# Patient Record
Sex: Female | Born: 1965 | Race: White | Hispanic: No | Marital: Married
Health system: Midwestern US, Community
[De-identification: ages and names within clinical notes are randomized; demographics above are authoritative.]

## PROBLEM LIST (undated history)

## (undated) ENCOUNTER — Emergency Department (HOSPITAL_BASED_OUTPATIENT_CLINIC_OR_DEPARTMENT_OTHER): Admission: EM | Discharge status: Against Medical Advice | Payer: BC Managed Care – PPO

## (undated) DIAGNOSIS — Z170 Estrogen receptor positive status [ER+]: Secondary | ICD-10-CM

## (undated) DIAGNOSIS — M79671 Pain in right foot: Secondary | ICD-10-CM

## (undated) DIAGNOSIS — C50111 Malignant neoplasm of central portion of right female breast: Secondary | ICD-10-CM

## (undated) DIAGNOSIS — M79672 Pain in left foot: Principal | ICD-10-CM

## (undated) DIAGNOSIS — Z1211 Encounter for screening for malignant neoplasm of colon: Secondary | ICD-10-CM

## (undated) DIAGNOSIS — Z79811 Long term (current) use of aromatase inhibitors: Secondary | ICD-10-CM

## (undated) DIAGNOSIS — Z0000 Encounter for general adult medical examination without abnormal findings: Secondary | ICD-10-CM

## (undated) DIAGNOSIS — C50911 Malignant neoplasm of unspecified site of right female breast: Principal | ICD-10-CM

## (undated) DIAGNOSIS — E034 Atrophy of thyroid (acquired): Secondary | ICD-10-CM

## (undated) DIAGNOSIS — N63 Unspecified lump in unspecified breast: Secondary | ICD-10-CM

## (undated) DIAGNOSIS — R059 Cough, unspecified: Secondary | ICD-10-CM

## (undated) DIAGNOSIS — N950 Postmenopausal bleeding: Secondary | ICD-10-CM

## (undated) DIAGNOSIS — L0291 Cutaneous abscess, unspecified: Secondary | ICD-10-CM

## (undated) DIAGNOSIS — K219 Gastro-esophageal reflux disease without esophagitis: Secondary | ICD-10-CM

## (undated) DIAGNOSIS — E782 Mixed hyperlipidemia: Secondary | ICD-10-CM

## (undated) DIAGNOSIS — R0681 Apnea, not elsewhere classified: Secondary | ICD-10-CM

## (undated) DIAGNOSIS — F411 Generalized anxiety disorder: Secondary | ICD-10-CM

## (undated) HISTORY — DX: Apnea, not elsewhere classified: R06.81

## (undated) HISTORY — DX: Generalized anxiety disorder: F41.1

## (undated) HISTORY — DX: Mixed hyperlipidemia: E78.2

## (undated) HISTORY — PX: HERNIA REPAIR: SHX51

## (undated) HISTORY — DX: Gastro-esophageal reflux disease without esophagitis: K21.9

## (undated) MED FILL — TAMOXIFEN CITRATE 20MG TABS: 20 MG | 30 days supply | Qty: 30 | Fill #2 | Status: AC

## (undated) MED FILL — ANASTROZOLE 1MG TABS: 1 MG | 30 days supply | Qty: 30 | Fill #1 | Status: AC

## (undated) MED FILL — TAMOXIFEN CITRATE 20MG TABS: 20 MG | 90 days supply | Qty: 90 | Fill #3 | Status: AC

## (undated) MED FILL — TAMOXIFEN CITRATE 20MG TABS: 20 MG | 90 days supply | Qty: 90 | Fill #1 | Status: AC

## (undated) MED FILL — TAMOXIFEN CITRATE 20MG TABS: 20 MG | 90 days supply | Qty: 90 | Fill #0 | Status: AC

## (undated) MED FILL — TAMOXIFEN CITRATE 20MG TABS: 20 MG | 90 days supply | Qty: 90 | Fill #2 | Status: AC

## (undated) MED FILL — ANASTROZOLE 1MG TABS: 1 MG | 30 days supply | Qty: 30 | Fill #0 | Status: AC

---

## 2010-05-24 ENCOUNTER — Emergency Department (HOSPITAL_COMMUNITY)
Admission: EM | Admit: 2010-05-24 | Discharge: 2010-05-24 | Payer: Self-pay | Source: Home / Self Care | Admitting: Emergency Medicine

## 2010-06-20 ENCOUNTER — Emergency Department (HOSPITAL_BASED_OUTPATIENT_CLINIC_OR_DEPARTMENT_OTHER)
Admission: EM | Admit: 2010-06-20 | Discharge: 2010-06-20 | Payer: Self-pay | Source: Home / Self Care | Admitting: Emergency Medicine

## 2010-06-28 LAB — CBC
HCT: 36.1 % (ref 36.0–46.0)
Hemoglobin: 12.5 g/dL (ref 12.0–15.0)
MCH: 30 pg (ref 26.0–34.0)
MCHC: 34.6 g/dL (ref 30.0–36.0)
MCV: 86.6 fL (ref 78.0–100.0)
Platelets: 224 10*3/uL (ref 150–400)
RBC: 4.17 MIL/uL (ref 3.87–5.11)
RDW: 12.7 % (ref 11.5–15.5)
WBC: 5.1 10*3/uL (ref 4.0–10.5)

## 2010-06-28 LAB — COMPREHENSIVE METABOLIC PANEL
ALT: 26 U/L (ref 0–35)
AST: 28 U/L (ref 0–37)
Albumin: 4.2 g/dL (ref 3.5–5.2)
Alkaline Phosphatase: 60 U/L (ref 39–117)
BUN: 11 mg/dL (ref 6–23)
CO2: 24 mEq/L (ref 19–32)
Calcium: 9.2 mg/dL (ref 8.4–10.5)
Chloride: 107 mEq/L (ref 96–112)
Creatinine, Ser: 0.8 mg/dL (ref 0.4–1.2)
GFR calc Af Amer: >60 mL/min (ref >60)
GFR calc non Af Amer: >60 mL/min (ref >60)
Glucose, Bld: 83 mg/dL (ref 70–99)
Potassium: 3.7 mEq/L (ref 3.5–5.1)
Sodium: 141 mEq/L (ref 135–145)
Total Bilirubin: 0.6 mg/dL (ref 0.3–1.2)
Total Protein: 8 g/dL (ref 6.0–8.3)

## 2010-06-28 LAB — POCT CARDIAC MARKERS
CKMB, poc: <1 ng/mL — ABNORMAL LOW (ref 1.0–8.0)
Myoglobin, poc: 50.5 ng/mL (ref 12–200)
Troponin i, poc: <0.05 ng/mL (ref 0.00–0.09)

## 2010-06-28 LAB — D-DIMER, QUANTITATIVE: D-Dimer, Quant: 0.35 ug/mL-FEU (ref 0.00–0.48)

## 2010-08-24 LAB — URINALYSIS, ROUTINE W REFLEX MICROSCOPIC
Bilirubin Urine: NEGATIVE
Glucose, UA: NEGATIVE mg/dL
Hgb urine dipstick: NEGATIVE
Nitrite: NEGATIVE
Protein, ur: NEGATIVE mg/dL
Specific Gravity, Urine: 1.027 (ref 1.005–1.030)
Urobilinogen, UA: 0.2 mg/dL (ref 0.0–1.0)
pH: 6 (ref 5.0–8.0)

## 2010-08-24 LAB — POCT PREGNANCY, URINE: Preg Test, Ur: NEGATIVE

## 2011-12-08 IMAGING — CR DG CHEST 1V PORT
1 series · 1 of 1 positions shown · non-contrast
Comparison: None.

CLINICAL DATA: 44-year-old female with chest pain, chest tightness.

PORTABLE CHEST - 1 VIEW

[view not recorded]
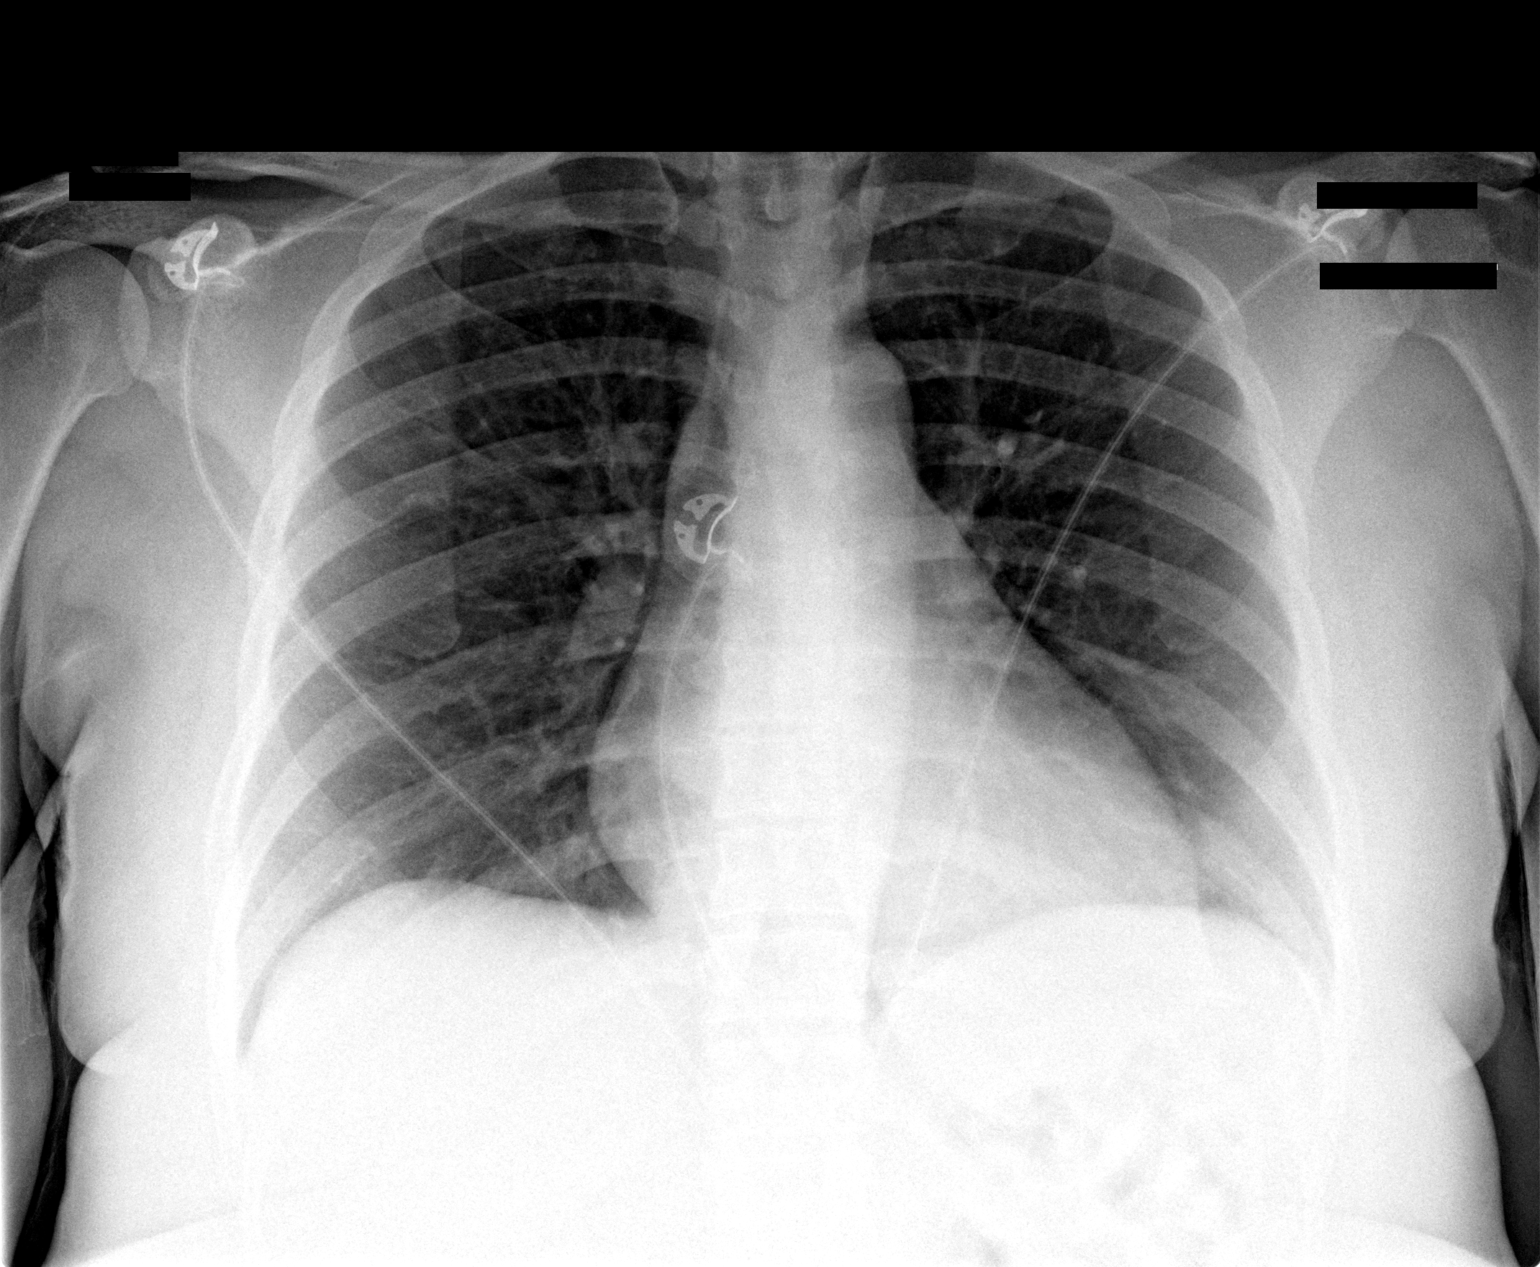

[1 of 1 positions shown; findings below may reference images not displayed]

FINDINGS: Portable upright AP view 7949 hours.  Normal lung
volumes. Normal cardiac size and mediastinal contours.  Visualized
tracheal air column is within normal limits.  Allowing for portable
technique, the lungs are clear.  No pneumothorax or effusion.
IMPRESSION: Negative, no acute cardiopulmonary abnormality.

## 2012-09-19 ENCOUNTER — Emergency Department: Payer: Self-pay | Admitting: Emergency Medicine

## 2012-09-20 LAB — CBC
HCT: 37.8 %
HGB: 13 g/dL
MCH: 31 pg
MCHC: 34.3 g/dL
MCV: 90 fL
Platelet: 229 x10 3/mm 3
RBC: 4.19 X10 6/mm 3
RDW: 13 %
WBC: 9.1 x10 3/mm 3

## 2012-09-23 ENCOUNTER — Emergency Department (HOSPITAL_BASED_OUTPATIENT_CLINIC_OR_DEPARTMENT_OTHER)
Admission: EM | Admit: 2012-09-23 | Discharge: 2012-09-23 | Disposition: A | Discharge status: Home / Self Care | Payer: BC Managed Care – PPO | Attending: Emergency Medicine | Admitting: Emergency Medicine

## 2012-09-23 ENCOUNTER — Encounter (HOSPITAL_BASED_OUTPATIENT_CLINIC_OR_DEPARTMENT_OTHER): Payer: Self-pay

## 2012-09-23 DIAGNOSIS — F17200 Nicotine dependence, unspecified, uncomplicated: Secondary | ICD-10-CM | POA: Insufficient documentation

## 2012-09-23 DIAGNOSIS — R5381 Other malaise: Secondary | ICD-10-CM | POA: Insufficient documentation

## 2012-09-23 DIAGNOSIS — L0390 Cellulitis, unspecified: Secondary | ICD-10-CM

## 2012-09-23 DIAGNOSIS — R5383 Other fatigue: Secondary | ICD-10-CM | POA: Insufficient documentation

## 2012-09-23 DIAGNOSIS — N764 Abscess of vulva: Secondary | ICD-10-CM | POA: Insufficient documentation

## 2012-09-23 HISTORY — DX: Cutaneous abscess, unspecified: L02.91

## 2012-09-23 LAB — BASIC METABOLIC PANEL
BUN: 12 mg/dL (ref 6–23)
CO2: 28 mEq/L (ref 19–32)
Calcium: 9.4 mg/dL (ref 8.4–10.5)
Chloride: 103 mEq/L (ref 96–112)
Creatinine, Ser: 0.9 mg/dL (ref 0.50–1.10)
GFR calc Af Amer: 87 mL/min — ABNORMAL LOW (ref >90)
GFR calc non Af Amer: 75 mL/min — ABNORMAL LOW (ref >90)
Glucose, Bld: 86 mg/dL (ref 70–99)
Potassium: 4 mEq/L (ref 3.5–5.1)
Sodium: 139 mEq/L (ref 135–145)

## 2012-09-23 LAB — CBC WITH DIFFERENTIAL/PLATELET
Basophils Absolute: 0 10*3/uL (ref 0.0–0.1)
Basophils Relative: 1 % (ref 0–1)
Eosinophils Absolute: 0.1 10*3/uL (ref 0.0–0.7)
Eosinophils Relative: 3 % (ref 0–5)
HCT: 38.9 % (ref 36.0–46.0)
Hemoglobin: 13.6 g/dL (ref 12.0–15.0)
Lymphocytes Relative: 29 % (ref 12–46)
Lymphs Abs: 1.2 10*3/uL (ref 0.7–4.0)
MCH: 30.8 pg (ref 26.0–34.0)
MCHC: 35 g/dL (ref 30.0–36.0)
MCV: 88 fL (ref 78.0–100.0)
Monocytes Absolute: 0.4 10*3/uL (ref 0.1–1.0)
Monocytes Relative: 10 % (ref 3–12)
Neutro Abs: 2.4 10*3/uL (ref 1.7–7.7)
Neutrophils Relative %: 58 % (ref 43–77)
Platelets: 260 10*3/uL (ref 150–400)
RBC: 4.42 MIL/uL (ref 3.87–5.11)
RDW: 12.4 % (ref 11.5–15.5)
WBC: 4.1 10*3/uL (ref 4.0–10.5)

## 2012-09-23 MED ORDER — SULFAMETHOXAZOLE-TRIMETHOPRIM 800-160 MG PO TABS: 1.0000 | ORAL_TABLET | Freq: Two times a day (BID) | ORAL | Status: DC

## 2012-09-23 NOTE — ED Provider Notes (Signed)
History     CSN: [id number]  Arrival date & time 09/23/12  1151   First MD Initiated Contact with Patient 09/23/12 1205      Chief Complaint  Patient presents with  . Abscess    (Consider location/radiation/quality/duration/timing/severity/associated sxs/prior treatment) HPI Comments: Patient is a 47 year old female who presents with left labial swelling for the past 2 days. Patient reports a history of a labial abscess that was drained 5 days ago at [facility]. Patient's reports having packing in the wound which she removed. She thinks there may be a piece in the wound because she feels a "hard knot" where the abscess was drained. Symptoms started gradually and progressively worsened since the onset. Patient denies pain. She reports some green drainage from the incision area. She reports associated weakness. Patient denies fever, abdominal pain, urinary symptoms. Patient did not get antibiotics at her initial visit.    Past Medical History  Diagnosis Date  . Abscess     to genitals    Past Surgical History  Procedure Laterality Date  . Hernia repair      History reviewed. No pertinent family history.  History  Substance Use Topics  . Smoking status: Current Every Day Smoker -- 0.50 packs/day    Types: Cigarettes  . Smokeless tobacco: Never Used  . Alcohol Use: Yes    OB History   Grav Para Term Preterm Abortions TAB SAB Ect Mult Living                  Review of Systems  Skin: Positive for wound.  All other systems reviewed and are negative.    Allergies  Penicillins  Home Medications  No current outpatient prescriptions on file.  BP 140/91  Pulse 88  Temp(Src) 98.6 F (37 C) (Oral)  Resp 18  Ht 5\' 6"  (1.676 m)  Wt 210 lb (95.255 kg)  BMI 33.91 kg/m2  SpO2 100%  LMP 09/12/2012  Physical Exam  Nursing note and vitals reviewed. Constitutional: She appears well-developed and well-nourished. No distress.  HENT:  Head: Normocephalic and atraumatic.   Eyes: Conjunctivae are normal.  Neck: Normal range of motion.  Cardiovascular: Normal rate and regular rhythm.  Exam reveals no gallop and no friction rub.   No murmur heard. Pulmonary/Chest: Effort normal and breath sounds normal. She has no wheezes. She has no rales. She exhibits no tenderness.  Abdominal: Soft. She exhibits no distension. There is no tenderness. There is no rebound and no guarding.  Genitourinary:  Area of induration to left labia majora. Incision from previous I&D visible and without drainage from site. Mild tenderness to palpation of area of induration.   Musculoskeletal: Normal range of motion.  Neurological: She is alert.  Speech is goal-oriented. Moves limbs without ataxia.   Skin: Skin is warm and dry.  Psychiatric: She has a normal mood and affect. Her behavior is normal.    ED Course  Procedures (including critical care time)  Labs Reviewed  BASIC METABOLIC PANEL - Abnormal; Notable for the following:    GFR calc non Af Amer 75 (*)    GFR calc Af Amer 87 (*)    All other components within normal limits  CBC WITH DIFFERENTIAL   No results found.   1. Cellulitis   2. Fatigue       MDM  12:24 PM Patient likely has cellulitis around the previous abscess that was drained 5 days ago. Labs pending for patient's fatigue.   1:12 PM Labs  unremarkable. Patient will be discharged with out further evaluation. Vitals stable and patient afebrile. Patient instructed to return to the ED with worsening or concerning symptoms.      Emilia Beck, PA-C 09/23/12 1320

## 2012-09-23 NOTE — ED Notes (Signed)
Pt states she has abscess to genitals and states that she had packing placed and that she was told to take some out everyday, but thinks that some remains in place.  Pt states that there is green drainage coming from the area.  C/o generalized weakness and malaise.

## 2012-09-25 NOTE — ED Provider Notes (Signed)
Medical screening examination/treatment/procedure(s) were performed by non-physician practitioner and as supervising physician I was immediately available for consultation/collaboration.   Gwyneth Sprout, MD 09/25/12 2129

## 2013-05-24 ENCOUNTER — Encounter (HOSPITAL_BASED_OUTPATIENT_CLINIC_OR_DEPARTMENT_OTHER): Payer: Self-pay | Admitting: Emergency Medicine

## 2013-05-24 ENCOUNTER — Emergency Department (HOSPITAL_BASED_OUTPATIENT_CLINIC_OR_DEPARTMENT_OTHER)
Admission: EM | Admit: 2013-05-24 | Discharge: 2013-05-24 | Disposition: A | Discharge status: Home / Self Care | Payer: BC Managed Care – PPO | Attending: Emergency Medicine | Admitting: Emergency Medicine

## 2013-05-24 DIAGNOSIS — K645 Perianal venous thrombosis: Secondary | ICD-10-CM | POA: Insufficient documentation

## 2013-05-24 DIAGNOSIS — Z880 Allergy status to penicillin: Secondary | ICD-10-CM | POA: Insufficient documentation

## 2013-05-24 DIAGNOSIS — Z8742 Personal history of other diseases of the female genital tract: Secondary | ICD-10-CM | POA: Insufficient documentation

## 2013-05-24 DIAGNOSIS — K649 Unspecified hemorrhoids: Secondary | ICD-10-CM

## 2013-05-24 DIAGNOSIS — Z792 Long term (current) use of antibiotics: Secondary | ICD-10-CM | POA: Insufficient documentation

## 2013-05-24 DIAGNOSIS — F17200 Nicotine dependence, unspecified, uncomplicated: Secondary | ICD-10-CM | POA: Insufficient documentation

## 2013-05-24 MED ORDER — HYDROCORTISONE ACETATE 25 MG RE SUPP: 25.0000 mg | Freq: Two times a day (BID) | RECTAL | Status: DC

## 2013-05-24 NOTE — ED Provider Notes (Signed)
CSN: [id number]     Arrival date & time 05/24/13  1144 History   First MD Initiated Contact with Patient 05/24/13 1236     Chief Complaint  Patient presents with  . Hemorrhoids   (Consider location/radiation/quality/duration/timing/severity/associated sxs/prior Treatment) HPI Comments: Ms. Schexnider is a 47 year old woman with a PMH of hemorrhoids since the birth of her first child over 20 years ago.  She normally manages them conservatively at home.  However, recently she says she has been unable to push them back in.  She notes an episode of straining a few days ago and has been experiencing anal itching and pain since then.  OTC medications and sitz baths have not helped.  She called a surgeon to set up an appointment for evaluation.  However, she was informed that she would need a referral by her PCP.  She does not have a PCP so she came to the ED for a referral.   Past Medical History  Diagnosis Date  . Abscess     to genitals   Past Surgical History  Procedure Laterality Date  . Hernia repair     History reviewed. No pertinent family history. History  Substance Use Topics  . Smoking status: Current Every Day Smoker -- 0.50 packs/day    Types: Cigarettes  . Smokeless tobacco: Never Used  . Alcohol Use: No   OB History   Grav Para Term Preterm Abortions TAB SAB Ect Mult Living                 Review of Systems  Constitutional: Negative for appetite change and fatigue.  Respiratory: Negative for shortness of breath.   Cardiovascular: Negative for chest pain.  Gastrointestinal: Negative for nausea, vomiting, abdominal pain, diarrhea and blood in stool.       Occasional blood on toilet paper  Genitourinary: Negative for dysuria.  Neurological: Negative for headaches.    Allergies  Penicillins  Home Medications   Current Outpatient Rx  Name  Route  Sig  Dispense  Refill  . sulfamethoxazole-trimethoprim (SEPTRA DS) 800-160 MG per tablet   Oral   Take 1 tablet by mouth  every 12 (twelve) hours.   20 tablet   0    BP 132/85  Pulse 77  Temp(Src) 98.3 F (36.8 C) (Oral)  Resp 16  Ht 5\' 6"  (1.676 m)  Wt 200 lb (90.719 kg)  BMI 32.30 kg/m2  SpO2 100%  LMP 05/15/2013 Physical Exam  Constitutional: She is oriented to person, place, and time. She appears well-developed and well-nourished. No distress.  HENT:  Head: Normocephalic and atraumatic.  Mouth/Throat: Oropharynx is clear and moist. No oropharyngeal exudate.  Eyes: EOM are normal. Pupils are equal, round, and reactive to light.  Neck: Neck supple.  Cardiovascular: Normal rate, regular rhythm and normal heart sounds.   Pulmonary/Chest: Effort normal and breath sounds normal. No respiratory distress. She has no wheezes. She has no rales.  Abdominal: Soft. Bowel sounds are normal. She exhibits no distension. There is no tenderness.  Genitourinary:  + thrombosed external hemorrhoid  Musculoskeletal: Normal range of motion.  Neurological: She is alert and oriented to person, place, and time.  Skin: Skin is warm. She is not diaphoretic.  Psychiatric: She has a normal mood and affect. Her behavior is normal.    ED Course  Procedures (including critical care time) Labs Review Labs Reviewed - No data to display Imaging Review No results found.  EKG Interpretation   None  MDM  47 year old with PMH of hemorrhoids presenting with complaint of hemorrhoids.  She is usually able to manage on her own but would like definitive surgical treatment.  She has called a Careers adviser but they will not see her without a referral.  She has no PCP so she has come to the ED for evaluation and referral.  Thrombosed external hemorrhoid evident on exam.  Patient declines clot removal in the ED and would prefer referral to specialist for definitive treatment.  She is in stable condition for discharge home with rx for hydrocortisone suppositories (for no more than 1 week) and she can continue sitz baths for symptom  relief as well.  I attempted to reach the surgeon's office where she would like to be referred.  The office was closed so I have included the surgeon's contact information on her discharge paperwork.  The patient will call next week for appointment.    Evelena Peat, DO 05/24/13 1605

## 2013-05-24 NOTE — ED Notes (Signed)
Pt c/o painful Hemorids x 2 days

## 2013-05-30 ENCOUNTER — Ambulatory Visit (INDEPENDENT_AMBULATORY_CARE_PROVIDER_SITE_OTHER): Payer: BC Managed Care – PPO | Admitting: Surgery

## 2013-05-30 ENCOUNTER — Encounter (INDEPENDENT_AMBULATORY_CARE_PROVIDER_SITE_OTHER): Payer: Self-pay | Admitting: Surgery

## 2013-05-30 VITALS — BP 142/86 | HR 71 | Temp 97.3°F | Resp 16 | Ht 66.0 in | Wt 217.6 lb

## 2013-05-30 DIAGNOSIS — K649 Unspecified hemorrhoids: Secondary | ICD-10-CM

## 2013-05-30 MED ORDER — HYDROCORTISONE ACE-PRAMOXINE 1-1 % RE FOAM: 1.0000 | Freq: Two times a day (BID) | RECTAL | Status: DC

## 2013-05-30 NOTE — Patient Instructions (Signed)
Hemorrhoids Hemorrhoids are swollen veins around the rectum or anus. There are two types of hemorrhoids:   Internal hemorrhoids. These occur in the veins just inside the rectum. They may poke through to the outside and become irritated and painful.  External hemorrhoids. These occur in the veins outside the anus and can be felt as a painful swelling or hard lump near the anus. CAUSES  Pregnancy.   Obesity.   Constipation or diarrhea.   Straining to have a bowel movement.   Sitting for long periods on the toilet.  Heavy lifting or other activity that caused you to strain.  Anal intercourse. SYMPTOMS   Pain.   Anal itching or irritation.   Rectal bleeding.   Fecal leakage.   Anal swelling.   One or more lumps around the anus.  DIAGNOSIS  Your caregiver may be able to diagnose hemorrhoids by visual examination. Other examinations or tests that may be performed include:   Examination of the rectal area with a gloved hand (digital rectal exam).   Examination of anal canal using a small tube (scope).   A blood test if you have lost a significant amount of blood.  A test to look inside the colon (sigmoidoscopy or colonoscopy). TREATMENT Most hemorrhoids can be treated at home. However, if symptoms do not seem to be getting better or if you have a lot of rectal bleeding, your caregiver may perform a procedure to help make the hemorrhoids get smaller or remove them completely. Possible treatments include:   Placing a rubber band at the base of the hemorrhoid to cut off the circulation (rubber band ligation).   Injecting a chemical to shrink the hemorrhoid (sclerotherapy).   Using a tool to burn the hemorrhoid (infrared light therapy).   Surgically removing the hemorrhoid (hemorrhoidectomy).   Stapling the hemorrhoid to block blood flow to the tissue (hemorrhoid stapling).  HOME CARE INSTRUCTIONS   Eat foods with fiber, such as whole grains, beans,  nuts, fruits, and vegetables. Ask your doctor about taking products with added fiber in them (fibersupplements).  Increase fluid intake. Drink enough water and fluids to keep your urine clear or pale yellow.   Exercise regularly.   Go to the bathroom when you have the urge to have a bowel movement. Do not wait.   Avoid straining to have bowel movements.   Keep the anal area dry and clean. Use wet toilet paper or moist towelettes after a bowel movement.   Medicated creams and suppositories may be used or applied as directed.   Only take over-the-counter or prescription medicines as directed by your caregiver.   Take warm sitz baths for 15 20 minutes, 3 4 times a day to ease pain and discomfort.   Place ice packs on the hemorrhoids if they are tender and swollen. Using ice packs between sitz baths may be helpful.   Put ice in a plastic bag.   Place a towel between your skin and the bag.   Leave the ice on for 15 20 minutes, 3 4 times a day.   Do not use a donut-shaped pillow or sit on the toilet for long periods. This increases blood pooling and pain.  SEEK MEDICAL CARE IF:  You have increasing pain and swelling that is not controlled by treatment or medicine.  You have uncontrolled bleeding.  You have difficulty or you are unable to have a bowel movement.  You have pain or inflammation outside the area of the hemorrhoids. MAKE SURE YOU:    Understand these instructions.  Will watch your condition.  Will get help right away if you are not doing well or get worse. Document Released: 05/27/2000 Document Revised: 05/16/2012 Document Reviewed: 04/03/2012 Exit[employer] Patient Information 2014 [employer].  

## 2013-05-30 NOTE — ED Provider Notes (Signed)
I saw and evaluated the patient, reviewed the resident's note and I agree with the findings and plan.   .Face to face Exam:  General:  Awake HEENT:  Atraumatic Resp:  Normal effort Abd:  Nondistended Neuro:No focal weakness   Offered to I&D thrombosed external hemorrhoid, but patient declined the offer.  Nelia Shi, MD 05/30/13 [phone]

## 2013-05-30 NOTE — Progress Notes (Signed)
Patient ID: Caitlin Butler, female   DOB: 10-Jun-1966, 47 y.o.   MRN: [id number]  Chief Complaint  Patient presents with  . Rectal Problems    evaluate thrombosed hems    HPI Caitlin Butler is a 47 y.o. female.  The patient's request of Dr. Radford Pax for symptomatic hemorrhoid disease. Patient has 20 year history of hemorrhoid disease since the birth of her last child. Patient having more prolapse and discomfort and sought care in the emergency room. He was told she had a thrombosed hemorrhoid. Patient with history of bleeding and itching. HPI  Past Medical History  Diagnosis Date  . Abscess     to genitals    Past Surgical History  Procedure Laterality Date  . Hernia repair      No family history on file.  Social History History  Substance Use Topics  . Smoking status: Current Every Day Smoker -- 0.50 packs/day    Types: Cigarettes  . Smokeless tobacco: Never Used  . Alcohol Use: No    Allergies  Allergen Reactions  . Penicillins     Childhood rxn     Current Outpatient Prescriptions  Medication Sig Dispense Refill  . hydrocortisone-pramoxine (PROCTOFOAM HC) rectal foam Place 1 applicator rectally 2 (two) times daily.  10 g  0   No current facility-administered medications for this visit.    Review of Systems Review of Systems  Constitutional: Negative for fever, chills and unexpected weight change.  HENT: Negative for congestion, hearing loss, sore throat, trouble swallowing and voice change.   Eyes: Negative for visual disturbance.  Respiratory: Negative for cough and wheezing.   Cardiovascular: Negative for chest pain, palpitations and leg swelling.  Gastrointestinal: Positive for anal bleeding and rectal pain. Negative for nausea, vomiting, abdominal pain, diarrhea, constipation, blood in stool and abdominal distention.  Genitourinary: Negative for hematuria, vaginal bleeding and difficulty urinating.  Musculoskeletal: Negative for arthralgias.  Skin: Negative for  rash and wound.  Neurological: Negative for seizures, syncope and headaches.  Hematological: Negative for adenopathy. Does not bruise/bleed easily.  Psychiatric/Behavioral: Negative for confusion.    Blood pressure 142/86, pulse 71, temperature 97.3 F (36.3 C), temperature source Temporal, resp. rate 16, height 5\' 6"  (1.676 m), weight 217 lb 9.6 oz (98.703 kg), last menstrual period 05/15/2013.  Physical Exam Physical Exam  Constitutional: She appears well-developed and well-nourished.  HENT:  Head: Normocephalic.  Eyes: EOM are normal. Pupils are equal, round, and reactive to light.  Genitourinary:     Psychiatric: She has a normal mood and affect. Her behavior is normal. Judgment and thought content normal.    Data Reviewed ED notes  Assessment    Grade 3 prolapsed 2 column disease    Plan    Grade 3 two column internal hemorrhoid disease.patient tolerated sclerotherapy well. Many hemorrhoidectomy if this fails. Post procedure care instructions given. Return if symptoms do not improve or wishes further therapy in the future. High fiber direct recommended.        Masen Salvas A. 05/30/2013, 3:01 PM

## 2013-06-19 ENCOUNTER — Ambulatory Visit (INDEPENDENT_AMBULATORY_CARE_PROVIDER_SITE_OTHER): Payer: BC Managed Care – PPO | Admitting: General Surgery

## 2013-07-31 ENCOUNTER — Encounter (INDEPENDENT_AMBULATORY_CARE_PROVIDER_SITE_OTHER): Payer: Self-pay | Admitting: Surgery

## 2014-09-05 ENCOUNTER — Ambulatory Visit: Payer: Self-pay | Admitting: Family Medicine

## 2017-07-25 ENCOUNTER — Ambulatory Visit: Admit: 2017-07-25 | Payer: PRIVATE HEALTH INSURANCE | Primary: Family Medicine

## 2017-07-25 DIAGNOSIS — J069 Acute upper respiratory infection, unspecified: Secondary | ICD-10-CM

## 2017-07-25 LAB — AMB POC RAPID INFLUENZA TEST
Influenza A Ag POC: NEGATIVE Pos/Neg
Influenza B Ag POC: NEGATIVE Pos/Neg

## 2017-07-25 LAB — AMB POC RAPID STREP TEST: Group A Strep Ag: NEGATIVE

## 2017-07-25 NOTE — Patient Instructions (Addendum)
Delsym 10 ml By mouth every 12 hrs as needed for cough  Zarbees Immune Support Per pk instructions       Influenza (Flu): Care Instructions  Your Care Instructions    Influenza (flu) is an infection in the lungs and breathing passages. It is caused by the influenza virus. There are different strains, or types, of the flu virus from year to year. Unlike the common cold, the flu comes on suddenly and the symptoms, such as a cough, congestion, fever, chills, fatigue, aches, and pains, are more severe. These symptoms may last up to 10 days. Although the flu can make you feel very sick, it usually doesn't cause serious health problems.  Home treatment is usually all you need for flu symptoms. But your doctor may prescribe antiviral medicine to prevent other health problems, such as pneumonia, from developing. Older people and those who have a long-term health condition, such as lung disease, are most at risk for having pneumonia or other health problems.  Follow-up care is a key part of your treatment and safety. Be sure to make and go to all appointments, and call your doctor if you are having problems. It's also a good idea to know your test results and keep a list of the medicines you take.  How can you care for yourself at home?  ?? Get plenty of rest.  ?? Drink plenty of fluids, enough so that your urine is light yellow or clear like water. If you have kidney, heart, or liver disease and have to limit fluids, talk with your doctor before you increase the amount of fluids you drink.  ?? Take an over-the-counter pain medicine if needed, such as acetaminophen (Tylenol), ibuprofen (Advil, Motrin), or naproxen (Aleve), to relieve fever, headache, and muscle aches. Read and follow all instructions on the label. No one younger than 20 should take aspirin. It has been linked to Reye syndrome, a serious illness.  ?? Do not smoke. Smoking can make the flu worse. If you need help quitting,  talk to your doctor about stop-smoking programs and medicines. These can increase your chances of quitting for good.  ?? Breathe moist air from a hot shower or from a sink filled with hot water to help clear a stuffy nose.  ?? Before you use cough and cold medicines, check the label. These medicines may not be safe for young children or for people with certain health problems.  ?? If the skin around your nose and lips becomes sore, put some petroleum jelly on the area.  ?? To ease coughing:  ? Drink fluids to soothe a scratchy throat.  ? Suck on cough drops or plain hard candy.  ? Take an over-the-counter cough medicine that contains dextromethorphan to help you get some sleep. Read and follow all instructions on the label.  ? Raise your head at night with an extra pillow. This may help you rest if coughing keeps you awake.  ?? Take any prescribed medicine exactly as directed. Call your doctor if you think you are having a problem with your medicine.  To avoid spreading the flu  ?? Wash your hands regularly, and keep your hands away from your face.  ?? Stay home from school, work, and other public places until you are feeling better and your fever has been gone for at least 24 hours. The fever needs to have gone away on its own without the help of medicine.  ?? Ask people living with you to talk to  their doctors about preventing the flu. They may get antiviral medicine to keep from getting the flu from you.  ?? To prevent the flu in the future, get a flu vaccine every fall. Encourage people living with you to get the vaccine.  ?? Cover your mouth when you cough or sneeze.  When should you call for help?  Call 911 anytime you think you may need emergency care. For example, call if:  ?? ?? You have severe trouble breathing.   ??Call your doctor now or seek immediate medical care if:  ?? ?? You have new or worse trouble breathing.   ?? ?? You seem to be getting much sicker.   ?? ?? You feel very sleepy or confused.    ?? ?? You have a new or higher fever.   ?? ?? You get a new rash.   ??Watch closely for changes in your health, and be sure to contact your doctor if:  ?? ?? You begin to get better and then get worse.   ?? ?? You are not getting better after 1 week.   Where can you learn more?  Go to [email or web address].  Enter [phone] in the search box to learn more about "Influenza (Flu): Care Instructions."  Current as of: February 15, 2017  Content Version: 11.9  ?? 2006-2018 [employer]. Care instructions adapted under license by [employer] (which disclaims liability or warranty for this information). If you have questions about a medical condition or this instruction, always ask your healthcare professional. [place] any warranty or liability for your use of this information.

## 2017-07-25 NOTE — Progress Notes (Signed)
PROGRESS NOTE    SUBJECTIVE:   Jill Kaufman is a 52 y.o. female seen for a visit regarding Sore Throat (Patient complains of a sore throat, runny nose, and body aches. Symptoms have been going on for 2 days. )    Body aches, mild ear pain, bad sore throat, fatigue, runny nose, mild cough - with clear mucus sometimes.   Symptoms started all of a sudden yesterday.   Has had flu exposure.   Did get her flu shot.   She has been taking dayquil / Nyquil - which have helped.          Review of Systems   Constitution: Positive for malaise/fatigue. Negative for chills and fever.        Body aches   HENT: Positive for congestion, ear pain and sore throat. Negative for hoarse voice.    Cardiovascular: Negative for chest pain.   Respiratory: Positive for cough and sputum production.    Gastrointestinal: Negative for diarrhea and nausea.       Current Outpatient Medications   Medication Sig Dispense Refill   ??? pitavastatin calcium (LIVALO PO) Take  by mouth.     ??? levothyroxine (SYNTHROID) 75 mcg tablet Take  by mouth Daily (before breakfast).       Allergies   Allergen Reactions   ??? Codeine Vertigo     There is no problem list on file for this patient.    Past Medical History:   Diagnosis Date   ??? Thyroid disease 06/14/2011    Hypothyroidism      No past surgical history on file.  No family history on file.  Social History     Tobacco Use   ??? Smoking status: Not on file   Substance Use Topics   ??? Alcohol use: Not on file       Past Medical History, Past Surgical History, Family history, Social History, and Medications were all reviewed with the patient today and updated as necessary.     OBJECTIVE:  Visit Vitals  BP 112/80 (BP 1 Location: Left arm, BP Patient Position: Sitting)   Pulse 73   Temp 98.4 ??F (36.9 ??C) (Oral)   Resp 18   SpO2 99%        Physical Exam   Constitutional: She is oriented to person, place, and time. She appears well-developed and well-nourished.   HENT:   Head: Normocephalic and atraumatic.    Left Ear: External ear normal.   Mouth/Throat: No oropharyngeal exudate.   Right ear with fullness  Oropharynx with swelling (mild). Streaking, and erythema  Nasal passage with mild swelling and erythema     Eyes: Conjunctivae are normal. Right eye exhibits discharge. Left eye exhibits discharge. No scleral icterus.   Clear discharge from both eyes   Neck: Normal range of motion. Neck supple.   Cardiovascular: Normal rate and regular rhythm.   Pulmonary/Chest: Effort normal and breath sounds normal. No respiratory distress. She has no wheezes. She has no rales.   Lymphadenopathy:     She has no cervical adenopathy.   Neurological: She is alert and oriented to person, place, and time.   Skin: Skin is warm and dry.   Psychiatric: She has a normal mood and affect.   Vitals reviewed.    POC Strep Grp A (-)  POC Flu A (-) B (-)  ASSESSMENT and PLAN    Diagnoses and all orders for this visit:    1. URI, acute  -  AMB POC RAPID STREP TEST  -     AMB POC RAPID INFLUENZA TEST      Flu Vs Flu like illness  RTC if symptoms not improving or worsening.   Offered pt a work excuse, she preferred to stay at work. 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     Delsym 10 ml By mouth every 12 hrs as needed for cough  Zarbees Immune Support Per pk instructions       Influenza (Flu): Care Instructions  Your Care Instructions    Influenza (flu) is an infection in the lungs and breathing passages. It is caused by the influenza virus. There are different strains, or types, of the flu virus from year to year. Unlike the common cold, the flu comes on suddenly and the symptoms, such as a cough, congestion, fever, chills, fatigue, aches, and pains, are more severe. These symptoms may last up to 10 days. Although the flu can make you feel very sick, it usually doesn't cause serious health problems.  Home treatment is usually all you need for flu symptoms. But your doctor  may prescribe antiviral medicine to prevent other health problems, such as pneumonia, from developing. Older people and those who have a long-term health condition, such as lung disease, are most at risk for having pneumonia or other health problems.  Follow-up care is a key part of your treatment and safety. Be sure to make and go to all appointments, and call your doctor if you are having problems. It's also a good idea to know your test results and keep a list of the medicines you take.  How can you care for yourself at home?  ?? Get plenty of rest.  ?? Drink plenty of fluids, enough so that your urine is light yellow or clear like water. If you have kidney, heart, or liver disease and have to limit fluids, talk with your doctor before you increase the amount of fluids you drink.  ?? Take an over-the-counter pain medicine if needed, such as acetaminophen (Tylenol), ibuprofen (Advil, Motrin), or naproxen (Aleve), to relieve fever, headache, and muscle aches. Read and follow all instructions on the label. No one younger than 20 should take aspirin. It has been linked to Reye syndrome, a serious illness.  ?? Do not smoke. Smoking can make the flu worse. If you need help quitting, talk to your doctor about stop-smoking programs and medicines. These can increase your chances of quitting for good.  ?? Breathe moist air from a hot shower or from a sink filled with hot water to help clear a stuffy nose.  ?? Before you use cough and cold medicines, check the label. These medicines may not be safe for young children or for people with certain health problems.  ?? If the skin around your nose and lips becomes sore, put some petroleum jelly on the area.  ?? To ease coughing:  ? Drink fluids to soothe a scratchy throat.  ? Suck on cough drops or plain hard candy.  ? Take an over-the-counter cough medicine that contains dextromethorphan to help you get some sleep. Read and follow all instructions on the label.   ? Raise your head at night with an extra pillow. This may help you rest if coughing keeps you awake.  ?? Take any prescribed medicine exactly as directed. Call your doctor if you think you are having a problem with your medicine.  To avoid spreading the  flu  ?? Wash your hands regularly, and keep your hands away from your face.  ?? Stay home from school, work, and other public places until you are feeling better and your fever has been gone for at least 24 hours. The fever needs to have gone away on its own without the help of medicine.  ?? Ask people living with you to talk to their doctors about preventing the flu. They may get antiviral medicine to keep from getting the flu from you.  ?? To prevent the flu in the future, get a flu vaccine every fall. Encourage people living with you to get the vaccine.  ?? Cover your mouth when you cough or sneeze.  When should you call for help?  Call 911 anytime you think you may need emergency care. For example, call if:  ?? ?? You have severe trouble breathing.   ??Call your doctor now or seek immediate medical care if:  ?? ?? You have new or worse trouble breathing.   ?? ?? You seem to be getting much sicker.   ?? ?? You feel very sleepy or confused.   ?? ?? You have a new or higher fever.   ?? ?? You get a new rash.   ??Watch closely for changes in your health, and be sure to contact your doctor if:  ?? ?? You begin to get better and then get worse.   ?? ?? You are not getting better after 1 week.   Where can you learn more?  Go to [email or web address].  Enter [phone] in the search box to learn more about "Influenza (Flu): Care Instructions."  Current as of: February 15, 2017  Content Version: 11.9  ?? 2006-2018 [employer]. Care instructions adapted under license by [employer] (which disclaims liability or warranty for this information). If you have questions about a medical condition or  this instruction, always ask your healthcare professional. [employer] any warranty or liability for your use of this information.        *Side effects, adverse effects, risks versus benefits associated with medications prescribed/recommended were discussed with the patient. Patient verbalized understanding. All questions answered.    *Patient was encouraged to return to the clinic and/or PCP. Or seek emergent care if worsening signs and symptoms warrant immediate evaluation including, but not limited to HA, blurred vision, speech disturbance, difficulty with ambulation/gait, numbness, tingling, weakness, syncope, chest pain, or shortness of breath.    I have reviewed the patient's medication list, past medical, family, social, and surgical history in detail and updated the patient record appropriately.    Follow-up Disposition:  Return if symptoms worsen or fail to improve.    Corrie Dandy, ANP

## 2017-07-28 ENCOUNTER — Ambulatory Visit: Admit: 2017-07-28 | Payer: PRIVATE HEALTH INSURANCE | Primary: Family Medicine

## 2017-07-28 DIAGNOSIS — J0110 Acute frontal sinusitis, unspecified: Secondary | ICD-10-CM

## 2017-07-28 MED ORDER — FLUTICASONE 50 MCG/ACTUATION NASAL SPRAY, SUSP
50 mcg/actuation | Freq: Every day | NASAL | 0 refills | Status: DC
Start: 2017-07-28 — End: 2018-05-01

## 2017-07-28 MED ORDER — AMOXICILLIN CLAVULANATE 875 MG-125 MG TAB
875-125 mg | ORAL_TABLET | Freq: Two times a day (BID) | ORAL | Status: DC
Start: 2017-07-28 — End: 2018-05-01

## 2017-07-28 NOTE — Progress Notes (Signed)
HISTORY OF PRESENT ILLNESS  Jill Kaufman is a 52 y.o. female.  Sinus Pain    The history is provided by the patient. This is a new problem. The problem has been gradually worsening. There has been no fever. The pain is moderate. The pain has been constant since onset. Associated symptoms include congestion, sinus pressure and cough. Pertinent negatives include no chills and no ear pain. Associated symptoms comments: Body aches. Treatments tried: OTC products. The treatment provided no relief.       Review of Systems   Constitutional: Positive for malaise/fatigue. Negative for chills and fever.   HENT: Positive for congestion, sinus pressure and sinus pain. Negative for ear pain.    Respiratory: Positive for cough. Negative for sputum production.        Physical Exam   Constitutional: She is oriented to person, place, and time. She appears well-developed and well-nourished.   HENT:   Head: Normocephalic.   Right Ear: Hearing, tympanic membrane and ear canal normal.   Left Ear: Hearing, tympanic membrane and ear canal normal.   Nose: Mucosal edema present. Right sinus exhibits frontal sinus tenderness. Left sinus exhibits frontal sinus tenderness.   Mouth/Throat: Uvula is midline.   PND   Cardiovascular: Normal rate, regular rhythm and normal heart sounds.   Pulmonary/Chest: Effort normal and breath sounds normal. No respiratory distress.   Neurological: She is alert and oriented to person, place, and time.   Psychiatric: She has a normal mood and affect. Her behavior is normal.   Nursing note and vitals reviewed.      ASSESSMENT and PLAN  A:     ICD-10-CM ICD-9-CM    1. Acute non-recurrent frontal sinusitis J01.10 461.1 amoxicillin-clavulanate (AUGMENTIN) 875-125 mg per tablet      fluticasone (FLONASE) 50 mcg/actuation nasal spray       P:     ? Recommendations: humidifier at HS, increase po fluid intake  ? Labs performed/ordered: POC flu test negative today     ? Follow up: Patient was encouraged to return to the clinic and/or PCP if s/s persist or do not resolve completely. Also advised to seek emergent care if worsening signs and symptoms warrant immediate evaluation including, but not limited to HA, blurred vision, speech disturbance, difficulty with ambulation/gait, numbness, tingling, weakness, syncope, abdominal pain, chest pain, or shortness of breath.    *Side effects, adverse effects, risks versus benefits associated with medications prescribed/recommended were discussed with the patient. Patient verbalized understanding. All questions answered.      * I have reviewed the patient's medication list, past medical, family, social, and surgical    history and updated the patient record appropriately      Social History     Socioeconomic History   ??? Marital status: Not on file     Spouse name: Not on file   ??? Number of children: Not on file   ??? Years of education: Not on file   ??? Highest education level: Not on file   Social Needs   ??? Financial resource strain: Not on file   ??? Food insecurity - worry: Not on file   ??? Food insecurity - inability: Not on file   ??? Transportation needs - medical: Not on file   ??? Transportation needs - non-medical: Not on file   Occupational History   ??? Not on file   Tobacco Use   ??? Smoking status: Not on file   Substance and Sexual Activity   ??? Alcohol use:  Not on file   ??? Drug use: Not on file   ??? Sexual activity: Not on file   Other Topics Concern   ??? Not on file   Social History Narrative   ??? Not on file     Past Medical History:   Diagnosis Date   ??? Thyroid disease 06/14/2011    Hypothyroidism      No past surgical history on file.  No family history on file.

## 2018-03-22 ENCOUNTER — Encounter

## 2018-03-26 ENCOUNTER — Inpatient Hospital Stay: Admit: 2018-03-26 | Payer: PRIVATE HEALTH INSURANCE | Attending: Hematology & Oncology | Primary: Family Medicine

## 2018-03-26 DIAGNOSIS — N63 Unspecified lump in unspecified breast: Secondary | ICD-10-CM

## 2018-03-26 MED ORDER — SALINE PERIPHERAL FLUSH PRN
Freq: Once | INTRAMUSCULAR | Status: AC
Start: 2018-03-26 — End: 2018-03-26
  Administered 2018-03-26: 14:00:00

## 2018-03-26 MED ORDER — GADOTERATE MEGLUMINE 0.5 MMOL/ML IV SOLUTION
0.5 mmol/mL (376.9 mg/mL) | Freq: Once | INTRAVENOUS | Status: AC
Start: 2018-03-26 — End: 2018-03-26
  Administered 2018-03-26: 14:00:00 via INTRAVENOUS

## 2018-03-26 MED ORDER — SODIUM CHLORIDE 0.9% BOLUS IV
0.9 % | Freq: Once | INTRAVENOUS | Status: AC
Start: 2018-03-26 — End: 2018-03-26
  Administered 2018-03-26: 14:00:00 via INTRAVENOUS

## 2018-04-12 ENCOUNTER — Ambulatory Visit: Attending: Surgery | Primary: Family Medicine

## 2018-04-12 ENCOUNTER — Encounter

## 2018-04-12 ENCOUNTER — Ambulatory Visit
Admit: 2018-04-12 | Discharge: 2018-04-12 | Payer: PRIVATE HEALTH INSURANCE | Attending: Surgery | Primary: Family Medicine

## 2018-04-12 DIAGNOSIS — C50111 Malignant neoplasm of central portion of right female breast: Secondary | ICD-10-CM

## 2018-04-12 DIAGNOSIS — Z170 Estrogen receptor positive status [ER+]: Secondary | ICD-10-CM

## 2018-04-12 NOTE — Progress Notes (Signed)
[place] [address]  [phone]     History and Physical   Jill Kaufman    Admit date: (Not on file)    MRN: [id number]     DOB: Nov 09, 1965     Age: 52 y.o.        Attending Physician: Lorenda Cahill, MD    HPI: Jill Kaufman is a 52 y.o. female who was recently found to have a right breast mass on mammogram.  This has been biopsied and is consistent with IDC.  ER 95%, PR 95%, Her 2 negative.  Pt has no prior history of breast disease.  She has had genetic testing that was negative for BRCA.        Documentation checklist  (yes/no/na)  1.Was patient presented at interdisciplinary breast conference pre-op?  yes    2. For mastectomy patients---Was plastic surgery consultation offered/obtained pre-op  ? N/a     3.  Is patient having sentinel node excision?  (If no, and pt had invasive carcinoma, please explain)  yes      ROS: otherwise negative     Past Medical History:   Diagnosis Date   ??? Thyroid disease 06/14/2011    Hypothyroidism      Current Outpatient Medications   Medication Sig   ??? pitavastatin calcium (LIVALO PO) Take  by mouth.   ??? levothyroxine (SYNTHROID) 75 mcg tablet Take  by mouth Daily (before breakfast).   ??? amoxicillin-clavulanate (AUGMENTIN) 875-125 mg per tablet Take 1 Tab by mouth every twelve (12) hours.   ??? fluticasone (FLONASE) 50 mcg/actuation nasal spray 2 Sprays by Both Nostrils route daily.     No current facility-administered medications for this visit.      ALERGIES:  Codeine    Social   Social History     Tobacco Use   ??? Smoking status: Never Smoker   ??? Smokeless tobacco: Never Used   Substance Use Topics   ??? Alcohol use: Not on file   ??? Drug use: Not on file     History reviewed. No pertinent family history.    ROS: The patient has no difficulty with chest pain or shortness of breath.  No fever or chills.  Comprehensive review of systems was otherwise unremarkable except as noted above.    Physical Exam:   General: Alert oriented  cooperative patient in no acute distress.   Eyes:Sclera are clear.   Resp: No JVD.  Breathing is  non-labored.    CV: RRR.   Breast: lump at 9 0'clock deep right breast is very subtle and difficult to palpate on exam.    No other findings  Axillary: negative for palpable nodes  Abd: soft non-tender and non-distended without peritoneal signs.    Extremities: unremarkable.   Neuro:  No focal signs    MRI Results (most recent):  Results from [facility] encounter on 03/26/18   MRI BREAST BI W WO CONT    Narrative MRI of the Breasts with and without contrast    CLINICAL INDICATION:  New diagnosis of right 9:00 breast cancer status post  biopsy on 10/1/2019at [facility] demonstrating invasive ductal  carcinoma grade 2. Evaluate for extent of disease, staging, therapy planning. No  reported personal malignancy or breast surgery otherwise, and no family breast  or ovarian cancer.    COMPARISON: Outside mammography and ultrasound from [facility]  03/13/2018, 03/07/2018, 02/27/2018    TECHNIQUE: Standard MRI  sequences were obtained through the breasts in multiple  planes. Images were obtained before and after intravenous infusion of 13 mL of  Dotarem contrast. Images were reviewed with PACS and with Dynacad CAD software.    FINDINGS: The breasts demonstrate moderate bilateral glandularity, and moderate  bilateral background enhancement which could limit accuracy. There are  innumerable tiny enhancing foci and a few scattered small cysts on each side.    Right lateral 9:00 biopsy site demonstrates a clip within an irregular  heterogeneously enhancing mass measuring up to 1.3 x 1.0 x 1.5 cm.    There is no evidence of suspicious enhancing mass, and no dominant or unique  nonmass enhancement, to suggest additional malignancy in either breast.  There  is no evidence of axillary or internal mammary lymphadenopathy. Chest wall  signal is normal.         Impression IMPRESSION:   1. Right 9:00  breast cancer measures up to 1.5 cm.  2. No additional discrete abnormality in either breast. Moderate background  enhancement.  3. No evidence of lymphadenopathy or chest wall abnormalities.    Recommend continued management as directed clinically. Annual bilateral  mammogram will be due in September 2020. This patient would likely benefit from  supplemental breast MRI in one year as well.    BI-RADS Assessment Category 6: Known Biopsy Proven Malignancy                     Assessment/Plan:  Jill Kaufman is a 52 y.o. female who has  T1cN0 right breast cancer.  We discussed in detail the treatment of breast cancer including surgery, oncology and radiation therapy.  We discussed surgical options including lumpectomy and mastectomy and the risks and benefits of both.  Pt would prefer breast conservation and is an excellent candidate.      Pt will have wire localized right breast lumpectomy and sentinel lymph node biopsy.          Signed:  Lorenda Cahill, MD  [id number]  3:49 PM

## 2018-04-12 NOTE — Patient Instructions (Signed)
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 About 8 of 10 invasive breast cancers  are invasive (or infiltrating) ductal carcinomas (IDC).  IDC starts in the cells that line a milk duct in the breast, breaks through the wall of the  duct, and grows into the nearby breast tissues. At this point, it may be able to spread  (metastasize) to other parts of the body through the lymph system and bloodstream.      Plan:  -discussed Lumpectomy vs mastectomy   -Follow on treatment is usually recommended with Lumpectomy:  1: Medical Oncology (hormone blocking therapy) 2. Radiation therapy    Follow Up:  -Schedule surgery through this office            _____________________________________________________________________    ?? Call office if temperature greater than 101 degrees after surgery  ?? Patient does express an interest in My Chart.  My Chart log in information explained on the after visit summary printout at the [place] desk.      Vanette Noguchi M. Raul Del, BSN, RN  Surgical Nurse Navigator  [email or web address]    [address], [address]  [place]  [address]  [phone]  Cell:  [phone]       Breast Cancer: Care Instructions  Your Care Instructions    Breast cancer occurs when abnormal cells grow out of control in the breast. These cancer cells can spread within the breast, to nearby lymph nodes and other tissues, and to other parts of the body.  Being treated for cancer can weaken your body, and you may feel very tired. Get the rest your body needs so you can feel better.  Finding out that you have cancer is scary. You may feel many emotions and may need some help coping. Seek out family, friends, and counselors for support. You also can do things at home to make yourself feel better while you go through treatment. Call the [facility]  [phone]) or visit its website at [email or web address] for more information.  Follow-up care is a key part of your treatment and safety. Be sure to make and go to all appointments, and call your doctor if you are having problems. It's also a good idea to know your test results and keep a list of the medicines you take.  How can you care for yourself at home?  ?? Take your medicines exactly as prescribed. Call your doctor if you think you are having a problem with your medicine. You may get medicine for nausea and vomiting if you have these side effects.  ?? Follow your doctor's instructions to relieve pain. Pain from cancer and surgery can almost always be controlled. Use pain medicine when you first notice pain, before it becomes severe.  ?? Eat healthy food. If you do not feel like eating, try to eat food that has protein and extra calories to keep up your strength and prevent weight loss. Drink liquid meal replacements for extra calories and protein. Try to eat your main meal early.  ?? Get some physical activity every day, but do not get too tired. Keep doing the hobbies you enjoy as your energy allows.  ?? Do not smoke. Smoking can make your cancer worse. If you need help quitting, talk to your doctor about stop-smoking programs and medicines. These can increase your chances  of quitting for good.  ?? Take steps to control your stress and workload. Learn relaxation techniques.  ? Share your feelings. Stress and tension affect our emotions. By expressing your feelings to others, you may be able to understand and cope with them.  ? Consider joining a support group. Talking about a problem with your spouse, a good friend, or other people with similar problems is a good way to reduce tension and stress.  ? Express yourself through art. Try writing, crafts, dance, or art to relieve stress. Some dance, writing, or art groups may be available just for people who have cancer.   ? Be kind to your body and mind. Getting enough sleep, eating a healthy diet, and taking time to do things you enjoy can contribute to an overall feeling of balance in your life and can help reduce stress.  ? Get help if you need it. Discuss your concerns with your doctor or counselor.  ?? If you are vomiting or have diarrhea:  ? Drink plenty of fluids (enough so that your urine is light yellow or clear like water) to prevent dehydration. Choose water and other caffeine-free clear liquids. If you have kidney, heart, or liver disease and have to limit fluids, talk with your doctor before you increase the amount of fluids you drink.  ? When you are able to eat, try clear soups, mild foods, and liquids until all symptoms are gone for 12 to 48 hours. Other good choices include dry toast, crackers, cooked cereal, and gelatin dessert, such as Jell-O.  ?? If you have not already done so, prepare a list of advance directives. Advance directives are instructions to your doctor and family members about what kind of care you want if you become unable to speak or express yourself.  When should you call for help?  Call 911 anytime you think you may need emergency care. For example, call if:  ?? ?? You passed out (lost consciousness).   ??Call your doctor now or seek immediate medical care if:  ?? ?? You have a fever.   ?? ?? You have abnormal bleeding.   ?? ?? You think you have an infection.   ?? ?? You have new or worse pain.   ?? ?? You have new symptoms, such as a cough, belly pain, vomiting, diarrhea, or a rash.   ??Watch closely for changes in your health, and be sure to contact your doctor if:  ?? ?? You are much more tired than usual.   ?? ?? You have swollen glands in your armpits, groin, or neck.   ?? ?? You do not get better as expected.   Where can you learn more?  Go to [email or web address].  Enter V321 in the search box to learn more about "Breast Cancer: Care Instructions."  Current as of: May 31, 2017   Content Version: 12.2  ?? 2006-2019 Healthwise, Incorporated. Care instructions adapted under license by [employer] (which disclaims liability or warranty for this information). If you have questions about a medical condition or this instruction, always ask your healthcare professional. [place] any warranty or liability for your use of this information.         Lumpectomy: Before Your Surgery  What is a lumpectomy?    A lumpectomy is surgery to remove cancer from the breast. Your doctor will make a small cut (incision) and take out the cancer. The whole breast will not be removed. The doctor will try to also  take a small amount of normal tissue around the cancer. This is known as "getting clear margins." Some people will need another surgery to be sure the margins are clear. The doctor may also check the nearby lymph nodes during the surgery.  After a lumpectomy, you will probably go home the same day. Most people can go back to work or their normal routine in 1 to 3 weeks. This depends on how you feel. It also depends on the type of work you do and whether you need more treatment. This may include radiation or chemotherapy. Most people who have a lumpectomy for cancer also get radiation treatment.  The surgery will leave scars. Sometimes it leaves a dent in the breast too. Most women will look normal in a bra. But your breasts may not match in size or shape after surgery. This depends on the size of your breasts. It also depends on how much tissue was removed.  When you find out that you have cancer, you may feel many emotions and may need some help coping. Seek out family, friends, and counselors for support. You also can do things at home to make yourself feel better while you go through treatment. Call the [facility] [phone]) or visit its website at [email or web address] for more information.   Follow-up care is a key part of your treatment and safety. Be sure to make and go to all appointments, and call your doctor if you are having problems. It's also a good idea to know your test results and keep a list of the medicines you take.  What happens before surgery?  ??Surgery can be stressful. This information will help you understand what you can expect. And it will help you safely prepare for surgery.  ??Preparing for surgery  ?? ?? Understand exactly what surgery is planned, along with the risks, benefits, and other options.    ?? Tell your doctors ALL the medicines, vitamins, supplements, and herbal remedies you take. Some of these can increase the risk of bleeding or interact with anesthesia.   ?? ?? If you take blood thinners, such as warfarin (Coumadin), clopidogrel (Plavix), or aspirin, be sure to talk to your doctor. He or she will tell you if you should stop taking these medicines before your surgery. Make sure that you understand exactly what your doctor wants you to do.   ?? ?? Your doctor will tell you which medicines to take or stop before your surgery. You may need to stop taking certain medicines a week or more before surgery. So talk to your doctor as soon as you can.   ?? ?? If you have an advance directive, let your doctor know. It may include a living will and a durable power of attorney for health care. Bring a copy to the hospital. If you don't have one, you may want to prepare one. It lets your doctor and loved ones know your health care wishes. Doctors advise that everyone prepare these papers before any type of surgery or procedure.   What happens on the day of surgery?   ?? Follow the instructions exactly about when to stop eating and drinking. If you don't, your surgery may be canceled. If your doctor told you to take your medicines on the day of surgery, take them with only a sip of water.   ?? ?? Take a bath or shower before you come in for your surgery. Do not  apply lotions, perfumes, deodorants, or nail polish.   ?? ??  Do not shave the surgical site yourself.   ?? ?? Take off all jewelry and piercings. And take out contact lenses, if you wear them.   ?? ?? Bring a comfortable, supportive bra with you. You will need to wear this all the time, even during the night, for the first week after surgery.   ??At the hospital or surgery center   ?? Bring a picture ID.   ?? ?? The area for surgery is often marked to make sure there are no errors. If your doctor can't feel the lump, a needle can be put in the suspicious area. This may be done during a mammogram just before surgery. The needle will guide your doctor.   ?? ?? You will be kept comfortable and safe by your anesthesia provider. The anesthesia may make you sleep. Or it may just numb the area being worked on.   ?? ?? The surgery will take about 1 hour or longer, depending on the size of the lump.   Going home   ?? Be sure you have someone to drive you home. Anesthesia and pain medicine make it unsafe for you to drive.   ?? ?? You will be given more specific instructions about recovering from your surgery. They will cover things like diet, wound care, follow-up care, driving, and getting back to your normal routine.   When should you call your doctor?   ?? You have questions or concerns.   ?? ?? You don't understand how to prepare for your surgery.   ?? ?? You become ill before the surgery (such as fever, flu, or a cold).   ?? ?? You need to reschedule or have changed your mind about having the surgery.   Where can you learn more?  Go to [email or web address].  Enter Z252 in the search box to learn more about "Lumpectomy: Before Your Surgery."  Current as of: May 31, 2017  Content Version: 12.2  ?? 2006-2019 Healthwise, Inc[employer]. Care instructions adapted under license by [employer] (which disclaims liability or warranty for this information). If you have questions about a medical condition or  this instruction, always ask your healthcare professional. [place] any warranty or liability for your use of this information.         Lumpectomy: What to Expect at [facility]    For 1 or 2 days after the surgery you will probably feel tired and have some pain. The skin around the cut (incision) may feel firm, swollen, and tender, and be bruised. Tenderness should go away in about 2 or 3 days, and the bruising within 2 weeks. Firmness and swelling may last for 3 to 6 months.  You may feel a soft lump in your breast that gradually turns hard. This is the incision healing. It is not cancer.  Women should wear a well-fitted and supportive bra, even during the night, for 1 week. You will probably be able to go back to work or your normal routine in 1 to 3 weeks after the surgery. This may depend on whether you have more treatment.  Your doctor may have removed some lymph nodes in your armpit to see if the cancer has spread. If so, you may feel either numbness or tingling ("pins and needles") in your armpit or on the inside of your upper arm. This should improve over the next several weeks. Some people have numbness for a longer time.  When you find out that you have cancer,  you may feel many emotions and may need some help coping. Seek out family, friends, and counselors for support. You also can do things at home to make yourself feel better while you go through treatment. Call the [facility] [phone]) or visit its website at [email or web address] for more information.  This care sheet gives you a general idea about how long it will take for you to recover. But each person recovers at a different pace. Follow the steps below to get better as quickly as possible.  How can you care for yourself at home?  Activity  ?? ?? Rest when you feel tired. Getting enough sleep will help you recover. You may want to sleep on the side that has not been operated on. A woman  may want to use a pillow to support the affected breast while lying on her side.   ?? ?? Avoid strenuous activities, such as biking, jogging, weightlifting, or aerobic exercise, for 1 month or until your doctor says it is okay. This may include housework, such as washing windows, especially if you have to use the arm next to the affected breast.   ?? ?? Most people can return to their normal activities within 2 weeks.   ?? ?? Try to walk each day. Start out by walking a little more than you did the day before. Bit by bit, increase the amount you walk. Walking boosts blood flow and helps prevent pneumonia and constipation.   ?? ?? For 1 to 2 weeks, avoid lifting anything over 10 to 15 pounds or that would make you strain. This may include heavy grocery bags and milk containers, a heavy briefcase or backpack, cat litter or dog food bags, a vacuum cleaner, or a child.   ?? ?? You may drive when you are no longer taking pain medicine and can use your arm without pain. Talk to your doctor about when to start driving, especially if you are having radiation treatments.   ?? ?? You will probably be able to go back to work or your normal routine in 1 to 3 weeks. It may be longer, depending on the type of work you do and whether you are having radiation or chemotherapy.   ?? ?? You may shower 24 to 48 hours after surgery, if your doctor okays it. Pat the incision dry. Do not take a bath for the first 2 weeks, or until your doctor tells you it is okay.   Diet  ?? ?? You can eat your normal diet. If your stomach is upset, try bland, low-fat foods like plain rice, broiled chicken, toast, and yogurt.   ?? ?? You may notice that your bowel movements are not regular right after your surgery. This is common. Try to avoid constipation and straining with bowel movements. You may want to take a fiber supplement every day. If you have not had a bowel movement after a couple of days, ask your doctor about taking a mild laxative.   Medicines   ?? ?? Your doctor will tell you if and when you can restart your medicines. He or she will also give you instructions about taking any new medicines.   ?? ?? If you take blood thinners, such as warfarin (Coumadin), clopidogrel (Plavix), or aspirin, be sure to talk to your doctor. He or she will tell you if and when to start taking those medicines again. Make sure that you understand exactly what your doctor wants you to do.   ?? ??  Take pain medicines exactly as directed.  ? Your doctor may have given you a medicine to numb the area inside and around your cut (incision). The numbness will last from 6 to 12 hours. If you went home right after the surgery, you may want to take pain medicine before this wears off.  ? If the doctor gave you a prescription medicine for pain, take it as prescribed.  ? If you are not taking a prescription pain medicine, ask your doctor if you can take an over-the-counter medicine.   ?? ?? If your doctor prescribed antibiotics, take them as directed. Do not stop taking them just because you feel better. You need to take the full course of antibiotics.   ?? ?? If you think your pain medicine is making you sick to your stomach:  ? Take your medicine after meals (unless your doctor has told you not to).  ? Ask your doctor for a different pain medicine.   Incision care  ?? ?? If you have strips of tape on the cut the doctor made (incision), leave the tape on for a week or until it falls off.   ?? ?? When you can shower, wash the area daily with warm, soapy water and pat it dry.   Follow-up care is a key part of your treatment and safety. Be sure to make and go to all appointments, and call your doctor if you are having problems. It's also a good idea to know your test results and keep a list of the medicines you take.  When should you call for help?  Call 911 anytime you think you may need emergency care. For example, call if:  ?? ?? You passed out (lost consciousness).    ?? ?? You have chest pain, are short of breath, or cough up blood.   ??Call your doctor now or seek immediate medical care if:  ?? ?? You are sick to your stomach or cannot drink fluids.   ?? ?? You cannot pass stools or gas.   ?? ?? You have pain that does not get better after you take your pain medicine.   ?? ?? You have loose stitches, or your incision comes open.   ?? ?? Bright red blood has soaked through the bandage over your incision.   ?? ?? You have signs of a blood clot in your leg (called a deep vein thrombosis), such as:  ? Pain in your calf, back of the knee, thigh, or groin.  ? Redness or swelling in your leg.   ?? ?? You have signs of infection, such as:  ? Increased pain, swelling, warmth, or redness.  ? Red streaks leading from the incision.  ? Pus draining from the incision.  ? A fever.   ??Watch closely for changes in your health, and be sure to contact your doctor if:  ?? ?? You have any problems.   ?? ?? You have new or worse swelling or pain in your arm.   Where can you learn more?  Go to [email or web address].  Enter D222 in the search box to learn more about "Lumpectomy: What to Expect at Home."  Current as of: May 31, 2017  Content Version: 12.2  ?? 2006-2019 Healthwise, Inc[employer]. Care instructions adapted under license by [employer] (which disclaims liability or warranty for this information). If you have questions about a medical condition or this instruction, always ask your healthcare professional. [place] any warranty or liability for your  use of this information.         Sentinel Lymph Node Biopsy: About This Test  What is it?  A sentinel lymph node biopsy is a surgery to take out lymph node tissue to look for cancer that has spread into the lymph system.  The lymph system is a network of vessels that carries material between the body tissues and the bloodstream. The sentinel lymph node is the first  node in the body where cancer cells may be found if the cancer has spread from the original site.  Why is this test done?  This test is done to see if a cancer has spread from its original site. This information helps stage a cancer. The stage is a way for doctors to describe how far the cancer has spread. Your treatment choices will be based partly on the type and stage of cancer.  How can you prepare for the test?  ?? ?? Talk to your doctor about concerns you have regarding the need for the test, its risks, how it will be done, and what the results may mean.   ?? ?? Tell your doctor ALL the medicines, vitamins, supplements, and herbal remedies you take.   ?? ?? If you take aspirin or some other blood thinner, be sure to talk to your doctor. He or she will tell you if you should stop taking it before your test. Make sure that you understand exactly what your doctor wants you to do.   ?? ?? Tell your doctor if you are allergic to any medicines, including any anesthetics or dyes.   ?? ?? Tell your doctor if you are or might be pregnant.    ?? Follow the instructions exactly about when to stop eating and drinking. If you don't, your test may be canceled. If your doctor told you to take your medicines on the day of the test, take them with only a sip of water.   What happens before the test?  ?? You will take off your clothing near the biopsy site. A paper or cloth gown will cover you during the test.  ?? You will be kept comfortable and safe by your anesthesia provider. You will be asleep during the biopsy.  What happens during the test?  ?? The doctor injects a dye or tracer into your body near your cancer site. A special camera takes pictures of the lymph nodes. The pictures help the doctor identify the sentinel nodes.  ?? The doctor gives you a numbing medicine, then removes the sentinel node and a small amount of tissue around it. The node is tested for cancer  cells during the biopsy. The results help the doctor decide whether to remove any more nodes.  ?? You will have some stitches and a bandage over the biopsy site.  What else should you know about the test?  ?? If the sentinel lymph nodes do not have cancer, no other nodes will need to be taken out.  ?? If the nodes contain cancer cells, more nodes may need to be removed.  ?? If you are having surgery to remove the cancer, the sentinel node biopsy may be done during the surgery.  How long does the test take?  ?? The biopsy usually takes 30 to 60 minutes. It may take longer if you have surgery to remove the cancer at the same time.  What happens after the test?  ?? The doctor will tell you what to do if you have  any bleeding, numbness, or swelling at the biopsy site.  ?? You might be able to go home the same day. Be sure to have someone drive you home.  ?? Your skin may be blue from the dye for several days after the test. The dye may also turn your urine green for 1 to 2 days.  ?? Allow the area to heal. Don't move quickly or lift anything heavy until you are feeling better.  ?? During your follow-up visit, your doctor will discuss the results of your biopsy with you and take out any stitches.  Follow-up care is a key part of your treatment and safety. Be sure to make and go to all appointments, and call your doctor if you are having problems. It's also a good idea to keep a list of the medicines you take. Ask your doctor when you can expect to have your test results.  Where can you learn more?  Go to [email or web address].  Enter V333 in the search box to learn more about "Sentinel Lymph Node Biopsy: About This Test."  Current as of: May 31, 2017  Content Version: 12.2  ?? 2006-2019 Healthwise, Incorporated. Care instructions adapted under license by [employer] (which disclaims liability or warranty for this information). If you have questions about a medical condition or  this instruction, always ask your healthcare professional. [place] any warranty or liability for your use of this information.        CARE INSTRUCTIONS:  Your surgeon has given you verbal instructions regarding your plan of care today.    FOLLOW -UP AFTER A TEST OR STUDY:  If you are having labs drawn or a radiologic study, there will be a follow-up appointment needed after that, which either has already been made for you today or will need to be made by you to review the results of your study or test (unless special arrangements were made today for you by your surgeon).    We are unable to review results over the phone because those results invariably lead to questions that you or your surgeon need to make together during an appointment.  Therefore, please do not call the office for test results as those should be discussed at your next appointment, and if you don't have a next appointment, please call and get one after your test is completed, or as you were instructed.    AFTER - HOURS CALLS:  We have a surgeon-call for emergencies 24 hours a day.  After the office is closed, which is usually at [place] and on weekends and holidays, the on-call surgeon can be reached by dialing our office number [phone]).    Please do not call unless you are having an urgent or emergent issue.  The on-call surgeon cannot make appointment changes or call in refills for pain medications after the office is closed.

## 2018-04-12 NOTE — Progress Notes (Signed)
[place] [address]  [phone]     History and Physical   Jill Kaufman    Admit date: (Not on file)    MRN: [id number]     DOB: [birth year]     Age: 52 y.o.        Attending Physician: Lorenda Cahill, MD    HPI: Jill Kaufman is a 52 y.o. female who was recently found to have a right breast mass on mammogram.  This has been biopsied and is consistent with IDC.  ER 95%, PR 95%, Her 2 negative.  Pt has no prior history of breast disease.  She has had genetic testing that was negative for BRCA.        Documentation checklist  (yes/no/na)  1.Was patient presented at interdisciplinary breast conference pre-op?  yes    2. For mastectomy patients---Was plastic surgery consultation offered/obtained pre-op  ? N/a     3.  Is patient having sentinel node excision?  (If no, and pt had invasive carcinoma, please explain)  yes      ROS: otherwise negative     Past Medical History:   Diagnosis Date   ??? Thyroid disease 06/14/2011    Hypothyroidism      Current Outpatient Medications   Medication Sig   ??? pitavastatin calcium (LIVALO PO) Take  by mouth.   ??? levothyroxine (SYNTHROID) 75 mcg tablet Take  by mouth Daily (before breakfast).   ??? amoxicillin-clavulanate (AUGMENTIN) 875-125 mg per tablet Take 1 Tab by mouth every twelve (12) hours.   ??? fluticasone (FLONASE) 50 mcg/actuation nasal spray 2 Sprays by Both Nostrils route daily.     No current facility-administered medications for this visit.      ALERGIES:  Codeine    Social   Social History     Tobacco Use   ??? Smoking status: Never Smoker   ??? Smokeless tobacco: Never Used   Substance Use Topics   ??? Alcohol use: Not on file   ??? Drug use: Not on file     History reviewed. No pertinent family history.    ROS: The patient has no difficulty with chest pain or shortness of breath.  No fever or chills.  Comprehensive review of systems was otherwise unremarkable except as noted above.    Physical Exam:    General: Alert oriented cooperative patient in no acute distress.   Eyes:Sclera are clear.   Resp: No JVD.  Breathing is  non-labored.    CV: RRR.   Breast: lump at 9 0'clock deep right breast is very subtle and difficult to palpate on exam.    No other findings  Axillary: negative for palpable nodes  Abd: soft non-tender and non-distended without peritoneal signs.    Extremities: unremarkable.   Neuro:  No focal signs    MRI Results (most recent):  Results from [facility] encounter on 03/26/18   MRI BREAST BI W WO CONT    Narrative MRI of the Breasts with and without contrast    CLINICAL INDICATION:  New diagnosis of right 9:00 breast cancer status post  biopsy on 10/1/2019at [facility] demonstrating invasive ductal  carcinoma grade 2. Evaluate for extent of disease, staging, therapy planning. No  reported personal malignancy or breast surgery otherwise, and no family breast  or ovarian cancer.    COMPARISON: Outside mammography and ultrasound from [facility]  03/13/2018, 03/07/2018, 02/27/2018    TECHNIQUE: Standard MRI  sequences were obtained through the breasts in multiple  planes. Images were obtained before and after intravenous infusion of 13 mL of  Dotarem contrast. Images were reviewed with PACS and with Dynacad CAD software.    FINDINGS: The breasts demonstrate moderate bilateral glandularity, and moderate  bilateral background enhancement which could limit accuracy. There are  innumerable tiny enhancing foci and a few scattered small cysts on each side.    Right lateral 9:00 biopsy site demonstrates a clip within an irregular  heterogeneously enhancing mass measuring up to 1.3 x 1.0 x 1.5 cm.    There is no evidence of suspicious enhancing mass, and no dominant or unique  nonmass enhancement, to suggest additional malignancy in either breast.  There  is no evidence of axillary or internal mammary lymphadenopathy. Chest wall  signal is normal.          Impression IMPRESSION:   1. Right 9:00 breast cancer measures up to 1.5 cm.  2. No additional discrete abnormality in either breast. Moderate background  enhancement.  3. No evidence of lymphadenopathy or chest wall abnormalities.    Recommend continued management as directed clinically. Annual bilateral  mammogram will be due in September 2020. This patient would likely benefit from  supplemental breast MRI in one year as well.    BI-RADS Assessment Category 6: Known Biopsy Proven Malignancy                     Assessment/Plan:  Jill Kaufman is a 52 y.o. female who has  T1cN0 right breast cancer.  We discussed in detail the treatment of breast cancer including surgery, oncology and radiation therapy.  We discussed surgical options including lumpectomy and mastectomy and the risks and benefits of both.  Pt would prefer breast conservation and is an excellent candidate.      Pt will have wire localized right breast lumpectomy and sentinel lymph node biopsy.          Signed:  Lorenda Cahill, MD  42/59/5638  3:49 PM

## 2018-04-27 NOTE — Interval H&P Note (Signed)
Patient verified name and DOB.    Order for consent found in EHR and matches case posting; patient verifies procedure. RIGHT BREAST LUMPECTOMY WITH NEEDLE LOCALIZATION AND SENTINEL NODE BIOPSY    Type 1B surgery, PAT phone assessment complete.  Orders received.    Labs per surgeon: none  Labs per anesthesia protocol: none    Patient answered medical/surgical history questions at their best of ability. All prior to admission medications documented in [facility].    Patient instructed to take the following medications the day of surgery according to anesthesia guidelines with a small sip of water: synthroid.  Hold all vitamins 7 days prior to surgery and NSAIDS 5 days prior to surgery.    Patient instructed on the following:  Arrive at A Entrance, time of arrival to be called the day before by 1700  NPO after midnight including gum, mints, and ice chips  Responsible adult must drive patient to the hospital, stay during surgery, and patient will need supervision 24 hours after anesthesia  Use antibacterial soap in shower the night before surgery and on the morning of surgery  All piercings must be removed prior to arrival.    Leave all valuables (money and jewelry) at home but bring insurance card and ID on  DOS.   Do not wear make-up, nail polish, lotions, cologne, perfumes, powders, or oil on skin.    Patient teach back successful and patient demonstrates knowledge of instruction.

## 2018-04-28 ENCOUNTER — Inpatient Hospital Stay: Primary: Family Medicine

## 2018-05-01 NOTE — Other (Signed)
Patient verified name and DOB.    Order for consent found in EHR and matches case posting; patient verifies procedure. RIGHT BREAST LUMPECTOMY WITH NEEDLE LOCALIZATION AND SENTINEL NODE BIOPSY    Type 1B surgery, PAT phone assessment complete.  Orders received.    Labs per surgeon: none  Labs per anesthesia protocol: none    Patient answered medical/surgical history questions at their best of ability. All prior to admission medications documented in [facility].    Patient instructed to take the following medications the day of surgery according to anesthesia guidelines with a small sip of water: synthroid.  Hold all vitamins 7 days prior to surgery and NSAIDS 5 days prior to surgery.    Patient instructed on the following:  Arrive at A Entrance, time of arrival to be called the day before by 1700  NPO after midnight including gum, mints, and ice chips  Responsible adult must drive patient to the hospital, stay during surgery, and patient will need supervision 24 hours after anesthesia  Use antibacterial soap in shower the night before surgery and on the morning of surgery  All piercings must be removed prior to arrival.    Leave all valuables (money and jewelry) at home but bring insurance card and ID on  DOS.   Do not wear make-up, nail polish, lotions, cologne, perfumes, powders, or oil on skin.    Patient teach back successful and patient demonstrates knowledge of instruction.

## 2018-05-04 ENCOUNTER — Ambulatory Visit: Admit: 2018-05-04 | Payer: PRIVATE HEALTH INSURANCE | Primary: Family Medicine

## 2018-05-04 ENCOUNTER — Encounter: Primary: Family Medicine

## 2018-05-04 MED ORDER — SODIUM CHLORIDE 0.9 % IJ SYRG
Freq: Three times a day (TID) | INTRAMUSCULAR | Status: DC
Start: 2018-05-04 — End: 2018-05-04

## 2018-05-04 MED ORDER — ONDANSETRON (PF) 4 MG/2 ML INJECTION
4 mg/2 mL | INTRAMUSCULAR | Status: AC
Start: 2018-05-04 — End: ?

## 2018-05-04 MED ORDER — LIDOCAINE HCL 1 % (10 MG/ML) IJ SOLN
10 mg/mL (1 %) | INTRAMUSCULAR | Status: AC
Start: 2018-05-04 — End: ?

## 2018-05-04 MED ORDER — ONDANSETRON (PF) 4 MG/2 ML INJECTION
4 mg/2 mL | Freq: Once | INTRAMUSCULAR | Status: DC
Start: 2018-05-04 — End: 2018-05-04

## 2018-05-04 MED ORDER — SODIUM CHLORIDE 0.9 % IJ SYRG
INTRAMUSCULAR | Status: DC | PRN
Start: 2018-05-04 — End: 2018-05-04

## 2018-05-04 MED ORDER — ONDANSETRON (PF) 4 MG/2 ML INJECTION
4 mg/2 mL | INTRAMUSCULAR | Status: DC | PRN
Start: 2018-05-04 — End: 2018-05-04
  Administered 2018-05-04: 18:00:00 via INTRAVENOUS

## 2018-05-04 MED ORDER — HYDROCODONE-ACETAMINOPHEN 5 MG-325 MG TAB
5-325 mg | ORAL | Status: DC | PRN
Start: 2018-05-04 — End: 2018-05-04

## 2018-05-04 MED ORDER — LIDOCAINE HCL 1 % (10 MG/ML) IJ SOLN
10 mg/mL (1 %) | INTRAMUSCULAR | Status: DC | PRN
Start: 2018-05-04 — End: 2018-05-04

## 2018-05-04 MED ORDER — LIDOCAINE HCL 1 % (10 MG/ML) IJ SOLN
10 mg/mL (1 %) | Freq: Once | INTRAMUSCULAR | Status: AC
Start: 2018-05-04 — End: 2018-05-04
  Administered 2018-05-04: 15:00:00 via SUBCUTANEOUS

## 2018-05-04 MED ORDER — FENTANYL CITRATE (PF) 50 MCG/ML IJ SOLN
50 mcg/mL | Freq: Once | INTRAMUSCULAR | Status: DC
Start: 2018-05-04 — End: 2018-05-04

## 2018-05-04 MED ORDER — LACTATED RINGERS IV
INTRAVENOUS | Status: DC
Start: 2018-05-04 — End: 2018-05-04
  Administered 2018-05-04 (×2): via INTRAVENOUS

## 2018-05-04 MED ORDER — LIDOCAINE HCL 1 % (10 MG/ML) IJ SOLN
10 mg/mL (1 %) | INTRAMUSCULAR | Status: DC | PRN
Start: 2018-05-04 — End: 2018-05-04
  Administered 2018-05-04: 18:00:00 via SUBCUTANEOUS

## 2018-05-04 MED ORDER — LIDOCAINE (PF) 20 MG/ML (2 %) IJ SOLN
20 mg/mL (2 %) | INTRAMUSCULAR | Status: DC | PRN
Start: 2018-05-04 — End: 2018-05-04
  Administered 2018-05-04: 17:00:00 via INTRAVENOUS

## 2018-05-04 MED ORDER — APREPITANT 40 MG CAP
40 mg | Freq: Once | ORAL | Status: AC
Start: 2018-05-04 — End: 2018-05-04
  Administered 2018-05-04: 17:00:00 via ORAL

## 2018-05-04 MED ORDER — EPHEDRINE 50 MG/5 ML (10 MG/ML) IN NS IV SYRINGE
50 mg/5 mL (10 mg/mL) | INTRAVENOUS | Status: DC | PRN
Start: 2018-05-04 — End: 2018-05-04
  Administered 2018-05-04 (×2): via INTRAVENOUS

## 2018-05-04 MED ORDER — PROPOFOL 10 MG/ML IV EMUL
10 mg/mL | INTRAVENOUS | Status: DC | PRN
Start: 2018-05-04 — End: 2018-05-04
  Administered 2018-05-04: 17:00:00 via INTRAVENOUS

## 2018-05-04 MED ORDER — DEXAMETHASONE SODIUM PHOSPHATE 10 MG/ML IJ SOLN
10 mg/mL | INTRAMUSCULAR | Status: AC
Start: 2018-05-04 — End: ?

## 2018-05-04 MED ORDER — LACTATED RINGERS IV
INTRAVENOUS | Status: DC
Start: 2018-05-04 — End: 2018-05-04

## 2018-05-04 MED ORDER — PROPOFOL 10 MG/ML IV EMUL
10 mg/mL | INTRAVENOUS | Status: AC
Start: 2018-05-04 — End: ?

## 2018-05-04 MED ORDER — FENTANYL CITRATE (PF) 50 MCG/ML IJ SOLN
50 mcg/mL | INTRAMUSCULAR | Status: AC
Start: 2018-05-04 — End: ?

## 2018-05-04 MED ORDER — CEFAZOLIN 2 GRAM/20 ML IN STERILE WATER INTRAVENOUS SYRINGE
2 gram/0 mL | Freq: Once | INTRAVENOUS | Status: AC
Start: 2018-05-04 — End: 2018-05-04
  Administered 2018-05-04: 17:00:00 via INTRAVENOUS

## 2018-05-04 MED ORDER — LIDOCAINE (PF) 20 MG/ML (2 %) IJ SOLN
20 mg/mL (2 %) | INTRAMUSCULAR | Status: AC
Start: 2018-05-04 — End: ?

## 2018-05-04 MED ORDER — OXYCODONE 5 MG TAB
5 mg | Freq: Once | ORAL | Status: DC | PRN
Start: 2018-05-04 — End: 2018-05-04

## 2018-05-04 MED ORDER — FENTANYL CITRATE (PF) 50 MCG/ML IJ SOLN
50 mcg/mL | INTRAMUSCULAR | Status: DC | PRN
Start: 2018-05-04 — End: 2018-05-04
  Administered 2018-05-04 (×4): via INTRAVENOUS

## 2018-05-04 MED ORDER — DEXAMETHASONE SODIUM PHOSPHATE 4 MG/ML IJ SOLN
4 mg/mL | INTRAMUSCULAR | Status: DC | PRN
Start: 2018-05-04 — End: 2018-05-04
  Administered 2018-05-04: 18:00:00 via INTRAVENOUS

## 2018-05-04 MED ORDER — MIDAZOLAM 1 MG/ML IJ SOLN
1 mg/mL | Freq: Once | INTRAMUSCULAR | Status: AC | PRN
Start: 2018-05-04 — End: 2018-05-04
  Administered 2018-05-04: 17:00:00 via INTRAVENOUS

## 2018-05-04 MED ORDER — HYDROMORPHONE (PF) 2 MG/ML IJ SOLN
2 mg/mL | INTRAMUSCULAR | Status: DC | PRN
Start: 2018-05-04 — End: 2018-05-04

## 2018-05-04 MED ORDER — EPHEDRINE 50 MG/5 ML (10 MG/ML) IN NS IV SYRINGE
50 mg/5 mL (10 mg/mL) | INTRAVENOUS | Status: AC
Start: 2018-05-04 — End: ?

## 2018-05-04 MED ORDER — OXYCODONE-ACETAMINOPHEN 5 MG-325 MG TAB
5-325 mg | ORAL_TABLET | ORAL | 0 refills | Status: AC | PRN
Start: 2018-05-04 — End: 2018-05-09

## 2018-05-04 MED ORDER — MIDAZOLAM 1 MG/ML IJ SOLN
1 mg/mL | INTRAMUSCULAR | Status: AC
Start: 2018-05-04 — End: ?

## 2018-05-04 MED ORDER — TECHNETIUM TC 99M SULFUR COLLOID FILTERED
Freq: Once | Status: AC
Start: 2018-05-04 — End: 2018-05-04
  Administered 2018-05-04: 13:00:00 via SUBCUTANEOUS

## 2018-05-04 MED FILL — LIDOCAINE HCL 1 % (10 MG/ML) IJ SOLN: 10 mg/mL (1 %) | INTRAMUSCULAR | Qty: 20

## 2018-05-04 MED FILL — XYLOCAINE 10 MG/ML (1 %) INJECTION SOLUTION: 10 mg/mL (1 %) | INTRAMUSCULAR | Qty: 20

## 2018-05-04 MED FILL — ONDANSETRON (PF) 4 MG/2 ML INJECTION: 4 mg/2 mL | INTRAMUSCULAR | Qty: 2

## 2018-05-04 MED FILL — MIDAZOLAM 1 MG/ML IJ SOLN: 1 mg/mL | INTRAMUSCULAR | Qty: 2

## 2018-05-04 MED FILL — XYLOCAINE-MPF 20 MG/ML (2 %) INJECTION SOLUTION: 20 mg/mL (2 %) | INTRAMUSCULAR | Qty: 5

## 2018-05-04 MED FILL — APREPITANT 40 MG CAP: 40 mg | ORAL | Qty: 1

## 2018-05-04 MED FILL — PROPOFOL 10 MG/ML IV EMUL: 10 mg/mL | INTRAVENOUS | Qty: 20

## 2018-05-04 MED FILL — DEXAMETHASONE SODIUM PHOSPHATE 10 MG/ML IJ SOLN: 10 mg/mL | INTRAMUSCULAR | Qty: 1

## 2018-05-04 MED FILL — EPHEDRINE 50 MG/5 ML (10 MG/ML) IN NS IV SYRINGE: 50 mg/5 mL (10 mg/mL) | INTRAVENOUS | Qty: 5

## 2018-05-04 MED FILL — CEFAZOLIN 2 GRAM/20 ML IN STERILE WATER INTRAVENOUS SYRINGE: 2 gram/0 mL | INTRAVENOUS | Qty: 20

## 2018-05-04 MED FILL — FENTANYL CITRATE (PF) 50 MCG/ML IJ SOLN: 50 mcg/mL | INTRAMUSCULAR | Qty: 2

## 2018-05-04 NOTE — Op Note (Signed)
BRIEF OPERATIVE NOTE    Date of Procedure: 05/04/2018   Preoperative Diagnosis: Malignant neoplasm of right female breast, unspecified estrogen receptor status, unspecified site of breast ([place]) [C50.911]  Postoperative Diagnosis: Malignant neoplasm of right female breast, unspecified estrogen receptor status, unspecified site of breast ([place]) [C50.911]    Procedure(s):  RIGHT BREAST LUMPECTOMY  RIGHT BREAST NEEDLE LOCALIZED BIOPSY  RIGHT BREAST SENTINEL NODE BIOPSY      Surgeon(s) and Role:     * Ednah Hammock, Patsy [place], MD - Primary         Surgical Assistant: none    Surgical Staff:  Circ-1: Dyane Dustman, RN  Scrub Tech-1: Lavonia Drafts  Scrub RN-1: Melissa Noon M  Event Time In Time Out   Incision Start 1235    Incision Close 1316      Anesthesia: General   Estimated Blood Loss: 55ml  Specimens:   ID Type Source Tests Collected by Time Destination   1 : right breast Fresh   Lorenda Cahill, MD 79/07/4095 1247 Pathology   2 : right breast deep medial margin Preservative   Lorenda Cahill, MD 35/32/9924 1255 Pathology   3 : right breast superior margin Preservative   Lorenda Cahill, MD 26/83/4196 1255 Pathology   4 : right breast inferior margin Preservative   Lorenda Cahill, MD 22/29/7989 1259 Pathology   5 : right sentinel node biopsy Preservative   Lorenda Cahill, MD 21/19/4174 1306 Pathology      Findings: as above    Complications: none  Implants: * No implants in log *

## 2018-05-04 NOTE — Anesthesia Pre-Procedure Evaluation (Signed)
Relevant Problems   No relevant active problems       Anesthetic History     PONV          Review of Systems / Medical History  Patient summary reviewed and pertinent labs reviewed    Pulmonary  Within defined limits                 Neuro/Psych   Within defined limits           Cardiovascular  Within defined limits                Exercise tolerance: >4 METS     GI/Hepatic/Renal     GERD: well controlled           Endo/Other      Hypothyroidism: well controlled  Cancer (breast)     Other Findings              Physical Exam    Airway  Mallampati: II  TM Distance: 4 - 6 cm  Neck ROM: normal range of motion   Mouth opening: Normal     Cardiovascular  Regular rate and rhythm,  S1 and S2 normal,  no murmur, click, rub, or gallop             Dental         Pulmonary  Breath sounds clear to auscultation               Abdominal         Other Findings            Anesthetic Plan    ASA: 2  Anesthesia type: general          Induction: Intravenous  Anesthetic plan and risks discussed with: Patient and Spouse

## 2018-05-04 NOTE — Anesthesia Post-Procedure Evaluation (Signed)
Procedure(s):  RIGHT BREAST LUMPECTOMY  RIGHT BREAST NEEDLE LOCALIZED BIOPSY  RIGHT BREAST SENTINEL NODE BIOPSY    PREOP 0600 LYMPHO 0730 LOC 1030 SURGERY 1230.    general    Anesthesia Post Evaluation      Multimodal analgesia: multimodal analgesia used between 6 hours prior to anesthesia start to PACU discharge  Patient location during evaluation: bedside  Patient participation: complete - patient participated  Level of consciousness: awake and alert  Pain score: 3  Pain management: adequate  Airway patency: patent  Anesthetic complications: no  Cardiovascular status: acceptable and hemodynamically stable  Respiratory status: acceptable  Hydration status: acceptable  Post anesthesia nausea and vomiting:  none      Vitals Value Taken Time   BP 142/80 05/04/2018  1:45 PM   Temp 37.2 ??C (99 ??F) 05/04/2018  1:26 PM   Pulse 92 05/04/2018  1:45 PM   Resp 16 05/04/2018  1:45 PM   SpO2 96 % 05/04/2018  1:45 PM

## 2018-05-04 NOTE — Progress Notes (Signed)
Spiritual care and pre-op prayer given to pt.    Cindy Bishop, M.Div  Chaplain

## 2018-05-04 NOTE — Op Note (Signed)
Breast Lumpectomy, needle localization with Sentinel Node    PRE-PROCEDURE DIAGNOSIS: right breast cancer     POSTOPERATIVE DIAGNOSIS: same    NAME OF PROCEDURE: right breast lumpectomy, needle localized, sentinel lymph node biopsy with lymphatic mapping    SURGEON: Marjory Lies, MD.     ASSISTANT: None.     ANESTHESIA: General.     ESTIMATED BLOOD LOSS: 49ml    SPECIMENS: lumpectomy, sentinel node, deep medial margin, superior margin, inferior margin    Drains: none     INDICATIONS: The patient is a 52yo. Risks, benefits, and alternatives were discussed in detail with the patient preoperatively. She understood the risks and wished to   proceed and consent was obtained.     PROCEDURE: She was taken to the operating room, placed on table in supine   position. Needle localization of the right breast mass was done in   radiology prior and the lymph node was mapped using radioactive tracer.  The right breast and axilla were prepped and draped in   routine sterile fashion. The sentinel lymph node portion was done first.   A  Small incision was made at the inferior border of the hairline in the   axilla and dissection was carefully performed through the subcutaneous   tissue to the axillary lymph nodes identified by neoprobe. The sentinel node was    identified.It was dissected out. All lymph nodes with sufficient increase in uptake were removed and sent to pathology. Irrigation was performed and   the wound closed in 2 layers with 3-0 Vicryl and 4-0 subcu Monocryl.   Sterile dressing was applied. Attention was then turned to the   lumpectomy. Incision was made along skin lines following the localization wire to the appropriate position..Dissection   around the mass was performed with electrocautery to include a generous margin.   Specimen radiograph showed removal of the proper tissue and the previously   placed biopsy clip.   Irrigation was performed of the cavity. Hemostasis was assured. The   tissues were  closed with 3-0 Vicryl and 4-0 subcuticular Monocryl.   Sterile dressing applied. She tolerated the procedure well with no   complications and was taken to the recovery room in good condition.

## 2018-05-04 NOTE — Brief Op Note (Signed)
BRIEF OPERATIVE NOTE    Date of Procedure: 05/04/2018   Preoperative Diagnosis: Malignant neoplasm of right female breast, unspecified estrogen receptor status, unspecified site of breast ([place]) [C50.911]  Postoperative Diagnosis: Malignant neoplasm of right female breast, unspecified estrogen receptor status, unspecified site of breast ([place]) [C50.911]    Procedure(s):  RIGHT BREAST LUMPECTOMY  RIGHT BREAST NEEDLE LOCALIZED BIOPSY  RIGHT BREAST SENTINEL NODE BIOPSY      Surgeon(s) and Role:     * Livian Vanderbeck, Patsy [place], MD - Primary         Surgical Assistant: none    Surgical Staff:  Circ-1: Dyane Dustman, RN  Scrub Tech-1: Lavonia Drafts  Scrub RN-1: Melissa Noon M  Event Time In Time Out   Incision Start 1235    Incision Close 1316      Anesthesia: General   Estimated Blood Loss: 71ml  Specimens:   ID Type Source Tests Collected by Time Destination   1 : right breast Fresh   Lorenda Cahill, MD 09/32/3557 1247 Pathology   2 : right breast deep medial margin Preservative   Lorenda Cahill, MD 32/20/2542 1255 Pathology   3 : right breast superior margin Preservative   Lorenda Cahill, MD 70/62/3762 1255 Pathology   4 : right breast inferior margin Preservative   Lorenda Cahill, MD 83/15/1761 1259 Pathology   5 : right sentinel node biopsy Preservative   Lorenda Cahill, MD 60/73/7106 1306 Pathology      Findings: as above    Complications: none  Implants: * No implants in log *

## 2018-05-04 NOTE — Op Note (Signed)
Breast Lumpectomy, needle localization with Sentinel Node    PRE-PROCEDURE DIAGNOSIS: right breast cancer     POSTOPERATIVE DIAGNOSIS: same    NAME OF PROCEDURE: right breast lumpectomy, needle localized, sentinel lymph node biopsy with lymphatic mapping    SURGEON: Marjory Lies, MD.     ASSISTANT: None.     ANESTHESIA: General.     ESTIMATED BLOOD LOSS: 58ml    SPECIMENS: lumpectomy, sentinel node, deep medial margin, superior margin, inferior margin    Drains: none     INDICATIONS: The patient is a 52yo. Risks, benefits, and alternatives were discussed in detail with the patient preoperatively. She understood the risks and wished to   proceed and consent was obtained.     PROCEDURE: She was taken to the operating room, placed on table in supine   position. Needle localization of the right breast mass was done in   radiology prior and the lymph node was mapped using radioactive tracer.  The right breast and axilla were prepped and draped in   routine sterile fashion. The sentinel lymph node portion was done first.   A  Small incision was made at the inferior border of the hairline in the   axilla and dissection was carefully performed through the subcutaneous   tissue to the axillary lymph nodes identified by neoprobe. The sentinel node was    identified.It was dissected out. All lymph nodes with sufficient increase in uptake were removed and sent to pathology. Irrigation was performed and   the wound closed in 2 layers with 3-0 Vicryl and 4-0 subcu Monocryl.   Sterile dressing was applied. Attention was then turned to the   lumpectomy. Incision was made along skin lines following the localization wire to the appropriate position..Dissection   around the mass was performed with electrocautery to include a generous margin.   Specimen radiograph showed removal of the proper tissue and the previously   placed biopsy clip.   Irrigation was performed of the cavity. Hemostasis was assured. The    tissues were closed with 3-0 Vicryl and 4-0 subcuticular Monocryl.   Sterile dressing applied. She tolerated the procedure well with no   complications and was taken to the recovery room in good condition.

## 2018-05-04 NOTE — Progress Notes (Signed)
Spiritual care and pre-op prayer given to pt.    Cindy Bishop, M.Div  Chaplain

## 2018-05-07 NOTE — Progress Notes (Signed)
Patient on report as eligible for Case Management.  Left discreet message on voicemail with this CM contact information.  Will attempt to contact again to offer [facility] [employer] Care Management services.

## 2018-05-07 NOTE — Progress Notes (Signed)
Patient on report as eligible for Case Management.  Left discreet message on voicemail with this CM contact information.  Will attempt to contact again to offer [facility] [employer] Care Management services.

## 2018-05-08 NOTE — Progress Notes (Signed)
Verified DOB and address for HIPAA security.  Introduced [employer] for patient.   Patient does not identify any Care Management needs at this time and declines services.  Patient states she is doing great, back to work.  She states that it is okay for ACM to contact her in the future.

## 2018-05-15 ENCOUNTER — Ambulatory Visit: Attending: Surgery | Primary: Family Medicine

## 2018-05-15 ENCOUNTER — Ambulatory Visit
Admit: 2018-05-15 | Discharge: 2018-05-15 | Payer: PRIVATE HEALTH INSURANCE | Attending: Surgery | Primary: Family Medicine

## 2018-05-15 DIAGNOSIS — C50111 Malignant neoplasm of central portion of right female breast: Secondary | ICD-10-CM

## 2018-05-15 DIAGNOSIS — Z170 Estrogen receptor positive status [ER+]: Secondary | ICD-10-CM

## 2018-05-15 NOTE — Progress Notes (Signed)
Post op follow up seen with Dr Iona Beard on 05/15/2018.  Patient was referred to medical oncology and will follow up with Surgical PRN.

## 2018-05-15 NOTE — Progress Notes (Signed)
Subjective:      Jill Kaufman is a 51 y.o. female presents for postop care 10 days following right breast lumpectomy    Appetite is good. Eating a regular diet without difficulty.   Bowel movements are regular.  The patient is voiding without difficulty.  The patient is not having any pain..    Objective:     Visit Vitals  Ht 5' 4"  (1.626 m)   Wt 148 lb (67.1 kg)   BMI 25.40 kg/m??       General:  alert, cooperative, no distress   Abdomen: soft, bowel sounds active, non-tender, no hernias   Incision:   healing well, no drainage, no erythema, no hernia, no seroma, no swelling, incision well approximated      DIAGNOSIS   A: ???RIGHT BREAST???: IN SITU AND INFILTRATING DUCTAL CARCINOMA, INTERMEDIATE GRADE. NO TUMOR IS IDENTIFIED AT INKED MARGINS OF RESECTION. SEE CAP SYNOPTIC REPORT BELOW.   PROCEDURE: Excision, needle localization lumpectomy.   SPECIMEN LATERALITY: Right.   TUMOR SIZE: 1.6 centimeters.   HISTOLOGIC TYPE: Infiltrating ductal carcinoma.   HISTOLOGIC GRADE: Intermediate grade.   GLANDULAR (ACINAR/TUBULAR DIFFERENTIATION): Score 2.   NUCLEAR PLEOMORPHISM: Score 2.   MITOTIC RATE: Score 2.   OVERALL GRADE: Grade 2 (scores of 6 or 7).   TUMOR FOCALITY: Single focus of invasive carcinoma.   DUCTAL CARCINOMA IN SITU (DCIS): Present.   ARCHITECTURAL PATTERNS OF DCIS: Cribriform.   HISTOLOGIC GRADE OF DCIS: Grade 2 (intermediate grade).   NECROSIS ASSOCIATED WITH DCIS: Not identified.   MARGINS: No carcinoma identified at inked margins of resection.   CLOSEST SPECIMEN MARGIN OF RESECTION: Superior margin (2 millimeters).   See additionally submitted superior margin in part C.   DCIS MARGINS: No DCIS identified at margins of resection   MICROCALCIFICATIONS: Present.   REGIONAL LYMPH NODES (SEE ALSO PART E):   NUMBER OF LYMPH NODES EXAMINED: 1   NUMBER OF LYMPH NODES INVOLVED BY METASTATIC CARCINOMA: 0   LYMPHOVASCULAR INVASION: Not identified.   PATHOLOGIC STAGE CLASSIFICATION: pT1c N0 (sn) ([facility], 8TH EDITION, 2017).    BREAST BIOMARKER TESTING: Previously performed per [facility] (Estrogen receptor positive, progesterone receptor positive, Her-2/neu negative. Previous pathology report not available ).   B: ???RIGHT BREAST DEEP MEDIAL MARGIN???: NO CARCINOMA IDENTIFIED.   C: ???RIGHT BREAST SUPERIOR MARGIN???: NO CARCINOMA IDENTIFIED.   D: ???RIGHT BREAST INFERIOR MARGIN???: NO CARCINOMA IDENTIFIED. [facility] Surgical Pathology Report [id number]   Account Number: [id number] KEANNA, TUGWELL Page 2 of 3       Referral to oncology for adjuvant therapy   E: ???RIGHT SENTINEL NODE BIOPSY???: LYMPH NODE TISSUE, NO CARCINOMA IDENTIFIED. SEE SUMMARY STAGING ABOVE.   Assessment:     Doing well postoperatively.    Plan:     1. Continue any current medications.  2. Wound care discussed.  3. Pt is to increase activities as tolerated.  4. Follow up PRN

## 2018-05-15 NOTE — Progress Notes (Signed)
Subjective:      Jill Kaufman is a 52 y.o. female presents for postop care 10 days following right breast lumpectomy    Appetite is good. Eating a regular diet without difficulty.   Bowel movements are regular.  The patient is voiding without difficulty.  The patient is not having any pain..    Objective:     Visit Vitals  Ht 5' 4"  (1.626 m)   Wt 148 lb (67.1 kg)   BMI 25.40 kg/m??       General:  alert, cooperative, no distress   Abdomen: soft, bowel sounds active, non-tender, no hernias   Incision:   healing well, no drainage, no erythema, no hernia, no seroma, no swelling, incision well approximated      DIAGNOSIS   A: ???RIGHT BREAST???: IN SITU AND INFILTRATING DUCTAL CARCINOMA, INTERMEDIATE GRADE. NO TUMOR IS IDENTIFIED AT INKED MARGINS OF RESECTION. SEE CAP SYNOPTIC REPORT BELOW.   PROCEDURE: Excision, needle localization lumpectomy.   SPECIMEN LATERALITY: Right.   TUMOR SIZE: 1.6 centimeters.   HISTOLOGIC TYPE: Infiltrating ductal carcinoma.   HISTOLOGIC GRADE: Intermediate grade.   GLANDULAR (ACINAR/TUBULAR DIFFERENTIATION): Score 2.   NUCLEAR PLEOMORPHISM: Score 2.   MITOTIC RATE: Score 2.   OVERALL GRADE: Grade 2 (scores of 6 or 7).   TUMOR FOCALITY: Single focus of invasive carcinoma.   DUCTAL CARCINOMA IN SITU (DCIS): Present.   ARCHITECTURAL PATTERNS OF DCIS: Cribriform.   HISTOLOGIC GRADE OF DCIS: Grade 2 (intermediate grade).   NECROSIS ASSOCIATED WITH DCIS: Not identified.   MARGINS: No carcinoma identified at inked margins of resection.   CLOSEST SPECIMEN MARGIN OF RESECTION: Superior margin (2 millimeters).   See additionally submitted superior margin in part C.   DCIS MARGINS: No DCIS identified at margins of resection   MICROCALCIFICATIONS: Present.   REGIONAL LYMPH NODES (SEE ALSO PART E):   NUMBER OF LYMPH NODES EXAMINED: 1   NUMBER OF LYMPH NODES INVOLVED BY METASTATIC CARCINOMA: 0   LYMPHOVASCULAR INVASION: Not identified.   PATHOLOGIC STAGE CLASSIFICATION: pT1c N0 (sn) ([facility], 8TH EDITION, 2017).    BREAST BIOMARKER TESTING: Previously performed per [facility] (Estrogen receptor positive, progesterone receptor positive, Her-2/neu negative. Previous pathology report not available ).   B: ???RIGHT BREAST DEEP MEDIAL MARGIN???: NO CARCINOMA IDENTIFIED.   C: ???RIGHT BREAST SUPERIOR MARGIN???: NO CARCINOMA IDENTIFIED.   D: ???RIGHT BREAST INFERIOR MARGIN???: NO CARCINOMA IDENTIFIED. [facility] Surgical Pathology Report [id number]   Account Number: [id number] KEANNA, TUGWELL Page 2 of 3       Referral to oncology for adjuvant therapy   E: ???RIGHT SENTINEL NODE BIOPSY???: LYMPH NODE TISSUE, NO CARCINOMA IDENTIFIED. SEE SUMMARY STAGING ABOVE.   Assessment:     Doing well postoperatively.    Plan:     1. Continue any current medications.  2. Wound care discussed.  3. Pt is to increase activities as tolerated.  4. Follow up PRN

## 2018-05-15 NOTE — Progress Notes (Signed)
Post op follow up seen with Dr Domenica Fail on 16/11/628.  Patient was referred to medical oncology and will follow up with Surgical PRN.

## 2018-05-15 NOTE — Patient Instructions (Signed)
Patient Instructions From Your Nurse    Reason for Visit:  Post op follow up      Plan:  -Patient will be referred to medical oncology    Follow Up:  -Will receive a call form the [facility] cancer center to schedule appointment            _____________________________________________________________________    ?? Call office if temperature greater than 101 degrees after surgery  ?? Patient does an interest in My Chart.  My Chart log in information explained on the after visit summary printout at the [place] desk.      Aislee Landgren M. Raul Del, BSN, RN  Surgical Nurse Navigator  [email or web address]    [address], [address]  [place]  [address]  [phone]  Cell:  [phone]      CARE INSTRUCTIONS:  Your surgeon has given you verbal instructions regarding your plan of care today.    FOLLOW -UP AFTER A TEST OR STUDY:  If you are having labs drawn or a radiologic study, there will be a follow-up appointment needed after that, which either has already been made for you today or will need to be made by you to review the results of your study or test (unless special arrangements were made today for you by your surgeon).    We are unable to review results over the phone because those results invariably lead to questions that you or your surgeon need to make together during an appointment.  Therefore, please do not call the office for test results as those should be discussed at your next appointment, and if you don't have a next appointment, please call and get one after your test is completed, or as you were instructed.    AFTER - HOURS CALLS:  We have a surgeon-call for emergencies 24 hours a day.  After the office is closed, which is usually at [place] and on weekends and holidays, the on-call surgeon can be reached by dialing our office number [phone]).    Please do not call unless you are having an urgent or emergent issue.  The on-call surgeon cannot make appointment changes or call in refills for  pain medications after the office is closed.

## 2018-05-17 NOTE — Progress Notes (Signed)
05/17/2018  Oncotype DX ordered per tumor board discussion, requisition #[id number]

## 2018-05-17 NOTE — Progress Notes (Signed)
05/17/2018  Oncotype DX ordered per tumor board discussion, requisition #[id number]

## 2018-06-11 NOTE — Progress Notes (Signed)
 New Patient Abstract    Reason for Referral:  Malignant neoplasm of central portion of right breast in female, estrogen receptor positive     Referring Provider:  Dr. Dail    Primary Care Provider:  None documented    Family History of Cancer/Hematologic disorders:  Father, Esophageal Ca    Presenting Symptoms:  Abnormal Screening Mammogram    Narrative with recent Results/Procedures/Biopsies and Dates completed:  [name] is a 52 year old female.  [name] presented for screening mammogram and an irregularity was detected.  Additional imaging was recommended.  Targeted US  confirmed presence of an irregular hypoechoic mass and a biopsy was performed for further evaluation.  Core biopsy resulted with diagnosis of IDC (no DCIS/LCIS), ER/PR+, HER2/NEU negative results.  Post biopsy MRI revealed Right lateral 9:00 biopsy site demonstrates a clip within an irregular heterogeneously enhancing mass measuring up to 1.3 x 1.0 x 1.5 cm.  Dr. Dail discussed surgical options including lumpectomy and mastectomy and the risks and benefits of both.  Pt would prefer breast conservation and is an excellent candidate.  [name] underwent a wire localized right breast lumpectomy and sentinel lymph node biopsy on 05/04/18 confirming core biopsy findings with DCIS present in lumpectomy specimen.  [name] is referred to us  for adjuvant therapy.    03/05/18 MAMMOGRAM SCREENING  FINDINGS: 2-D digital mammography and digital tomosynthesis were performed.   An irregular 1.3 x 1.0 x 1.1 cm equal density mass with spiculated margins and associated architectural distortion is noted in the upper outer aspect of the right breast posteriorly (8 cm from the nipple). Further evaluation indicated.  No significant changes of the left breast when compared to prior images.  IMPRESSION:  1. Incomplete right mammogram.  2. Recommendation: Needs additional imaging evaluation. Additional mammographic views and/or ultrasound  recommended    03/07/18 RIGHT BREAST TARGETED ULTRASOUND  Findings: Targeted ultrasound of the right breast was performed by the technologist with attention to the mammographic finding in the upper outer quadrant. There is a corresponding irregular hypoechoic mass with angular and indistinct margins at 9:00 9 cm from the nipple measuring 1.1 x 0.7 x 1.1 cm. Color Doppler shows no internal vascularity. Right axillary ultrasound is unremarkable.  IMPRESSION:  Right breast mass at 9:00 is highly suggestive of malignancy. An ultrasound-guided biopsy is recommended.    03/13/18 PATHOLOGY DIAGNOSIS  BREAST, RIGHT, 9 O'CLOCK, ULTRASOUND-GUIDED CORE NEEDLE BIOPSY:  - INVASIVE DUCTAL CARCINOMA (SEE SYNOPTIC REPORT).  Procedure: Ultrasound-guided core needle biopsy  Specimen and Laterality: Right breast  Tumor Site (Clock Position): 9 o'clock   Histologic Type: Invasive carcinoma of no special type (ductal, not   otherwise specified)  Histologic Grade (Nottingham Histologic Score)  - Glandular (Acinar) / Tubular Differentiation: Score 2  - Nuclear Pleomorphism: Score 3  - Mitotic Rate: Score 1  - Overall Grade: Grade 2  Ductal Carcinoma In Situ (DCIS): No DCIS in specimen  Lobular Carcinoma In Situ (LCIS): No LCIS in specimen  Lymphovascular Invasion: Not identified  Microcalcifications: Present in non-neoplastic tissue and in invasive carcinoma  Ancillary studies: See biomarker report  Breast, right, 9 o'clock, ultrasound-guided core needle biopsy  Tumor type: Breast carcinoma  Primary vs. Metastatic: Primary  ESTROGEN RECEPTOR: POSITIVE  - Nuclear Staining: 95%  - Average intensity of staining: strong  PROGESTERONE RECEPTOR: POSITIVE  - Nuclear Staining: 95%  - Average intensity of staining: strong  KI-67: BORDERLINE  - Nuclear Staining: 15%  HER2 PROTEIN EXPRESSION: NEGATIVE (Score 0)  -  Staining pattern: Membrane staining that is incomplete and is   faint/barely perceptible and in less than  or equal to 10% of tumor Cells    03/26/18 MRI BREAST BI W WO CONT  FINDINGS: The breasts demonstrate moderate bilateral glandularity, and moderate bilateral background enhancement which could limit accuracy. There are innumerable tiny enhancing foci and a few scattered small cysts on each side.  Right lateral 9:00 biopsy site demonstrates a clip within an irregular heterogeneously enhancing mass measuring up to 1.3 x 1.0 x 1.5 cm.  There is no evidence of suspicious enhancing mass, and no dominant or unique nonmass enhancement, to suggest additional malignancy in either breast.  There is no evidence of axillary or internal mammary lymphadenopathy. Chest wall signal is normal.   IMPRESSION:   1. Right 9:00 breast cancer measures up to 1.5 cm.  2. No additional discrete abnormality in either breast. Moderate background enhancement.  3. No evidence of lymphadenopathy or chest wall abnormalities.  Recommend continued management as directed clinically. Annual bilateral mammogram will be due in September 2020. This patient would likely benefit from supplemental breast MRI in one year as well.    05/04/18 PATHOLOGY DIAGNOSIS  A: "RIGHT BREAST": IN SITU AND INFILTRATING DUCTAL CARCINOMA, INTERMEDIATE GRADE. NO TUMOR IS IDENTIFIED AT INKED MARGINS OF RESECTION. SEE CAP SYNOPTIC REPORT BELOW.   PROCEDURE: Excision, needle localization lumpectomy.   SPECIMEN LATERALITY: Right.   TUMOR SIZE: 1.6 centimeters.   HISTOLOGIC TYPE: Infiltrating ductal carcinoma.   HISTOLOGIC GRADE: Intermediate grade.   GLANDULAR (ACINAR/TUBULAR DIFFERENTIATION): Score 2.   NUCLEAR PLEOMORPHISM: Score 2.   MITOTIC RATE: Score 2.   OVERALL GRADE: Grade 2 (scores of 6 or 7).   TUMOR FOCALITY: Single focus of invasive carcinoma.   DUCTAL CARCINOMA IN SITU (DCIS): Present.   ARCHITECTURAL PATTERNS OF DCIS: Cribriform.   HISTOLOGIC GRADE OF DCIS: Grade 2 (intermediate grade).   NECROSIS ASSOCIATED WITH DCIS: Not identified.   MARGINS: No carcinoma  identified at inked margins of resection.   CLOSEST SPECIMEN MARGIN OF RESECTION: Superior margin (2 millimeters).   See additionally submitted superior margin in part C.   DCIS MARGINS: No DCIS identified at margins of resection   MICROCALCIFICATIONS: Present.   REGIONAL LYMPH NODES (SEE ALSO PART E):   NUMBER OF LYMPH NODES EXAMINED: 1   NUMBER OF LYMPH NODES INVOLVED BY METASTATIC CARCINOMA: 0   LYMPHOVASCULAR INVASION: Not identified.   PATHOLOGIC STAGE CLASSIFICATION: pT1c N0 (sn) ([facility], 8TH EDITION, 2017).   BREAST BIOMARKER TESTING: Previously performed per [facility] (Estrogen receptor positive, progesterone receptor positive, Her-2/neu negative. Previous pathology report not available).   B: "RIGHT BREAST DEEP MEDIAL MARGIN": NO CARCINOMA IDENTIFIED.   C: "RIGHT BREAST SUPERIOR MARGIN": NO CARCINOMA IDENTIFIED.   D: "RIGHT BREAST INFERIOR MARGIN": NO CARCINOMA IDENTIFIED.   E: "RIGHT SENTINEL NODE BIOPSY": LYMPH NODE TISSUE, NO CARCINOMA IDENTIFIED. SEE SUMMARY STAGING ABOVE.   STF - ONCOTYPE DX ASSAY   Interpretation:  [facility] Oncotype DX Breast Recurrence Score Result: 6.     Notes from referring Provider:  Referral to oncology for adjuvant therapy     Other pertinent information:  PMHx includes hypothyroidism, hyperlipidemia, Vit D deficiency, hypercholesterolemia    Presented at Tumor Board:  05/17/18

## 2018-06-11 NOTE — Progress Notes (Signed)
New Patient Abstract    Reason for Referral:  Malignant neoplasm of central portion of right breast in female, estrogen receptor positive     Referring Provider:  Dr. Domenica Fail    Primary Care Provider:  None documented    Family History of Cancer/Hematologic disorders:  Father, Esophageal Ca    Presenting Symptoms:  Abnormal Screening Mammogram    Narrative with recent Results/Procedures/Biopsies and Dates completed:  Jill Kaufman is a 52 year old female.  Jill Kaufman presented for screening mammogram and an irregularity was detected.  Additional imaging was recommended.  Targeted US confirmed presence of an irregular hypoechoic mass and a biopsy was performed for further evaluation.  Core biopsy resulted with diagnosis of IDC (no DCIS/LCIS), ER/PR+, HER2/NEU negative results.  Post biopsy MRI revealed Right lateral 9:00 biopsy site demonstrates a clip within an irregular heterogeneously enhancing mass measuring up to 1.3 x 1.0 x 1.5 cm.  Dr. Domenica Fail discussed surgical options including lumpectomy and mastectomy and the risks and benefits of both.  Jill Kaufman would prefer breast conservation and is an excellent candidate.  Jill Kaufman underwent a wire localized right breast lumpectomy and sentinel lymph node biopsy on 05/04/18 confirming core biopsy findings with DCIS present in lumpectomy specimen.  Jill Kaufman is referred to [place] for adjuvant therapy.    03/05/18 MAMMOGRAM SCREENING  FINDINGS: 2-D digital mammography and digital tomosynthesis were performed.   An irregular 1.3 x 1.0 x 1.1 cm equal density mass with spiculated margins and associated architectural distortion is noted in the upper outer aspect of the right breast posteriorly (8 cm from the nipple). ??Further evaluation indicated.  No significant changes of the left breast when compared to prior images.  IMPRESSION:  1. Incomplete right mammogram.  2. Recommendation: Needs additional imaging evaluation. Additional mammographic views and/or ultrasound recommended     03/07/18 RIGHT BREAST TARGETED ULTRASOUND  Findings: Targeted ultrasound of the right breast was performed by the technologist with attention to the mammographic finding in the upper outer quadrant. ??There is a corresponding irregular hypoechoic mass with angular and indistinct margins at 9:00 9 cm from the nipple measuring 1.1 x 0.7 x 1.1 cm. Color Doppler shows no internal vascularity. ??Right axillary ultrasound is unremarkable.  IMPRESSION:  Right breast mass at 9:00 is highly suggestive of malignancy. ??An ultrasound-guided biopsy is recommended.    03/13/18 PATHOLOGY DIAGNOSIS  BREAST, RIGHT, 9 O'CLOCK, ULTRASOUND-GUIDED CORE NEEDLE BIOPSY:  - INVASIVE DUCTAL CARCINOMA (SEE SYNOPTIC REPORT).  Procedure: ??Ultrasound-guided core needle biopsy  Specimen and Laterality: ??Right breast  Tumor Site (Clock Position): ??9 o'clock   Histologic Type: ??Invasive carcinoma of no special type (ductal, not   otherwise specified)  Histologic Grade (Nottingham Histologic Score)  - Glandular (Acinar) / Tubular Differentiation: ??Score 2  - Nuclear Pleomorphism: ??Score 3  - Mitotic Rate: ??Score 1  - Overall Grade: ??Grade 2  Ductal Carcinoma In Situ (DCIS): ??No DCIS in specimen  Lobular Carcinoma In Situ (LCIS): ??No LCIS in specimen  Lymphovascular Invasion: ??Not identified  Microcalcifications: ??Present in non-neoplastic tissue ??and in invasive carcinoma  Ancillary studies: ??See biomarker report  Breast, ??right, ??9 o'clock, ??ultrasound-guided core needle biopsy  Tumor type: ??Breast carcinoma  Primary vs. Metastatic: ??Primary  ESTROGEN RECEPTOR: ??POSITIVE  - Nuclear Staining: ??95%  - Average intensity of staining: ??strong  PROGESTERONE RECEPTOR: ??POSITIVE  - Nuclear Staining: ??95%  - Average intensity of staining: ??strong  KI-67: ??BORDERLINE  - Nuclear Staining: ??15%  HER2 PROTEIN EXPRESSION: ??NEGATIVE (Score 0)  -  Staining pattern: ??Membrane staining that is incomplete and is    faint/barely perceptible and in less than or equal to 10% of tumor Cells    03/26/18 MRI BREAST BI W WO CONT  FINDINGS: The breasts demonstrate moderate bilateral glandularity, and moderate bilateral background enhancement which could limit accuracy. There are innumerable tiny enhancing foci and a few scattered small cysts on each side.  Right lateral 9:00 biopsy site demonstrates a clip within an irregular heterogeneously enhancing mass measuring up to 1.3 x 1.0 x 1.5 cm.  There is no evidence of suspicious enhancing mass, and no dominant or unique nonmass enhancement, to suggest additional malignancy in either breast.  There is no evidence of axillary or internal mammary lymphadenopathy. Chest wall signal is normal.   IMPRESSION:   1. Right 9:00 breast cancer measures up to 1.5 cm.  2. No additional discrete abnormality in either breast. Moderate background enhancement.  3. No evidence of lymphadenopathy or chest wall abnormalities.  Recommend continued management as directed clinically. Annual bilateral mammogram will be due in September 2020. This patient would likely benefit from supplemental breast MRI in one year as well.    05/04/18 PATHOLOGY DIAGNOSIS  A: ???RIGHT BREAST???: IN SITU AND INFILTRATING DUCTAL CARCINOMA, INTERMEDIATE GRADE. NO TUMOR IS IDENTIFIED AT INKED MARGINS OF RESECTION. SEE CAP SYNOPTIC REPORT BELOW.   PROCEDURE: Excision, needle localization lumpectomy.   SPECIMEN LATERALITY: Right.   TUMOR SIZE: 1.6 centimeters.   HISTOLOGIC TYPE: Infiltrating ductal carcinoma.   HISTOLOGIC GRADE: Intermediate grade.   GLANDULAR (ACINAR/TUBULAR DIFFERENTIATION): Score 2.   NUCLEAR PLEOMORPHISM: Score 2.   MITOTIC RATE: Score 2.   OVERALL GRADE: Grade 2 (scores of 6 or 7).   TUMOR FOCALITY: Single focus of invasive carcinoma.   DUCTAL CARCINOMA IN SITU (DCIS): Present.   ARCHITECTURAL PATTERNS OF DCIS: Cribriform.   HISTOLOGIC GRADE OF DCIS: Grade 2 (intermediate grade).    NECROSIS ASSOCIATED WITH DCIS: Not identified.   MARGINS: No carcinoma identified at inked margins of resection.   CLOSEST SPECIMEN MARGIN OF RESECTION: Superior margin (2 millimeters).   See additionally submitted superior margin in part C.   DCIS MARGINS: No DCIS identified at margins of resection   MICROCALCIFICATIONS: Present.   REGIONAL LYMPH NODES (SEE ALSO PART E):   NUMBER OF LYMPH NODES EXAMINED: 1   NUMBER OF LYMPH NODES INVOLVED BY METASTATIC CARCINOMA: 0   LYMPHOVASCULAR INVASION: Not identified.   PATHOLOGIC STAGE CLASSIFICATION: pT1c N0 (sn) ([facility], 8TH EDITION, 2017).   BREAST BIOMARKER TESTING: Previously performed per [facility] (Estrogen receptor positive, progesterone receptor positive, Her-2/neu negative. Previous pathology report not available).   B: ???RIGHT BREAST DEEP MEDIAL MARGIN???: NO CARCINOMA IDENTIFIED.   C: ???RIGHT BREAST SUPERIOR MARGIN???: NO CARCINOMA IDENTIFIED.   D: ???RIGHT BREAST INFERIOR MARGIN???: NO CARCINOMA IDENTIFIED.   E: ???RIGHT SENTINEL NODE BIOPSY???: LYMPH NODE TISSUE, NO CARCINOMA IDENTIFIED. SEE SUMMARY STAGING ABOVE.   STF - ONCOTYPE DX ASSAY   Interpretation:  [facility] Oncotype DX Breast Recurrence Score?? Result: 6.     Notes from referring Provider:  Referral to oncology for adjuvant therapy     Other pertinent information:  PMHx includes hypothyroidism, hyperlipidemia, Vit D deficiency, hypercholesterolemia    Presented at Tumor Board:  05/17/18

## 2018-06-18 ENCOUNTER — Ambulatory Visit: Attending: Internal Medicine | Primary: Family Medicine

## 2018-06-18 ENCOUNTER — Ambulatory Visit
Admit: 2018-06-18 | Discharge: 2018-06-18 | Payer: PRIVATE HEALTH INSURANCE | Attending: Internal Medicine | Primary: Family Medicine

## 2018-06-18 ENCOUNTER — Inpatient Hospital Stay: Admit: 2018-06-18 | Discharge: 2018-06-18 | Payer: PRIVATE HEALTH INSURANCE | Primary: Family Medicine

## 2018-06-18 DIAGNOSIS — C50111 Malignant neoplasm of central portion of right female breast: Secondary | ICD-10-CM

## 2018-06-18 DIAGNOSIS — Z170 Estrogen receptor positive status [ER+]: Secondary | ICD-10-CM

## 2018-06-18 LAB — FOLLICLE STIMULATING HORMONE
FSH, Serum: 41.7 m[IU]/mL
FSH: 41.7 m[IU]/mL

## 2018-06-18 LAB — ESTRADIOL
ESTRADIOL,ESTRD: 16.15 pg/mL
Estradiol: 16.15 pg/mL

## 2018-06-18 LAB — LUTEINIZING HORMONE
Luteinizing Hormone: 18.2 m[IU]/mL
Luteinizing hormone: 18.2 m[IU]/mL

## 2018-06-18 NOTE — Progress Notes (Signed)
06/18/2017 Saw the patient with Dr Jenetta Loges. She was recently diagnosed with breast cancer.  Dr Jenetta Loges discussed with her that chemo would be of little benefit.  We will draw hormone levels today. I have given her printed information regarding Tamoxifen.  We will refer her to radiation.  Navigation information given to the patient.  Will continue to follow.

## 2018-06-18 NOTE — Progress Notes (Signed)
Progress Notes by Jill Ee, MD at 06/18/18 1330                Author: Greer Ee, MD  Service: --  Author Type: Physician       Filed: 06/18/18 1407  Encounter Date: 06/18/2018  Status: Signed          Editor: Jill Ee, MD (Physician)               [facility] Hematology and Oncology: Office Visit New Patient H & P      Chief Complaint:       Chief Complaint       Patient presents with        ?  New Patient              History of Present Illness:   Jill Kaufman is a  53 y.o. female who presents today for evaluation regarding breast cancer.  She underwent  routine mammogram in September 2019 which showed an irregular mass density in the upper outer right breast.  She underwent ultrasound and biopsy which showed invasive ductal carcinoma, grade 2, ER 95%, PR 95%, HER2 negative.  MRI showed the lesion to  be 1.5 cm with no other abnormalities.  She was referred to Dr. Domenica Fail and underwent lumpectomy and sentinel node biopsy on 05/04/18 which showed 1.6 cm of tumor with negative margins and 1 sentinel lymph node negative for tumor.  She now presents to  medical oncology for recommendations.  No complaints, recovered completely from surgery.            Review of Systems:   Constitutional: Negative.    HENT: Negative.    Eyes: Negative.    Respiratory: Negative.    Cardiovascular: Negative.    Gastrointestinal: Negative.    Genitourinary: Negative.    Musculoskeletal: Negative.    Skin: Negative.    Neurological: Negative.    Endo/Heme/Allergies: Negative.    Psychiatric/Behavioral: Negative.    All other systems reviewed and are negative.         Allergies        Allergen  Reactions         ?  Codeine  Vertigo          Past Medical History:        Diagnosis  Date         ?  Breast cancer ([place])            T1cN0 right breast cancer         ?  GERD (gastroesophageal reflux disease)            OTC meds as needed          ?  High cholesterol       ?  Nausea & vomiting       ?  Thyroid disease   06/14/2011          Hypothyroidism           Past Surgical History:         Procedure  Laterality  Date          ?  HX BREAST BIOPSY  Right  05/04/2018          RIGHT BREAST NEEDLE LOCALIZED BIOPSY performed by Lorenda Cahill, MD at [place]          ?  HX BREAST LUMPECTOMY  Right  05/04/2018  RIGHT BREAST LUMPECTOMY performed by Lorenda Cahill, MD at [place]          ?  HX GYN              hysterectomy          Family History         Problem  Relation  Age of Onset          ?  Heart Disease  Mother       ?  Cancer  Father                eshophageal cancer, skin cancer           ?  Diabetes  Father       ?  Heart Disease  Father            ?  Stroke  Father            Social History          Socioeconomic History         ?  Marital status:  MARRIED              Spouse name:  Not on file         ?  Number of children:  Not on file     ?  Years of education:  Not on file     ?  Highest education level:  Not on file       Occupational History        ?  Not on file       Social Needs         ?  Financial resource strain:  Not on file        ?  Food insecurity:              Worry:  Not on file         Inability:  Not on file        ?  Transportation needs:              Medical:  Not on file         Non-medical:  Not on file       Tobacco Use         ?  Smoking status:  Never Smoker     ?  Smokeless tobacco:  Never Used       Substance and Sexual Activity         ?  Alcohol use:  Yes             Comment: occas         ?  Drug use:  Not Currently     ?  Sexual activity:  Not on file       Lifestyle        ?  Physical activity:              Days per week:  Not on file         Minutes per session:  Not on file         ?  Stress:  Not on file       Relationships        ?  Social connections:              Talks on phone:  Not on file         Gets together:  Not  on file         Attends religious service:  Not on file         Active member of club or organization:  Not on file         Attends meetings of  clubs or organizations:  Not on file         Relationship status:  Not on file        ?  Intimate partner violence:              Fear of current or ex partner:  Not on file         Emotionally abused:  Not on file         Physically abused:  Not on file         Forced sexual activity:  Not on file        Other Topics  Concern        ?  Not on file       Social History Narrative        ?  Not on file          Current Outpatient Medications          Medication  Sig  Dispense  Refill           ?  pitavastatin calcium (LIVALO PO)  Take 1 mg by mouth nightly.               ?  levothyroxine (SYNTHROID) 75 mcg tablet  Take 75 mcg by mouth Daily (before breakfast).               OBJECTIVE:   Visit Vitals       BP  (!) 150/95  Comment: standing        Pulse  81     Temp  98.9 ??F (37.2 ??C) (Oral)     Resp  18         Ht  5' 3.5" (1.613 m)  Comment: taken w/o shoes        Wt  148 lb (67.1 kg)     SpO2  97%        BMI  25.81 kg/m??           Physical Exam:      Constitutional:  Well developed, well nourished female in no acute  distress, sitting comfortably on the examination table.         HEENT:  Normocephalic and atraumatic. Oropharynx is clear, mucous membranes are moist.  Sclerae anicteric. Neck supple without JVD. No thyromegaly present.      Lymph node     No palpable submandibular, cervical, supraclavicular lymph nodes.     Skin  Warm and dry.  No bruising and no rash noted.  No erythema.  No pallor.      Respiratory  Lungs are clear to auscultation bilaterally without wheezes, rales or rhonchi, normal air exchange without accessory muscle use.      CVS  Normal rate, regular rhythm and normal S1 and S2.  No murmurs, gallops, or rubs.     Abdomen  Soft, nontender and nondistended, normoactive bowel sounds.  No palpable mass.  No hepatosplenomegaly.     Neuro  Grossly nonfocal with no obvious sensory or motor deficits.     MSK  Normal range of motion in general.  No edema and no tenderness.     Psych  Appropriate mood and  affect.  Labs:   No results found for this or any previous visit (from the past 24 hour(s)).      Imaging:   MRI of the Breasts with and without contrast   ??   CLINICAL INDICATION:  New diagnosis of right 9:00 breast cancer status post   biopsy on 10/1/2019at [facility] demonstrating invasive ductal   carcinoma grade 2. Evaluate for extent of disease, staging, therapy planning. No   reported personal malignancy or breast surgery otherwise, and no family breast   or ovarian cancer.   ??   COMPARISON: Outside mammography and ultrasound from [facility]   03/13/2018, 03/07/2018, 02/27/2018   ??   TECHNIQUE: Standard MRI sequences were obtained through the breasts in multiple   planes. Images were obtained before and after intravenous infusion of 13 mL of   Dotarem contrast. Images were reviewed with PACS and with Dynacad CAD software.   ??   FINDINGS: The breasts demonstrate moderate bilateral glandularity, and moderate   bilateral background enhancement which could limit accuracy. There are   innumerable tiny enhancing foci and a few scattered small cysts on each side.   ??   Right lateral 9:00 biopsy site demonstrates a clip within an irregular   heterogeneously enhancing mass measuring up to 1.3 x 1.0 x 1.5 cm.   ??   There is no evidence of suspicious enhancing mass, and no dominant or unique   nonmass enhancement, to suggest additional malignancy in either breast.  There   is no evidence of axillary or internal mammary lymphadenopathy. Chest wall   signal is normal.    ??   ??   IMPRESSION   IMPRESSION:    1. Right 9:00 breast cancer measures up to 1.5 cm.   2. No additional discrete abnormality in either breast. Moderate background   enhancement.   3. No evidence of lymphadenopathy or chest wall abnormalities.   ??   Recommend continued management as directed clinically. Annual bilateral   mammogram will be due in September 2020. This patient would likely benefit from   supplemental  breast MRI in one year as well.   ??   BI-RADS Assessment Category 6: Known Biopsy Proven Malignancy   ??   ??   Pathology:                      ASSESSMENT:             ICD-10-CM  ICD-9-CM             1.  Malignant neoplasm of central portion of right breast in female, estrogen receptor positive ([place])  W23.762  831.5  FOLLICLE STIMULATING HORMONE            Z17.0  V86.0  LUTEINIZING HORMONE           ESTRADIOL                REFERRAL TO RADIATION ONCOLOGY           Problem List   Date Reviewed:  July 18, 2018                        Codes  Class  Noted             S/P lumpectomy of breast  ICD-10-CM: V76.160   ICD-9-CM: V45.89    05/17/2018  Malignant neoplasm of central portion of right breast in female, estrogen receptor positive ([place])  ICD-10-CM: C50.111, Z17.0   ICD-9-CM: 174.1, V86.0    04/12/2018                             PLAN:   Lab studies and imaging studies were personally reviewed.      Pertinent old records were reviewed.       Case discussed with Dr. Domenica Fail and others at [facility].      Breast cancer: 1.6 cm, grade 2 IDC, sentinel lymph node biopsy negative, ER 9%, PR 95%, HER2 negative.  S/p lumpectomy with negative margins.  She has completed definitive surgery.  Because of  the ER/PR positivity, the negative nodal assessment, and the T1 tumor, she would be an excellent candidate for Oncotype Dx for risk stratification, which was low risk at 6.  Therefore, chemotherapy can be safely deferred.  We will refer her for radiation  therapy, then begin endocrine therapy.  She has completed hysterectomy but her ovaries are intact, since she is 53 years old we will check her FSH/LH/E2 levels to determine if she is still premenopausal.  If so, we will proceed with tamoxifen for 2-3  years, then transition to AI once she is post-menopausal.  If she is already post-menopausal by serologies, we will give AI instead.  We discussed side effects of tamoxifen including the risk of VTE,  as well as  vasomotor symptoms and arthralgias.  She understands and agrees to proceed.  All questions were asked and answered to the best of my ability.  F/u in about 3 months for recheck on endocrine therapy.                   Jill Ee, MD   [facility] [place] [address]   Office : [phone]   Fax : [phone]

## 2018-06-18 NOTE — Progress Notes (Signed)
Her labs are borderline for menopause - we will proceed with tamoxifen for now, consider 2 years of therapy then change to aromatase inhibitor.

## 2018-06-18 NOTE — Patient Instructions (Signed)
Patient Instructions from Today's Visit    Reason for Visit:  Breast Cancer     Diagnosis Information:  https://www.cancer.net/about-us/asco-answers-patient-education-materials/asco-answers-fact-sheets  Patient was educated and given handouts published by [employer] entitled ???ASCO Answers Fact Sheets??? about their diagnosis of Breast Cancer during today???s office visit.     Plan:  You would benefit from hormone blocking pill called Tamoxifen  Tamoxifen     Select Language??   (ta MOK[facility] i fen)   Trade names: Novladex??  [employer] uses generic names in all descriptions of drugs. Novladex is the trade name for tamoxifen. In some cases, health care professionals may use the trade name Novladex when referring to the generic drug name tamoxifen.  Drug type: Tamoxifen is a hormone therapy. This medication is classified as an "anti-estrogen." (For more detail, see "How this drug works" section below).  What this drug is used for:  Tamoxifen may be given as adjuvant therapy (treatment after successful surgery) in women or men with lymph node negative or lymph node positive breast cancer. Cancers with positive estrogen and progesterone receptors are more likely to benefit from tamoxifen. Tamoxifen reduces the risk of getting breast cancer in the opposite breast.   Tamoxifen may be prescribed in metastatic (cancer that has spread) breast cancer in both women and men.   Tamoxifen may be prescribed in women with ductal carcinoma in situ (DCIS) who have completed surgery and radiation therapy. Tamoxifen may reduce the risk of invasive breast cancer. Risks and benefits of tamoxifen therapy should be discussed in this setting.   Tamoxifen may be prescribed for women at high risk of breast cancer to reduce the incidence of developing breast cancer. Risks and benefits of tamoxifen therapy should be discussed in this setting.   Tamoxifen may also be prescribed for treatment of ovarian cancer.   Note: If a drug has been approved for one use, physicians may elect to use this same drug for other problems if they believe it may be helpful.   How this drug is given:  Tamoxifen is a pill, given by mouth. The pill should be swallowed whole.   Tamoxifen should be taken at about the same time each day with a full glass of water. If you miss a dose, do not take a double dose the next day.   The amount of tamoxifen that you will receive depends on many factors, including your general health or other health problems, and the type of cancer or condition being treated. Your doctor will determine your dose, schedule and duration of treatment.  Side effects:   Important things to remember about the side effects of tamoxifen:  Most people do not experience all of the side effects listed.   Side effects are often predictable in terms of their onset and duration.   Side effects are almost always reversible and will go away after treatment is complete.   There are many options to help minimize or prevent side effects.   There is no relationship between the presence or severity of side effects and the effectiveness of the medication.  The following side effects are common (occurring in greater than 30%) for patients taking tamoxifen:  Hot flashes (see sexuality)   Vaginal discharge (see sexuality)   Swelling (fluid retention in feet, ankles, or hands)   Loss of libido (particularly in men) (see sexuality)  These side effects are less common side effects (occurring in about 10-29%) of patients receiving tamoxifen:  Nausea   Menstrual irregularities   Vaginal bleeding  Weight loss   Mood changes (see anxiety and/or depression)  A rare, but serious side effect of tamoxifen is blood clots, including deep vein thrombosis (DVT) and pulmonary embolus. You should seek emergency help and notify your health care provider immediately if you develop sudden chest pain and shortness of breath. Notify your health care  provider within 24 hours if you notice that one leg is swollen, red, painful and/or warm to touch and the other is not.  A rare, but serious side effect of tamoxifen can be the development of uterine cancer. Women who have not had a hysterectomy should have regular pap smears and gyn examinations. Abnormal vaginal bleeding should be reported to your health care provider.   Your fertility, meaning your ability to conceive or father a child, may be affected by tamoxifen. Please discuss this issue with your health care provider.  Not all side effects are listed above. Some that are rare (occurring in less than 10% of patients) are not listed here. However, you should always inform your health care provider if you experience any unusual symptoms.  When to contact your doctor or health care provider:  Seek emergency help immediately and notify your health care provider, it you experience the following symptoms:  Sudden shortness of breath and/or chest pain  The following symptoms require medical attention, but are not an emergency. Contact your health care provider within 24 hours of noticing any of the following:  Swelling, redness and/or pain in one leg or arm and not the other   New breast lumps   Excessive vaginal discharge or bleeding, menstrual (period) pain or irregularities   Nausea (interferes with ability to eat and unrelieved with prescribed medication)   Depression (interfering with your ability to carry on your regular activities)   Changes in vision  Always inform your health care provider if you experience any unusual symptoms.  Precautions:   Before starting tamoxifen treatment, make sure you tell your doctor about any other medications you are taking (including prescription, over-the-counter, vitamins, herbal remedies, etc.). Do not take aspirin, or products containing aspirin unless your doctor specifically permits this.   Let your health care professional know if you have ever had a blood clot  that required medical treatment.   Inform your health care professional if you are pregnant or may be pregnant prior to starting this treatment. Pregnancy category D (tamoxifen may be hazardous to the fetus. Women who are pregnant or become pregnant must be advised of the potential hazard to the fetus).   For both men and women: Do not conceive a child (get pregnant) while taking tamoxifen. Barrier methods of contraception, such as condoms, are recommended. Discuss with your doctor when you may safely become pregnant or conceive a child after therapy.   Do not breast feed while taking this medication.  Self-care tips:  Do not stop taking this medication unless your healthcare provider tells you. You may be on it for as long as 5 years.   If you are experiencing hot flashes, wearing light clothing, staying in a cool environment, and putting cool cloths on your head may reduce symptoms. Consult you health care provider if these worsen, or become intolerable   This medication causes little nausea. But if you should experience nausea, take anti-nausea medications as prescribed by your doctor, and eat small frequent meals. Sucking on lozenges and chewing gum may also help.   Avoid sun exposure. Wear SPF 63 (or higher) sunblock and protective clothing.  In general, drinking alcoholic beverages should be kept to a minimum or avoided completely. You should discuss this with your doctor.   Get plenty of rest.   Maintain good nutrition.   If you experience symptoms or side effects, be sure to discuss them with your health care team. They can prescribe medications and/or offer other suggestions that are effective in managing such problems.  Monitoring and testing:   You will be checked regularly by your health care professional while you are taking tamoxifen, to monitor side effects and check your response to therapy. Periodic blood work to monitor your complete blood count (CBC) as  well as the function of other organs (such as your kidneys and liver) may also be ordered by your doctor.   Women will need a gynecologic (GYN) examination before therapy, and during therapy, at regular intervals. Discuss the appropriate schedule with your health care provider.   How this drug works:   Hormones are chemical substances that are produced by glands in the body, which enter the bloodstream and cause effects in other tissues. For example, the hormone testosterone, made in the testicles and is responsible for female characteristics such as deepening voice and increased body hair. The use of hormone therapy to treat cancer is based on the observation that receptors for specific hormones that are needed for cell growth are on the surface of some tumor cells. Hormone therapy can work by stopping the production of a certain hormone, blocking hormone receptors, or substituting chemically similar agents for the active hormone, which cannot be used by the tumor cell. The different types of hormone therapies are categorized by their function and/or the type of hormone that is affected.  Tamoxifen is an antiestrogen. Antiestrogens bind to estrogen receptor site on cancer cells thus blocking estrogen from going into the cancer cell. This interferes with cell growth and eventually leads to cell death. The following are antiestrogen medications.  tamoxifen, toremifene  Note: We strongly encourage you to talk with your health care professional about your specific medical condition and treatments. The information contained in this website is meant to be helpful and educational, but is not a substitute for medical advice.           Follow Up:  As scheduled    Recent Lab Results:  No results found for this or any previous visit (from the past 12 hour(s)).      Treatment Summary has been discussed and given to patient: n/a        --------------------------------------------------------------------------- ----------------------------------------  Please call our office at [phone] if you have any  of the following symptoms:   ?? Fever of 100.5 or greater  ?? Chills  ?? Shortness of breath  ?? Swelling or pain in one leg    After office hours an answering service is available and will contact a provider for emergencies or if you are experiencing any of the above symptoms.    ? Patient does express an interest in My Chart.  My Chart log in information explained on the after visit summary printout at the [facility] desk.    From Nunzio Cory RN,BSN  Nurse Navigator  Cell [phone]  [email or web address]            ASCO ANSWERS is a collection of oncologist-approved patient education materials developed by the American Society of Clinical Oncology (ASCO) for people with cancer and their caregivers.     Breast Cancer    What is breast cancer?   Breast cancer  begins when healthy breast cells change and grow out of control, usually forming a mass called a tumor. Breast cancer is the most common type of cancer diagnosed in women in the [place] (excluding skin cancer).       What are the parts of the breast?   Most of the breast is fatty tissue. However, it also contains a network of lobes that are made up of tiny, tube-like structures called lobules that contain milk glands. Tiny ducts connect the glands, lobules, and lobes, and carry milk from the lobes to the nipple. Most breast cancers begin in the cells lining the milk ducts and are called ductal carcinomas. The second most common type starts in the lobules and is called lobular carcinoma.     What does stage mean?   The stage is a way of describing where the cancer is located, how much the cancer has grown, and if or where it has spread. There are 5 stages for breast cancer: stage 0 (zero), which is called noninvasive cancer or ductal carcinoma in situ (DCIS), and stages I through IV (1 through 4).  Descriptions and illustrations of these stages are available at www.cancer.net/breast.     How is breast cancer treated?   The biology and behavior of a breast cancer affect the treatment plan, and every person???s cancer is different. Doctors consider many factors when recommending a treatment plan, including the cancer???s stage; the tumor???s human epidermal growth factor receptor 2 (HER2) status and the hormone receptor status, which includes estrogen receptors (ER) and progesterone receptors (PR); the presence of known mutations (changes) in breast cancer genes; and the woman???s age, general health, and whether she has experienced menopause. For earlier stages of cancer, surgery to remove the tumor and nearby lymph nodes usually is the first treatment. Additional treatment with chemotherapy, radiation therapy, hormonal therapy, or targeted therapy is usually given after surgery to lower the risk of the cancer returning. These treatments may also be given before surgery to shrink the size of the tumor. The treatment of cancer that has spread or come back after treatment depends on many factors. It can include the therapies listed above used in a different combination or at a different pace. When making treatment decisions, women may also consider a clinical trial; talk with your doctor about all treatment options. The side effects of breast cancer treatment can be reduced or managed with a variety of medications and the help of your health care team. This is called palliative care and is an important part of the overall treatment plan.     How can I cope with breast cancer?   Absorbing the news of a cancer diagnosis and communicating with your health care team are key parts of the coping process. Seeking support, organizing your health information, making sure all of your questions are answered, and participating in the decision-making process are other  steps. Talk with your health care team about any concerns. Understanding your emotions and those of people close to you can be helpful in managing the diagnosis, treatment, and healing process.   [place] ?? 2004 AMERICAN SOCIETY OF CLINICAL ONCOLOGY. MADE AVAILABLE THROUGH   Questions to ask the health care team   Regular communication is important in making informed decisions about your health care. Consider asking the following questions of your health care team:    What type of breast cancer do I have?    Can you explain my pathology report (  laboratory test results) to me?    What stage is the breast cancer? What does this mean?    What is the ER/PR status of the tumor? The HER2 status? What does this                       mean?    Would you explain my treatment options?    What clinical trials are available for me? Where are they located, and how do I                 find out more about them?    What treatment plan do you recommend? Why?    Should treatment before surgery be considered?    What is the goal of each treatment? Is it to eliminate the cancer, help me feel                   better, or both?    Who will be part of my treatment team, and what does each member do?    How will this treatment affect my daily life? Will I be able to work, exercise, and                perform my usual activities?    Will this treatment affect my ability to become pregnant or have children? What                can be done to preserve my fertility?    What long-term side effects are associated with my cancer treatment?    If I???m worried about managing the costs of cancer care, who can help me?    Where can I find emotional support for me and my family?    Whom should I call with questions or problems?    Is there anything else I should be asking?     Find more questions to ask the health care team at [email or web address]  and [email or web address]. For a digital list of questions, download Cancer.Net???s free mobile app at www.cancer.net/app.   The ideas and opinions expressed here do not necessarily reflect the opinions of the American Society of Clinical Oncology (ASCO) or [employer]. The information in this fact sheet is not intended as medical or legal advice, or as a substitute for consultation with a physician or other licensed health care provider. Patients with health care-related questions should call or see their physician or other health care provider promptly and should not disregard professional medical advice, or delay seeking it, because of information encountered here. The mention of any product, service, or treatment in this fact sheet should not be construed as an ASCO endorsement. ASCO is not responsible for any injury or damage to persons or property arising out of or related to any use of ASCO???s patient education materials, or to any errors or omissions.     To order more printed copies, please call [phone] or visit [email or web address]/estore.    TERMS TO KNOW     Benign: A growth that is not cancerous     Biopsy: Removal of a small tissue sample that is examined under a microscope to check for cancer cells     Chemotherapy: The use of drugs to destroy cancer cells     DCIS: Ductal carcinoma in situ; cancer that has not spread past the ducts and is not invasive     Lymph node: A tiny, bean-shaped organ that fights infection  Lumpectomy: The surgical removal of the tumor and an area of healthy tissue around the tumor     Malignant: A cancerous growth or mass     Mastectomy: Surgical removal of the entire breast     Metastasis: The spread of cancer to another part of the body, usually to another organ     Oncologist: A doctor who specializes in treating cancer     Radiation therapy: The use of high-energy x-rays to destroy cancer cells     Tumor: An abnormal growth of body tissue      [address], [place], [place] [address]   Toll Free: [phone]   Phone: [phone] [email or web address]   [email or web address]   [email or web address] ?? 2017 American Society of Clinical Oncology. For permissions information, contact [email or web address].   [email or web address]

## 2018-06-18 NOTE — Progress Notes (Signed)
[facility]: Office Visit New Patient H & P    Chief Complaint:    Chief Complaint   Patient presents with   ??? New Patient         History of Present Illness:  Jill Kaufman is a 53 y.o. female who presents today for evaluation regarding breast cancer.  She underwent routine mammogram in September 2019 which showed an irregular mass density in the upper outer right breast.  She underwent ultrasound and biopsy which showed invasive ductal carcinoma, grade 2, ER 95%, PR 95%, HER2 negative.  MRI showed the lesion to be 1.5 cm with no other abnormalities.  She was referred to Dr. Domenica Fail and underwent lumpectomy and sentinel node biopsy on 05/04/18 which showed 1.6 cm of tumor with negative margins and 1 sentinel lymph node negative for tumor.  She now presents to medical oncology for recommendations.  No complaints, recovered completely from surgery.          Review of Systems:  Constitutional: Negative.   HENT: Negative.   Eyes: Negative.   Respiratory: Negative.   Cardiovascular: Negative.   Gastrointestinal: Negative.   Genitourinary: Negative.   Musculoskeletal: Negative.   Skin: Negative.   Neurological: Negative.   Endo/Heme/Allergies: Negative.   Psychiatric/Behavioral: Negative.   All other systems reviewed and are negative.     Allergies   Allergen Reactions   ??? Codeine Vertigo     Past Medical History:   Diagnosis Date   ??? Breast cancer ([place])     T1cN0 right breast cancer   ??? GERD (gastroesophageal reflux disease)     OTC meds as needed    ??? High cholesterol    ??? Nausea & vomiting    ??? Thyroid disease 06/14/2011    Hypothyroidism      Past Surgical History:   Procedure Laterality Date   ??? HX BREAST BIOPSY Right 05/04/2018    RIGHT BREAST NEEDLE LOCALIZED BIOPSY performed by Lorenda Cahill, MD at [place]   ??? HX BREAST LUMPECTOMY Right 05/04/2018    RIGHT BREAST LUMPECTOMY performed by Lorenda Cahill, MD at [place]   ??? HX GYN      hysterectomy     Family History    Problem Relation Age of Onset   ??? Heart Disease Mother    ??? Cancer Father         eshophageal cancer, skin cancer    ??? Diabetes Father    ??? Heart Disease Father    ??? Stroke Father      Social History     Socioeconomic History   ??? Marital status: MARRIED     Spouse name: Not on file   ??? Number of children: Not on file   ??? Years of education: Not on file   ??? Highest education level: Not on file   Occupational History   ??? Not on file   Social Needs   ??? Financial resource strain: Not on file   ??? Food insecurity:     Worry: Not on file     Inability: Not on file   ??? Transportation needs:     Medical: Not on file     Non-medical: Not on file   Tobacco Use   ??? Smoking status: Never Smoker   ??? Smokeless tobacco: Never Used   Substance and Sexual Activity   ??? Alcohol use: Yes     Comment: occas   ??? Drug use: Not  Currently   ??? Sexual activity: Not on file   Lifestyle   ??? Physical activity:     Days per week: Not on file     Minutes per session: Not on file   ??? Stress: Not on file   Relationships   ??? Social connections:     Talks on phone: Not on file     Gets together: Not on file     Attends religious service: Not on file     Active member of club or organization: Not on file     Attends meetings of clubs or organizations: Not on file     Relationship status: Not on file   ??? Intimate partner violence:     Fear of current or ex partner: Not on file     Emotionally abused: Not on file     Physically abused: Not on file     Forced sexual activity: Not on file   Other Topics Concern   ??? Not on file   Social History Narrative   ??? Not on file     Current Outpatient Medications   Medication Sig Dispense Refill   ??? pitavastatin calcium (LIVALO PO) Take 1 mg by mouth nightly.     ??? levothyroxine (SYNTHROID) 75 mcg tablet Take 75 mcg by mouth Daily (before breakfast).         OBJECTIVE:  Visit Vitals  BP (!) 150/95 Comment: standing   Pulse 81   Temp 98.9 ??F (37.2 ??C) (Oral)   Resp 18   Ht 5' 3.5" (1.613 m) Comment: taken w/o shoes    Wt 148 lb (67.1 kg)   SpO2 97%   BMI 25.81 kg/m??       Physical Exam:  Constitutional: Well developed, well nourished female in no acute distress, sitting comfortably on the examination table.    HEENT: Normocephalic and atraumatic. Oropharynx is clear, mucous membranes are moist.  Sclerae anicteric. Neck supple without JVD. No thyromegaly present.    Lymph node   No palpable submandibular, cervical, supraclavicular lymph nodes.   Skin Warm and dry.  No bruising and no rash noted.  No erythema.  No pallor.    Respiratory Lungs are clear to auscultation bilaterally without wheezes, rales or rhonchi, normal air exchange without accessory muscle use.    CVS Normal rate, regular rhythm and normal S1 and S2.  No murmurs, gallops, or rubs.   Abdomen Soft, nontender and nondistended, normoactive bowel sounds.  No palpable mass.  No hepatosplenomegaly.   Neuro Grossly nonfocal with no obvious sensory or motor deficits.   MSK Normal range of motion in general.  No edema and no tenderness.   Psych Appropriate mood and affect.      Labs:  No results found for this or any previous visit (from the past 24 hour(s)).    Imaging:  MRI of the Breasts with and without contrast  ??  CLINICAL INDICATION:  New diagnosis of right 9:00 breast cancer status post  biopsy on 10/1/2019at [facility] demonstrating invasive ductal  carcinoma grade 2. Evaluate for extent of disease, staging, therapy planning. No  reported personal malignancy or breast surgery otherwise, and no family breast  or ovarian cancer.  ??  COMPARISON: Outside mammography and ultrasound from [facility]  03/13/2018, 03/07/2018, 02/27/2018  ??  TECHNIQUE: Standard MRI sequences were obtained through the breasts in multiple  planes. Images were obtained before and after intravenous infusion of 13 mL of  Dotarem contrast.  Images were reviewed with PACS and with Dynacad CAD software.  ??   FINDINGS: The breasts demonstrate moderate bilateral glandularity, and moderate  bilateral background enhancement which could limit accuracy. There are  innumerable tiny enhancing foci and a few scattered small cysts on each side.  ??  Right lateral 9:00 biopsy site demonstrates a clip within an irregular  heterogeneously enhancing mass measuring up to 1.3 x 1.0 x 1.5 cm.  ??  There is no evidence of suspicious enhancing mass, and no dominant or unique  nonmass enhancement, to suggest additional malignancy in either breast.  There  is no evidence of axillary or internal mammary lymphadenopathy. Chest wall  signal is normal.   ??  ??  IMPRESSION  IMPRESSION:   1. Right 9:00 breast cancer measures up to 1.5 cm.  2. No additional discrete abnormality in either breast. Moderate background  enhancement.  3. No evidence of lymphadenopathy or chest wall abnormalities.  ??  Recommend continued management as directed clinically. Annual bilateral  mammogram will be due in September 2020. This patient would likely benefit from  supplemental breast MRI in one year as well.  ??  BI-RADS Assessment Category 6: Known Biopsy Proven Malignancy  ??  ??  Pathology:            ASSESSMENT:    ICD-10-CM ICD-9-CM    1. Malignant neoplasm of central portion of right breast in female, estrogen receptor positive ([place]) Z30.865 784.6 FOLLICLE STIMULATING HORMONE    Z17.0 V86.0 LUTEINIZING HORMONE      ESTRADIOL      REFERRAL TO RADIATION ONCOLOGY     Problem List  Date Reviewed: 2018/07/10          Codes Class Noted    S/P lumpectomy of breast ICD-10-CM: Z98.890  ICD-9-CM: V45.89  05/17/2018        Malignant neoplasm of central portion of right breast in female, estrogen receptor positive ([place]) ICD-10-CM: C50.111, Z17.0  ICD-9-CM: 174.1, V86.0  04/12/2018                PLAN:  Lab studies and imaging studies were personally reviewed.    Pertinent old records were reviewed.     Case discussed with Dr. Domenica Fail and others at [facility].     Breast cancer: 1.6 cm, grade 2 IDC, sentinel lymph node biopsy negative, ER 9%, PR 95%, HER2 negative.  S/p lumpectomy with negative margins.  She has completed definitive surgery.  Because of the ER/PR positivity, the negative nodal assessment, and the T1 tumor, she would be an excellent candidate for Oncotype Dx for risk stratification, which was low risk at 6.  Therefore, chemotherapy can be safely deferred.  We will refer her for radiation therapy, then begin endocrine therapy.  She has completed hysterectomy but her ovaries are intact, since she is 53 years old we will check her FSH/LH/E2 levels to determine if she is still premenopausal.  If so, we will proceed with tamoxifen for 2-3 years, then transition to AI once she is post-menopausal.  If she is already post-menopausal by serologies, we will give AI instead.  We discussed side effects of tamoxifen including the risk of VTE, as well as vasomotor symptoms and arthralgias.  She understands and agrees to proceed.  All questions were asked and answered to the best of my ability.  F/u in about 3 months for recheck on endocrine therapy.            Greer Ee, MD  [facility] [place] [address]  Office : [phone]  Fax : [phone]

## 2018-07-03 ENCOUNTER — Inpatient Hospital Stay: Admit: 2018-07-03 | Payer: PRIVATE HEALTH INSURANCE | Attending: Radiation Oncology | Primary: Family Medicine

## 2018-07-03 DIAGNOSIS — C50411 Malignant neoplasm of upper-outer quadrant of right female breast: Secondary | ICD-10-CM

## 2018-07-03 NOTE — Care Coordination-Inpatient (Signed)
Consult right breast cancer.  Lumpectomy 05-04-18.  Oncotype 6. No chemo.  F/u Dr. Jenetta Loges 08-07-18.  Plan RT then Tamoxifen or AI.  Pt is Gravida 2/ para 2.  Started menses at 12-13 yoa.  Menses ended with partial hysterectomy.  History of oral contraceptive use.  No history of HRT.  CONSENTS SIGNED FOR RT TO THE RIGHT BREAST.  CT SIM SCHEDULED 07-06-18 AT 2 PM.  APT GIVEN TO PT.     Alain Honey, RN

## 2018-07-03 NOTE — Consults (Signed)
Patient: Jill Kaufman MRN: [id number]  SSN: [id number]    Date of Birth: [birth year]  Age: 53 y.o.  Sex: female      Other Providers:   Smith Robert, MD     Con Memos, MD    CHIEF COMPLAINT: Breast cancer    DIAGNOSIS: invasive ductal carcinoma of the right breast, ER/PR positive, HER-2 negative, grade 2, pT1c pN0, Stage IA    HISTORY OF PRESENT ILLNESS:  Jill Kaufman is a 53 y.o. female who I am seeing at the request of Dr. Domenica Fail.  She has a medical history significant for hypercholesterolemia, hypothyroidism,  and GERD. She underwent routine screening mammogram in 09/19 that showed an irregular mass density in the upper outer right breast.  She underwent ultrasound and biopsy that showed invasive ductal carcinoma, grade 2, ER 95%, PR 95%, HER-2 negative.  MRI of the breasts showed the lesion to measure 1.5 cm with no other abnormalities.   She was seen by Dr. Domenica Fail and underwent lumpectomy and sentinel lymph node biopsy on 05/04/18.  Pathology demonstrated a 1.6 cm tumor with negative margins and 0/1 SLN involved. She was then seen by Dr. Jenetta Loges for discussion of systemic therapy. She had an Oncotype Dx score of 6 (low).  She was not recommended to undergo chemotherapy, but was recommended to consider endocrine therapy.  Specifically, given her perimenopausal state, she is recommended to undergo 2 years of tamoxifen followed by an AI. She is here today to discuss the role of radiation therapy in further management of her Stage IA right breast cancer.    At this time, Ms. Zywicki is doing very well. She has recovered nicely from surgery. She has no systemic complaints.       OBSTETRIC HISTORY:    Menarche at the age of 61-13.    G2,P2.    Oral Contraceptive Pills:  None  Hormone Replacement Therapy:  None    PAST MEDICAL HISTORY:    Past Medical History:   Diagnosis Date   ??? Breast cancer ([place])     T1cN0 right breast cancer   ??? GERD (gastroesophageal reflux disease)     OTC meds as needed    ??? High cholesterol     ??? Nausea & vomiting    ??? Thyroid disease 06/14/2011    Hypothyroidism        The patient denies history of collagen vascular diseases, pacemaker insertion, prior radiation or prior chemotherapy.     PAST SURGICAL HISTORY:   Past Surgical History:   Procedure Laterality Date   ??? HX BREAST BIOPSY Right 05/04/2018    RIGHT BREAST NEEDLE LOCALIZED BIOPSY performed by Lorenda Cahill, MD at [place]   ??? HX BREAST LUMPECTOMY Right 05/04/2018    RIGHT BREAST LUMPECTOMY performed by Lorenda Cahill, MD at [place]   ??? HX GYN      hysterectomy       MEDICATIONS:     Current Outpatient Medications:   ???  cholecalciferol (VITAMIN D3) (1000 Units /25 mcg) tablet, Take  by mouth daily., Disp: , Rfl:   ???  pitavastatin calcium (LIVALO PO), Take 1 mg by mouth nightly., Disp: , Rfl:   ???  levothyroxine (SYNTHROID) 75 mcg tablet, Take 75 mcg by mouth Daily (before breakfast)., Disp: , Rfl:     ALLERGIES:   Allergies   Allergen Reactions   ??? Codeine Vertigo       SOCIAL HISTORY:   Social History  Socioeconomic History   ??? Marital status: MARRIED     Spouse name: Not on file   ??? Number of children: Not on file   ??? Years of education: Not on file   ??? Highest education level: Not on file   Occupational History   ??? Not on file   Social Needs   ??? Financial resource strain: Not on file   ??? Food insecurity:     Worry: Not on file     Inability: Not on file   ??? Transportation needs:     Medical: Not on file     Non-medical: Not on file   Tobacco Use   ??? Smoking status: Never Smoker   ??? Smokeless tobacco: Never Used   Substance and Sexual Activity   ??? Alcohol use: Yes     Comment: occas   ??? Drug use: Not Currently   ??? Sexual activity: Not on file   Lifestyle   ??? Physical activity:     Days per week: Not on file     Minutes per session: Not on file   ??? Stress: Not on file   Relationships   ??? Social connections:     Talks on phone: Not on file     Gets together: Not on file     Attends religious service: Not on file     Active  member of club or organization: Not on file     Attends meetings of clubs or organizations: Not on file     Relationship status: Not on file   ??? Intimate partner violence:     Fear of current or ex partner: Not on file     Emotionally abused: Not on file     Physically abused: Not on file     Forced sexual activity: Not on file   Other Topics Concern   ??? Military Service Not Asked   ??? Blood Transfusions Not Asked   ??? Caffeine Concern Not Asked   ??? Occupational Exposure Not Asked   ??? Hobby Hazards Not Asked   ??? Sleep Concern Not Asked   ??? Stress Concern Not Asked   ??? Weight Concern Not Asked   ??? Special Diet Not Asked   ??? Back Care Not Asked   ??? Exercise Not Asked   ??? Bike Helmet Not Asked   ??? Seat Belt Not Asked   ??? Self-Exams Not Asked   Social History Narrative   ??? Not on file       FAMILY HISTORY:   Family History   Problem Relation Age of Onset   ??? Heart Disease Mother    ??? Cancer Father         eshophageal cancer, skin cancer    ??? Diabetes Father    ??? Heart Disease Father    ??? Stroke Father        REVIEW OF SYSTEMS: Please see the completed review of systems sheet in the chart that I have reviewed today.      PHYSICAL EXAMINATION:   ECOG Performance status 0  VITAL SIGNS:   Visit Vitals  BP 136/87 (BP 1 Location: Left arm, BP Patient Position: Sitting)   Pulse 80   Temp 97.9 ??F (36.6 ??C)   Wt 66.9 kg (147 lb 6.4 oz)   SpO2 99%   BMI 25.70 kg/m??        GENERAL: The patient is well-developed, ambulatory, alert and in no acute distress. HEENT: Head is normocephalic, atraumatic.  Pupils are equal, round and reactive to light and accommodation. Extraocular movement intact. NECK: Neck is supple with no masses. CARDIOVASCULAR: Heart has regular rate and rhythm. There are no murmurs, rubs or gallops. Radial pulses are 2+ RESPIRATORY: Lungs are clear to auscultation and percussion. There is normal respiratory effort. GASTROINTESTINAL: The abdomen is soft, non-tender, nondistended with no hepatospelnomagaly. LYMPHATIC:  There is no cervical, supraclavicular or axillary lymphadenopathy bilaterally. MUSCULOSKELETAL: Extremities reveal no cyanosis, clubbing or edema. Grip is 5+/5.  BREASTS: Examination of the unaffected breast reveals no dominant nodules or masses.  The lumpectomy cavity appears well healed with good aesthetics and symmetry.  Well healed surgical incision. NEURO:  Cranial nerves II-XII grossly intact.  Muscular strength and sensation are intact throughout all four extremities.        PATHOLOGY:    05/04/18:    DIAGNOSIS   A: ???RIGHT BREAST???: IN SITU AND INFILTRATING DUCTAL CARCINOMA, INTERMEDIATE GRADE. NO TUMOR IS IDENTIFIED AT INKED MARGINS OF RESECTION. SEE CAP SYNOPTIC REPORT BELOW.   PROCEDURE: Excision, needle localization lumpectomy.   SPECIMEN LATERALITY: Right.   TUMOR SIZE: 1.6 centimeters.   HISTOLOGIC TYPE: Infiltrating ductal carcinoma.   HISTOLOGIC GRADE: Intermediate grade.   GLANDULAR (ACINAR/TUBULAR DIFFERENTIATION): Score 2.   NUCLEAR PLEOMORPHISM: Score 2.   MITOTIC RATE: Score 2.   OVERALL GRADE: Grade 2 (scores of 6 or 7).   TUMOR FOCALITY: Single focus of invasive carcinoma.   DUCTAL CARCINOMA IN SITU (DCIS): Present.   ARCHITECTURAL PATTERNS OF DCIS: Cribriform.   HISTOLOGIC GRADE OF DCIS: Grade 2 (intermediate grade).   NECROSIS ASSOCIATED WITH DCIS: Not identified.   MARGINS: No carcinoma identified at inked margins of resection.   CLOSEST SPECIMEN MARGIN OF RESECTION: Superior margin (2 millimeters).   See additionally submitted superior margin in part C.   DCIS MARGINS: No DCIS identified at margins of resection   MICROCALCIFICATIONS: Present.   REGIONAL LYMPH NODES (SEE ALSO PART E):   NUMBER OF LYMPH NODES EXAMINED: 1   NUMBER OF LYMPH NODES INVOLVED BY METASTATIC CARCINOMA: 0   LYMPHOVASCULAR INVASION: Not identified.   PATHOLOGIC STAGE CLASSIFICATION: pT1c N0 (sn) (ACJJ, 8TH EDITION, 2017).   BREAST BIOMARKER TESTING: Previously performed per [facility] (Estrogen receptor positive,  progesterone receptor positive, Her-2/neu negative. Previous pathology report not available ).   B: ???RIGHT BREAST DEEP MEDIAL MARGIN???: NO CARCINOMA IDENTIFIED.   C: ???RIGHT BREAST SUPERIOR MARGIN???: NO CARCINOMA IDENTIFIED.   D: ???RIGHT BREAST INFERIOR MARGIN???: NO CARCINOMA IDENTIFIED.   E: ???RIGHT SENTINEL NODE BIOPSY???: LYMPH NODE TISSUE, NO CARCINOMA IDENTIFIED. SEE SUMMARY STAGING ABOVE.     Procedures/Addenda   STF - ONCOTYPE DX ASSAY   Status: Signed Out Noel A. Ennis Forts, M.D., Ph.D. on 05/25/2018   Interpretation   [facility] Oncotype DX Breast Recurrence Score?? Result: 6.       LABORATORY: No results found for: NA, K, CL, CO2, AGAP, GLU, BUN, CREA, GFRAA, GFRNA, CA, MG, PHOS, ALB, TBIL, TP, ALB, GLOB, AGRAT, SGOT, ALT, GPT  No results found for: WBC, HGB, HCT, PLT, HGBEXT, HCTEXT, PLTEXT, HGBEXT, HCTEXT, PLTEXT    RADIOLOGY:      03/26/18: MRI of the Breasts with and without contrast  ??  CLINICAL INDICATION:  New diagnosis of right 9:00 breast cancer status post  biopsy on 10/1/2019at [facility] demonstrating invasive ductal  carcinoma grade 2. Evaluate for extent of disease, staging, therapy planning. No  reported personal malignancy or breast surgery otherwise, and no family breast  or ovarian  cancer.  ??  COMPARISON: Outside mammography and ultrasound from [facility]  03/13/2018, 03/07/2018, 02/27/2018  ??  TECHNIQUE: Standard MRI sequences were obtained through the breasts in multiple  planes. Images were obtained before and after intravenous infusion of 13 mL of  Dotarem contrast. Images were reviewed with PACS and with Dynacad CAD software.  ??  FINDINGS: The breasts demonstrate moderate bilateral glandularity, and moderate  bilateral background enhancement which could limit accuracy. There are  innumerable tiny enhancing foci and a few scattered small cysts on each side.  ??  Right lateral 9:00 biopsy site demonstrates a clip within an irregular  heterogeneously enhancing mass  measuring up to 1.3 x 1.0 x 1.5 cm.  ??  There is no evidence of suspicious enhancing mass, and no dominant or unique  nonmass enhancement, to suggest additional malignancy in either breast.  There  is no evidence of axillary or internal mammary lymphadenopathy. Chest wall  signal is normal.   ??  ??  IMPRESSION  IMPRESSION:   1. Right 9:00 breast cancer measures up to 1.5 cm.  2. No additional discrete abnormality in either breast. Moderate background  enhancement.  3. No evidence of lymphadenopathy or chest wall abnormalities.  ??  Recommend continued management as directed clinically. Annual bilateral  mammogram will be due in September 2020. This patient would likely benefit from  supplemental breast MRI in one year as well.  ??  BI-RADS Assessment Category 6: Known Biopsy Proven Malignancy  ??  ??      IMPRESSION:  Girtrude Enslin is a 53 y.o. female with invasive ductal carcinoma of the right breast, ER/PR positive, HER-2 negative, grade 2, pT1c pN0, Stage IA      COUNSELING AND COORDINATION OF CARE: I have had a 60 minute consultation with Ms. Lawniczak of which greater than half has been spent counseling her about management options for her Stage IA right breast cancer. The natural history of breast cancer was reviewed with the patient.   Prognostic features including stage, performance status and presence or absence of lymph node involvement were reviewed with the patient.   Various treatment options including lumpectomy alone, breast conservation therapy, and mastectomy were compared and contrasted with regard to outcome and quality of life.  We discussed the data in support of breast conservation therapy, specifically NSABP B-06, which compared mastectomy to lumpectomy plus or minus radiation.  This showed no difference in overall survival but improved local regional control with adjuvant radiation.  This has become the standard for patients wishing to conserve the breast.  Subsequent trials have evaluated the role of  boost following an initial course and again showed improved control.  We have, therefore, typically recommended 50 Gy to the breast followed by a 10 Gy boost to the lumpectomy cavity.      There is now also long-term data in support of hypofractionation which has come out of [place] and the [place] where 3-4 weeks of radiation was compared to the conventional treatment and to date has shown equivalent tumor control and cosmetic outcomes.  This is an option for women who were not excluded from the studies or found on subgroup analysis to suffer worse outcomes: women with Grade III tumors, generally women receiving chemotherapy, with large breast size such that significant heterogeneity can not be avoided, and lymph node node positive disease.  ASTRO consensus guidelines now support these conclusions.  Based on her disease characteristics and anatomy, Ms. Coto seems to be a good candidate  for a hypofractionated radiation course.     The practicalities, indications, benefits, side-effects and complications of radiation therapy were discussed.   Skin changes fatigue, and potential for long-term complications including radiation pneumonitis, and rib fractures were among the complications highlighted.     I have recommended a course of radiation therapy to the breast as a standard portion of breast conservation therapy.  The patient asked numerous questions, which were answered to their satisfaction and written informed consent for radiation therapy was obtained.    Plan:  1. Genetic testing-not indicated  2. Smoking cessation-not indicated  3. Patient will be simulated, with radiotherapy to begin shortly thereafter.  A dose of 42.56 Gy to the whole breast followed by a 10 Gy boost to the tumor bed will be prescribed.       I appreciate the opportunity to participate in Ms. Agyeman's care.    Milta Deiters, MD   July 03, 2018

## 2018-07-03 NOTE — Consults (Signed)
Patient: Jill Kaufman MRN: [id number]  SSN: [id number]    Date of Birth: February 26, 1966  Age: 53 y.o.  Sex: female      Other Providers:   Smith Robert, MD     Con Memos, MD    CHIEF COMPLAINT: Breast cancer    DIAGNOSIS: invasive ductal carcinoma of the right breast, ER/PR positive, HER-2 negative, grade 2, pT1c pN0, Stage IA    HISTORY OF PRESENT ILLNESS:  Jill Kaufman is a 53 y.o. female who I am seeing at the request of Dr. Domenica Fail.  She has a medical history significant for hypercholesterolemia, hypothyroidism,  and GERD. She underwent routine screening mammogram in 09/19 that showed an irregular mass density in the upper outer right breast.  She underwent ultrasound and biopsy that showed invasive ductal carcinoma, grade 2, ER 95%, PR 95%, HER-2 negative.  MRI of the breasts showed the lesion to measure 1.5 cm with no other abnormalities.   She was seen by Dr. Domenica Fail and underwent lumpectomy and sentinel lymph node biopsy on 05/04/18.  Pathology demonstrated a 1.6 cm tumor with negative margins and 0/1 SLN involved. She was then seen by Dr. Jenetta Loges for discussion of systemic therapy. She had an Oncotype Dx score of 6 (low).  She was not recommended to undergo chemotherapy, but was recommended to consider endocrine therapy.  Specifically, given her perimenopausal state, she is recommended to undergo 2 years of tamoxifen followed by an AI. She is here today to discuss the role of radiation therapy in further management of her Stage IA right breast cancer.    At this time, [name] is doing very well. She has recovered nicely from surgery. She has no systemic complaints.       OBSTETRIC HISTORY:    Menarche at the age of 4-13.    G2,P2.    Oral Contraceptive Pills:  None  Hormone Replacement Therapy:  None    PAST MEDICAL HISTORY:    Past Medical History:   Diagnosis Date   ??? Breast cancer ([place])     T1cN0 right breast cancer   ??? GERD (gastroesophageal reflux disease)     OTC meds as needed     ??? High cholesterol    ??? Nausea & vomiting    ??? Thyroid disease 06/14/2011    Hypothyroidism        The patient denies history of collagen vascular diseases, pacemaker insertion, prior radiation or prior chemotherapy.     PAST SURGICAL HISTORY:   Past Surgical History:   Procedure Laterality Date   ??? HX BREAST BIOPSY Right 05/04/2018    RIGHT BREAST NEEDLE LOCALIZED BIOPSY performed by Lorenda Cahill, MD at [place]   ??? HX BREAST LUMPECTOMY Right 05/04/2018    RIGHT BREAST LUMPECTOMY performed by Lorenda Cahill, MD at [place]   ??? HX GYN      hysterectomy       MEDICATIONS:     Current Outpatient Medications:   ???  cholecalciferol (VITAMIN D3) (1000 Units /25 mcg) tablet, Take  by mouth daily., Disp: , Rfl:   ???  pitavastatin calcium (LIVALO PO), Take 1 mg by mouth nightly., Disp: , Rfl:   ???  levothyroxine (SYNTHROID) 75 mcg tablet, Take 75 mcg by mouth Daily (before breakfast)., Disp: , Rfl:     ALLERGIES:   Allergies   Allergen Reactions   ??? Codeine Vertigo       SOCIAL HISTORY:   Social History  Socioeconomic History   ??? Marital status: MARRIED     Spouse name: Not on file   ??? Number of children: Not on file   ??? Years of education: Not on file   ??? Highest education level: Not on file   Occupational History   ??? Not on file   Social Needs   ??? Financial resource strain: Not on file   ??? Food insecurity:     Worry: Not on file     Inability: Not on file   ??? Transportation needs:     Medical: Not on file     Non-medical: Not on file   Tobacco Use   ??? Smoking status: Never Smoker   ??? Smokeless tobacco: Never Used   Substance and Sexual Activity   ??? Alcohol use: Yes     Comment: occas   ??? Drug use: Not Currently   ??? Sexual activity: Not on file   Lifestyle   ??? Physical activity:     Days per week: Not on file     Minutes per session: Not on file   ??? Stress: Not on file   Relationships   ??? Social connections:     Talks on phone: Not on file     Gets together: Not on file      Attends religious service: Not on file     Active member of club or organization: Not on file     Attends meetings of clubs or organizations: Not on file     Relationship status: Not on file   ??? Intimate partner violence:     Fear of current or ex partner: Not on file     Emotionally abused: Not on file     Physically abused: Not on file     Forced sexual activity: Not on file   Other Topics Concern   ??? Military Service Not Asked   ??? Blood Transfusions Not Asked   ??? Caffeine Concern Not Asked   ??? Occupational Exposure Not Asked   ??? Hobby Hazards Not Asked   ??? Sleep Concern Not Asked   ??? Stress Concern Not Asked   ??? Weight Concern Not Asked   ??? Special Diet Not Asked   ??? Back Care Not Asked   ??? Exercise Not Asked   ??? Bike Helmet Not Asked   ??? Seat Belt Not Asked   ??? Self-Exams Not Asked   Social History Narrative   ??? Not on file       FAMILY HISTORY:   Family History   Problem Relation Age of Onset   ??? Heart Disease Mother    ??? Cancer Father         eshophageal cancer, skin cancer    ??? Diabetes Father    ??? Heart Disease Father    ??? Stroke Father        REVIEW OF SYSTEMS: Please see the completed review of systems sheet in the chart that I have reviewed today.      PHYSICAL EXAMINATION:   ECOG Performance status 0  VITAL SIGNS:   Visit Vitals  BP 136/87 (BP 1 Location: Left arm, BP Patient Position: Sitting)   Pulse 80   Temp 97.9 ??F (36.6 ??C)   Wt 66.9 kg (147 lb 6.4 oz)   SpO2 99%   BMI 25.70 kg/m??        GENERAL: The patient is well-developed, ambulatory, alert and in no acute distress. HEENT: Head is normocephalic, atraumatic.  Pupils are equal, round and reactive to light and accommodation. Extraocular movement intact. NECK: Neck is supple with no masses. CARDIOVASCULAR: Heart has regular rate and rhythm. There are no murmurs, rubs or gallops. Radial pulses are 2+ RESPIRATORY: Lungs are clear to auscultation and percussion. There is normal respiratory effort. GASTROINTESTINAL: The abdomen is soft,  non-tender, nondistended with no hepatospelnomagaly. LYMPHATIC: There is no cervical, supraclavicular or axillary lymphadenopathy bilaterally. MUSCULOSKELETAL: Extremities reveal no cyanosis, clubbing or edema. Grip is 5+/5.  BREASTS: Examination of the unaffected breast reveals no dominant nodules or masses.  The lumpectomy cavity appears well healed with good aesthetics and symmetry.  Well healed surgical incision. NEURO:  Cranial nerves II-XII grossly intact.  Muscular strength and sensation are intact throughout all four extremities.        PATHOLOGY:    05/04/18:    DIAGNOSIS   A: ???RIGHT BREAST???: IN SITU AND INFILTRATING DUCTAL CARCINOMA, INTERMEDIATE GRADE. NO TUMOR IS IDENTIFIED AT INKED MARGINS OF RESECTION. SEE CAP SYNOPTIC REPORT BELOW.   PROCEDURE: Excision, needle localization lumpectomy.   SPECIMEN LATERALITY: Right.   TUMOR SIZE: 1.6 centimeters.   HISTOLOGIC TYPE: Infiltrating ductal carcinoma.   HISTOLOGIC GRADE: Intermediate grade.   GLANDULAR (ACINAR/TUBULAR DIFFERENTIATION): Score 2.   NUCLEAR PLEOMORPHISM: Score 2.   MITOTIC RATE: Score 2.   OVERALL GRADE: Grade 2 (scores of 6 or 7).   TUMOR FOCALITY: Single focus of invasive carcinoma.   DUCTAL CARCINOMA IN SITU (DCIS): Present.   ARCHITECTURAL PATTERNS OF DCIS: Cribriform.   HISTOLOGIC GRADE OF DCIS: Grade 2 (intermediate grade).   NECROSIS ASSOCIATED WITH DCIS: Not identified.   MARGINS: No carcinoma identified at inked margins of resection.   CLOSEST SPECIMEN MARGIN OF RESECTION: Superior margin (2 millimeters).   See additionally submitted superior margin in part C.   DCIS MARGINS: No DCIS identified at margins of resection   MICROCALCIFICATIONS: Present.   REGIONAL LYMPH NODES (SEE ALSO PART E):   NUMBER OF LYMPH NODES EXAMINED: 1   NUMBER OF LYMPH NODES INVOLVED BY METASTATIC CARCINOMA: 0   LYMPHOVASCULAR INVASION: Not identified.   PATHOLOGIC STAGE CLASSIFICATION: pT1c N0 (sn) ([facility], 8TH EDITION, 2017).    BREAST BIOMARKER TESTING: Previously performed per [facility] (Estrogen receptor positive, progesterone receptor positive, Her-2/neu negative. Previous pathology report not available ).   B: ???RIGHT BREAST DEEP MEDIAL MARGIN???: NO CARCINOMA IDENTIFIED.   C: ???RIGHT BREAST SUPERIOR MARGIN???: NO CARCINOMA IDENTIFIED.   D: ???RIGHT BREAST INFERIOR MARGIN???: NO CARCINOMA IDENTIFIED.   E: ???RIGHT SENTINEL NODE BIOPSY???: LYMPH NODE TISSUE, NO CARCINOMA IDENTIFIED. SEE SUMMARY STAGING ABOVE.     Procedures/Addenda   STF - ONCOTYPE DX ASSAY   Status: Signed Out Noel A. Ennis Forts, M.D., Ph.D. on 05/25/2018   Interpretation   [facility] Oncotype DX Breast Recurrence Score?? Result: 6.       LABORATORY: No results found for: NA, K, CL, CO2, AGAP, GLU, BUN, CREA, GFRAA, GFRNA, CA, MG, PHOS, ALB, TBIL, TP, ALB, GLOB, AGRAT, SGOT, ALT, GPT  No results found for: WBC, HGB, HCT, PLT, HGBEXT, HCTEXT, PLTEXT, HGBEXT, HCTEXT, PLTEXT    RADIOLOGY:      03/26/18: MRI of the Breasts with and without contrast  ??  CLINICAL INDICATION:  New diagnosis of right 9:00 breast cancer status post  biopsy on 10/1/2019at [facility] demonstrating invasive ductal  carcinoma grade 2. Evaluate for extent of disease, staging, therapy planning. No  reported personal malignancy or breast surgery otherwise, and no family breast  or ovarian  cancer.  ??  COMPARISON: Outside mammography and ultrasound from [facility]  03/13/2018, 03/07/2018, 02/27/2018  ??  TECHNIQUE: Standard MRI sequences were obtained through the breasts in multiple  planes. Images were obtained before and after intravenous infusion of 13 mL of  Dotarem contrast. Images were reviewed with PACS and with Dynacad CAD software.  ??  FINDINGS: The breasts demonstrate moderate bilateral glandularity, and moderate  bilateral background enhancement which could limit accuracy. There are  innumerable tiny enhancing foci and a few scattered small cysts on each side.  ??   Right lateral 9:00 biopsy site demonstrates a clip within an irregular  heterogeneously enhancing mass measuring up to 1.3 x 1.0 x 1.5 cm.  ??  There is no evidence of suspicious enhancing mass, and no dominant or unique  nonmass enhancement, to suggest additional malignancy in either breast.  There  is no evidence of axillary or internal mammary lymphadenopathy. Chest wall  signal is normal.   ??  ??  IMPRESSION  IMPRESSION:   1. Right 9:00 breast cancer measures up to 1.5 cm.  2. No additional discrete abnormality in either breast. Moderate background  enhancement.  3. No evidence of lymphadenopathy or chest wall abnormalities.  ??  Recommend continued management as directed clinically. Annual bilateral  mammogram will be due in September 2020. This patient would likely benefit from  supplemental breast MRI in one year as well.  ??  BI-RADS Assessment Category 6: Known Biopsy Proven Malignancy  ??  ??      IMPRESSION:  Misk Galentine is a 53 y.o. female with invasive ductal carcinoma of the right breast, ER/PR positive, HER-2 negative, grade 2, pT1c pN0, Stage IA      COUNSELING AND COORDINATION OF CARE: I have had a 60 minute consultation with Ms. Meditz of which greater than half has been spent counseling her about management options for her Stage IA right breast cancer. The natural history of breast cancer was reviewed with the patient.   Prognostic features including stage, performance status and presence or absence of lymph node involvement were reviewed with the patient.   Various treatment options including lumpectomy alone, breast conservation therapy, and mastectomy were compared and contrasted with regard to outcome and quality of life.  We discussed the data in support of breast conservation therapy, specifically NSABP B-06, which compared mastectomy to lumpectomy plus or minus radiation.  This showed no difference in overall survival but improved local regional control with adjuvant radiation.  This has become  the standard for patients wishing to conserve the breast.  Subsequent trials have evaluated the role of boost following an initial course and again showed improved control.  We have, therefore, typically recommended 50 Gy to the breast followed by a 10 Gy boost to the lumpectomy cavity.      There is now also long-term data in support of hypofractionation which has come out of [place] and the [place] where 3-4 weeks of radiation was compared to the conventional treatment and to date has shown equivalent tumor control and cosmetic outcomes.  This is an option for women who were not excluded from the studies or found on subgroup analysis to suffer worse outcomes: women with Grade III tumors, generally women receiving chemotherapy, with large breast size such that significant heterogeneity can not be avoided, and lymph node node positive disease.  ASTRO consensus guidelines now support these conclusions.  Based on her disease characteristics and anatomy, Ms. Garrow seems to be a good candidate  for a hypofractionated radiation course.     The practicalities, indications, benefits, side-effects and complications of radiation therapy were discussed.   Skin changes fatigue, and potential for long-term complications including radiation pneumonitis, and rib fractures were among the complications highlighted.     I have recommended a course of radiation therapy to the breast as a standard portion of breast conservation therapy.  The patient asked numerous questions, which were answered to their satisfaction and written informed consent for radiation therapy was obtained.    Plan:  1. Genetic testing-not indicated  2. Smoking cessation-not indicated  3. Patient will be simulated, with radiotherapy to begin shortly thereafter.  A dose of 42.56 Gy to the whole breast followed by a 10 Gy boost to the tumor bed will be prescribed.       I appreciate the opportunity to participate in Ms. Victorian's care.    Milta Deiters, MD    July 03, 2018

## 2018-07-03 NOTE — Nurse Consult (Signed)
Consult right breast cancer.  Lumpectomy 05-04-18.  Oncotype 6. No chemo.  F/u Dr. Jenetta Loges 08-07-18.  Plan RT then Tamoxifen or AI.  Pt is Gravida 2/ para 2.  Started menses at 12-13 yoa.  Menses ended with partial hysterectomy.  History of oral contraceptive use.  No history of HRT.  CONSENTS SIGNED FOR RT TO THE RIGHT BREAST.  CT SIM SCHEDULED 07-06-18 AT 2 PM.  APT GIVEN TO PT.     Alain Honey, RN

## 2018-07-06 ENCOUNTER — Inpatient Hospital Stay: Admit: 2018-07-06 | Payer: PRIVATE HEALTH INSURANCE | Primary: Family Medicine

## 2018-07-06 ENCOUNTER — Encounter: Primary: Family Medicine

## 2018-07-06 DIAGNOSIS — Z510 Encounter for antineoplastic radiation therapy: Secondary | ICD-10-CM

## 2018-07-10 ENCOUNTER — Inpatient Hospital Stay: Admit: 2018-07-10 | Payer: PRIVATE HEALTH INSURANCE | Primary: Family Medicine

## 2018-07-18 ENCOUNTER — Ambulatory Visit: Attending: Family Medicine | Primary: Family Medicine

## 2018-07-18 ENCOUNTER — Inpatient Hospital Stay: Admit: 2018-07-18 | Payer: PRIVATE HEALTH INSURANCE | Primary: Family Medicine

## 2018-07-18 ENCOUNTER — Ambulatory Visit
Admit: 2018-07-18 | Discharge: 2018-07-18 | Payer: PRIVATE HEALTH INSURANCE | Attending: Family Medicine | Primary: Family Medicine

## 2018-07-18 DIAGNOSIS — E7800 Pure hypercholesterolemia, unspecified: Secondary | ICD-10-CM

## 2018-07-18 DIAGNOSIS — Z510 Encounter for antineoplastic radiation therapy: Secondary | ICD-10-CM

## 2018-07-18 LAB — AMB POC COMPLETE CBC,AUTOMATED ENTER
ABS. GRANS (POC): 5 10*3/uL (ref 2.0–7.8)
ABS. LYMPHS (POC): 3.1 10*3/uL (ref 0.6–4.1)
GRANULOCYTES (POC): 56.7 % (ref 37.0–92.0)
Granulocytes %, POC: 56.7 % (ref 37.0–92.0)
Granulocytes Abs: 5 10*3/uL (ref 2.0–7.8)
HCT (POC): 41.3 % (ref 37.0–51.0)
HGB (POC): 13.6 g/dL (ref 12.0–18.0)
Hematocrit, POC: 41.3 % (ref 37.0–51.0)
Hemoglobin, POC: 13.6 g/dL (ref 12.0–18.0)
LYMPHOCYTES (POC): 35.5 % (ref 10.0–58.5)
Lymphocyte %: 35.5 % (ref 10.0–58.5)
Lymphs Abs: 3.1 10*3/uL (ref 0.6–4.1)
MCH (POC): 30.1 pg (ref 26.0–32.0)
MCH: 30.1 pg (ref 26.0–32.0)
MCHC (POC): 32.9 g/dL (ref 31.0–36.0)
MCHC: 32.9 g/dL (ref 31.0–36.0)
MCV (POC): 91.3 fL (ref 80.0–97.0)
MCV: 91.3 fL (ref 80.0–97.0)
MID% POC: 7.8 % (ref 0.1–24.0)
MPV (POC): 8 fL (ref 0.0–49.9)
MPV POC: 8 fL (ref 0.0–49.9)
Mid # (POC): 0.7 10*3/uL (ref 0.0–1.8)
Mid Cells %, POC: 7.8 % (ref 0.1–24.0)
Mid Cells Absoulute POC: 0.7 10*3/uL (ref 0.0–1.8)
PLATELET (POC): 336 10*3/uL (ref 140–440)
Platelet Count, POC: 336 10*3/uL (ref 140–440)
RBC (POC): 4.52 10*6/uL (ref 4.20–6.30)
RBC, POC: 4.52 10*6/uL (ref 4.20–6.30)
RDW (POC): 11.9 % (ref 11.5–14.5)
RDW, POC: 11.9 % (ref 11.5–14.5)
WBC (POC): 8.8 10*3/uL (ref 4.1–10.9)
WBC, POC: 8.8 10*3/uL (ref 4.1–10.9)

## 2018-07-18 MED ORDER — PITAVASTATIN 1 MG TAB
1 mg | ORAL_TABLET | Freq: Every evening | ORAL | 3 refills | Status: DC
Start: 2018-07-18 — End: 2018-07-20

## 2018-07-18 NOTE — Addendum Note (Signed)
Addendum Note  by Fredricka Bonine at 07/18/18 1500                Author: Fredricka Bonine  Service: --  Author Type: Technician       Filed: 07/18/18 1608  Encounter Date: 07/18/2018  Status: Signed          Editor: Fredricka Bonine (Technician)          Addended by: Fredricka Bonine on: 07/18/2018 04:08 PM    Modules accepted: Orders

## 2018-07-18 NOTE — Progress Notes (Signed)
SUBJECTIVE:   Jill Kaufman is a 53 y.o. female presents today as a new patient requesting access to healthcare.  Patient would like to be set up for physical to include lab work.  Is been a year since she has had any blood work she states.    Patient reports no chest pain, shortness of breath, orthopnea or PND.  She recently was diagnosed with breast cancer and is started radiation treatments as of today.  She states that the cancer is contained and she will only need 4 weeks of radiation.    PMH.  High cholesterol, right breast cancer, hypothyroidism    PSH.  Lumpectomy right breast, partial hysterectomy    Allergies.  Codeine causes nausea.  She is intolerant to statins.    Social.  Denies tobacco use.  Socially drinks.  Is married.  Has 2 children.  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    Current medicines.  Listed in the EMR and reviewed today.    Family history.  Mother coronary disease.  Father coronary disease, throat cancer, skin cancer and diabetes.  No siblings.    HPI  See above    Past Medical History, Past Surgical History, Family history, Social History, and Medications were all reviewed with the patient today and updated as necessary.       Current Outpatient Medications   Medication Sig Dispense Refill   ??? pitavastatin calcium (LIVALO) 1 mg tab tablet Take 1 Tab by mouth nightly. 90 Tab 3   ??? levothyroxine (SYNTHROID) 75 mcg tablet Take 75 mcg by mouth Daily (before breakfast).     ??? cholecalciferol (VITAMIN D3) (1000 Units /25 mcg) tablet Take  by mouth daily.       Allergies   Allergen Reactions   ??? Codeine Vertigo     Patient Active Problem List   Diagnosis Code   ??? Malignant neoplasm of central portion of right breast in female, estrogen receptor positive ([place]) C50.111, Z17.0   ??? S/P lumpectomy of breast Z98.890     Past Medical History:   Diagnosis Date   ??? Breast cancer ([place])     T1cN0 right breast cancer   ??? GERD (gastroesophageal reflux disease)     OTC meds as needed    ??? High  cholesterol    ??? Nausea & vomiting    ??? Thyroid disease 06/14/2011    Hypothyroidism      Past Surgical History:   Procedure Laterality Date   ??? HX BREAST BIOPSY Right 05/04/2018    RIGHT BREAST NEEDLE LOCALIZED BIOPSY performed by Lorenda Cahill, MD at [place]   ??? HX BREAST LUMPECTOMY Right 05/04/2018    RIGHT BREAST LUMPECTOMY performed by Lorenda Cahill, MD at [place]   ??? HX GYN      hysterectomy partial-still has ovaries     Family History   Problem Relation Age of Onset   ??? Heart Attack Mother    ??? Cancer Father         throat cancer, skin cancer    ??? Diabetes Father    ??? Heart Attack Father      Social History     Tobacco Use   ??? Smoking status: Never Smoker   ??? Smokeless tobacco: Never Used   Substance Use Topics   ??? Alcohol use: Yes     Comment: occassional         Review of Systems  See above  OBJECTIVE:  Visit Vitals  BP 130/78   Ht 5' 3.5" (1.613 m)   Wt 148 lb (67.1 kg)   BMI 25.81 kg/m??        Physical Exam  Constitutional:       Appearance: She is well-developed.   HENT:      Head: Normocephalic and atraumatic.   Eyes:      Pupils: Pupils are equal, round, and reactive to light.   Neck:      Musculoskeletal: Normal range of motion and neck supple.      Thyroid: No thyromegaly.      Vascular: No JVD.   Cardiovascular:      Rate and Rhythm: Normal rate and regular rhythm.      Heart sounds: Normal heart sounds. No murmur. No friction rub. No gallop.    Pulmonary:      Effort: Pulmonary effort is normal. No respiratory distress.      Breath sounds: No wheezing or rales.   Abdominal:      Palpations: Abdomen is soft.      Tenderness: There is no abdominal tenderness. There is no guarding or rebound.   Musculoskeletal: Normal range of motion.   Skin:     Findings: No rash.   Neurological:      Mental Status: She is alert and oriented to person, place, and time.         Medical problems and test results were reviewed with the patient today.         ASSESSMENT and PLAN    1.  Hypothyroidism.   Check TSH.  Set up for physical.    2.  High cholesterol.  Check lipid panel, metabolic panel and CBC.  Set up for physical.  Continue current therapy.  Last LDL was 95.  This was a year ago.    Elements of this note have been dictated using speech recognition software. As a result, errors of speech recognition may have occurred.

## 2018-07-18 NOTE — Progress Notes (Signed)
SUBJECTIVE:   Jill Kaufman is a 53 y.o. female presents today as a new patient requesting access to healthcare.  Patient would like to be set up for physical to include lab work.  Is been a year since she has had any blood work she states.    Patient reports no chest pain, shortness of breath, orthopnea or PND.  She recently was diagnosed with breast cancer and is started radiation treatments as of today.  She states that the cancer is contained and she will only need 4 weeks of radiation.    PMH.  High cholesterol, right breast cancer, hypothyroidism    PSH.  Lumpectomy right breast, partial hysterectomy    Allergies.  Codeine causes nausea.  She is intolerant to statins.    Social.  Denies tobacco use.  Socially drinks.  Is married.  Has 2 children.  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    Current medicines.  Listed in the EMR and reviewed today.    Family history.  Mother coronary disease.  Father coronary disease, throat cancer, skin cancer and diabetes.  No siblings.    HPI  See above    Past Medical History, Past Surgical History, Family history, Social History, and Medications were all reviewed with the patient today and updated as necessary.       Current Outpatient Medications   Medication Sig Dispense Refill   ??? pitavastatin calcium (LIVALO) 1 mg tab tablet Take 1 Tab by mouth nightly. 90 Tab 3   ??? levothyroxine (SYNTHROID) 75 mcg tablet Take 75 mcg by mouth Daily (before breakfast).     ??? cholecalciferol (VITAMIN D3) (1000 Units /25 mcg) tablet Take  by mouth daily.       Allergies   Allergen Reactions   ??? Codeine Vertigo     Patient Active Problem List   Diagnosis Code   ??? Malignant neoplasm of central portion of right breast in female, estrogen receptor positive ([place]) C50.111, Z17.0   ??? S/P lumpectomy of breast Z98.890     Past Medical History:   Diagnosis Date   ??? Breast cancer ([place])     T1cN0 right breast cancer   ??? GERD (gastroesophageal reflux disease)     OTC meds as needed     ??? High cholesterol    ??? Nausea & vomiting    ??? Thyroid disease 06/14/2011    Hypothyroidism      Past Surgical History:   Procedure Laterality Date   ??? HX BREAST BIOPSY Right 05/04/2018    RIGHT BREAST NEEDLE LOCALIZED BIOPSY performed by Lorenda Cahill, MD at [place]   ??? HX BREAST LUMPECTOMY Right 05/04/2018    RIGHT BREAST LUMPECTOMY performed by Lorenda Cahill, MD at [place]   ??? HX GYN      hysterectomy partial-still has ovaries     Family History   Problem Relation Age of Onset   ??? Heart Attack Mother    ??? Cancer Father         throat cancer, skin cancer    ??? Diabetes Father    ??? Heart Attack Father      Social History     Tobacco Use   ??? Smoking status: Never Smoker   ??? Smokeless tobacco: Never Used   Substance Use Topics   ??? Alcohol use: Yes     Comment: occassional         Review of Systems  See above  OBJECTIVE:  Visit Vitals  BP 130/78   Ht 5' 3.5" (1.613 m)   Wt 148 lb (67.1 kg)   BMI 25.81 kg/m??        Physical Exam  Constitutional:       Appearance: She is well-developed.   HENT:      Head: Normocephalic and atraumatic.   Eyes:      Pupils: Pupils are equal, round, and reactive to light.   Neck:      Musculoskeletal: Normal range of motion and neck supple.      Thyroid: No thyromegaly.      Vascular: No JVD.   Cardiovascular:      Rate and Rhythm: Normal rate and regular rhythm.      Heart sounds: Normal heart sounds. No murmur. No friction rub. No gallop.    Pulmonary:      Effort: Pulmonary effort is normal. No respiratory distress.      Breath sounds: No wheezing or rales.   Abdominal:      Palpations: Abdomen is soft.      Tenderness: There is no abdominal tenderness. There is no guarding or rebound.   Musculoskeletal: Normal range of motion.   Skin:     Findings: No rash.   Neurological:      Mental Status: She is alert and oriented to person, place, and time.         Medical problems and test results were reviewed with the patient today.         ASSESSMENT and PLAN     1.  Hypothyroidism.  Check TSH.  Set up for physical.    2.  High cholesterol.  Check lipid panel, metabolic panel and CBC.  Set up for physical.  Continue current therapy.  Last LDL was 95.  This was a year ago.    Elements of this note have been dictated using speech recognition software. As a result, errors of speech recognition may have occurred.

## 2018-07-18 NOTE — Addendum Note (Signed)
Addended by: Fredricka Bonine on: 07/18/2018 04:08 PM     Modules accepted: Orders

## 2018-07-19 ENCOUNTER — Inpatient Hospital Stay: Admit: 2018-07-19 | Payer: PRIVATE HEALTH INSURANCE | Primary: Family Medicine

## 2018-07-19 LAB — METABOLIC PANEL, COMPREHENSIVE
A-G Ratio: 1.8 (ref 1.2–2.2)
ALT (SGPT): 29 IU/L (ref 0–32)
AST (SGOT): 20 IU/L (ref 0–40)
Albumin: 4.7 g/dL (ref 3.8–4.9)
Alk. phosphatase: 87 IU/L (ref 39–117)
BUN/Creatinine ratio: 18 (ref 9–23)
BUN: 14 mg/dL (ref 6–24)
Bilirubin, total: 0.4 mg/dL (ref 0.0–1.2)
CO2: 24 mmol/L (ref 20–29)
Calcium: 9.3 mg/dL (ref 8.7–10.2)
Chloride: 103 mmol/L (ref 96–106)
Creatinine: 0.79 mg/dL (ref 0.57–1.00)
GFR est AA: 100 mL/min/{1.73_m2} (ref >59)
GFR est non-AA: 86 mL/min/{1.73_m2} (ref >59)
GLOBULIN, TOTAL: 2.6 g/dL (ref 1.5–4.5)
Glucose: 90 mg/dL (ref 65–99)
Potassium: 4.2 mmol/L (ref 3.5–5.2)
Protein, total: 7.3 g/dL (ref 6.0–8.5)
Sodium: 141 mmol/L (ref 134–144)

## 2018-07-19 LAB — TSH 3RD GENERATION
TSH: 2.23 u[IU]/mL (ref 0.450–4.500)
TSH: 2.23 u[IU]/mL (ref 0.450–4.500)

## 2018-07-19 LAB — LIPID PANEL
Cholesterol, Total: 177 mg/dL (ref 100–199)
Cholesterol, total: 177 mg/dL (ref 100–199)
HDL Cholesterol: 48 mg/dL (ref >39)
HDL: 48 mg/dL (ref >39)
LDL Calculated: 97 mg/dL (ref 0–99)
LDL, calculated: 97 mg/dL (ref 0–99)
Triglyceride: 159 mg/dL — ABNORMAL HIGH (ref 0–149)
Triglycerides: 159 mg/dL — ABNORMAL HIGH (ref 0–149)
VLDL Cholesterol Calculated: 32 mg/dL (ref 5–40)
VLDL, calculated: 32 mg/dL (ref 5–40)

## 2018-07-19 LAB — COMPREHENSIVE METABOLIC PANEL
ALT: 29 IU/L (ref 0–32)
AST: 20 IU/L (ref 0–40)
Albumin/Globulin Ratio: 1.8 NA (ref 1.2–2.2)
Albumin: 4.7 g/dL (ref 3.8–4.9)
Alkaline Phosphatase: 87 IU/L (ref 39–117)
BUN: 14 mg/dL (ref 6–24)
Bun/Cre Ratio: 18 NA (ref 9–23)
CO2: 24 mmol/L (ref 20–29)
Calcium: 9.3 mg/dL (ref 8.7–10.2)
Chloride: 103 mmol/L (ref 96–106)
Creatinine: 0.79 mg/dL (ref 0.57–1.00)
EGFR IF NonAfrican American: 86 mL/min/{1.73_m2} (ref >59)
GFR African American: 100 mL/min/{1.73_m2} (ref >59)
Globulin, Total: 2.6 g/dL (ref 1.5–4.5)
Glucose: 90 mg/dL (ref 65–99)
Potassium: 4.2 mmol/L (ref 3.5–5.2)
Sodium: 141 mmol/L (ref 134–144)
Total Bilirubin: 0.4 mg/dL (ref 0.0–1.2)
Total Protein: 7.3 g/dL (ref 6.0–8.5)

## 2018-07-20 ENCOUNTER — Inpatient Hospital Stay: Admit: 2018-07-20 | Payer: PRIVATE HEALTH INSURANCE | Primary: Family Medicine

## 2018-07-20 MED ORDER — PITAVASTATIN 1 MG TAB
1 mg | ORAL_TABLET | Freq: Every evening | ORAL | 3 refills | Status: DC
Start: 2018-07-20 — End: 2018-09-25

## 2018-07-20 NOTE — Telephone Encounter (Signed)
Medication too expensive at the [facility]. Medication e-scribed to the [facility].

## 2018-07-23 ENCOUNTER — Inpatient Hospital Stay: Admit: 2018-07-23 | Payer: PRIVATE HEALTH INSURANCE | Primary: Family Medicine

## 2018-07-24 ENCOUNTER — Inpatient Hospital Stay: Admit: 2018-07-24 | Payer: PRIVATE HEALTH INSURANCE | Primary: Family Medicine

## 2018-07-24 NOTE — Progress Notes (Signed)
Patient: Jill Kaufman MRN: [id number]  SSN: [id number]    Date of Birth: [birth year]  Age: 53 y.o.  Sex: female      DIAGNOSIS:  invasive ductal carcinoma of the right breast, ER/PR positive, HER-2 negative, grade 2, pT1c pN0, Stage IA    SITE TREATED AND DOSE DELIVERED:  Right breast has received 1330 cGy of 4256 cGy in 5/16 fractions. Boost to follow.    SUBJECTIVE:  Jill Kaufman is a 53 y.o. female being treated for stage I right breast cancer.  She is doing well without complaints.       OBJECTIVE:  No skin reaction.    There were no vitals taken for this visit.    Lab Results   Component Value Date/Time    Sodium 141 07/18/2018 03:55 PM    Potassium 4.2 07/18/2018 03:55 PM    Chloride 103 07/18/2018 03:55 PM    CO2 24 07/18/2018 03:55 PM    Glucose 90 07/18/2018 03:55 PM    BUN 14 07/18/2018 03:55 PM    Creatinine 0.79 07/18/2018 03:55 PM    GFR est AA 100 07/18/2018 03:55 PM    GFR est non-AA 86 07/18/2018 03:55 PM    Calcium 9.3 07/18/2018 03:55 PM    Albumin 4.7 07/18/2018 03:55 PM    Protein, total 7.3 07/18/2018 03:55 PM    A-G Ratio 1.8 07/18/2018 03:55 PM    AST (SGOT) 20 07/18/2018 03:55 PM    ALT (SGPT) 29 07/18/2018 03:55 PM     No results found for: WBC, HGB, HCT, PLT, HGBEXT, HCTEXT, PLTEXT    ASSESSMENT and PLAN:  Jill Kaufman is tolerating radiation as anticipated for the current dose and fraction.  We will continue on as planned with another treatment visit anticipated next week.        Milta Deiters, MD   July 24, 2018

## 2018-07-25 ENCOUNTER — Encounter: Payer: PRIVATE HEALTH INSURANCE | Primary: Family Medicine

## 2018-07-25 ENCOUNTER — Inpatient Hospital Stay: Admit: 2018-07-25 | Payer: PRIVATE HEALTH INSURANCE | Primary: Family Medicine

## 2018-07-26 ENCOUNTER — Encounter: Payer: PRIVATE HEALTH INSURANCE | Primary: Family Medicine

## 2018-07-26 ENCOUNTER — Inpatient Hospital Stay: Admit: 2018-07-26 | Payer: PRIVATE HEALTH INSURANCE | Primary: Family Medicine

## 2018-07-27 ENCOUNTER — Encounter: Payer: PRIVATE HEALTH INSURANCE | Primary: Family Medicine

## 2018-07-27 ENCOUNTER — Inpatient Hospital Stay: Admit: 2018-07-27 | Payer: PRIVATE HEALTH INSURANCE | Primary: Family Medicine

## 2018-07-30 ENCOUNTER — Inpatient Hospital Stay: Admit: 2018-07-30 | Payer: PRIVATE HEALTH INSURANCE | Primary: Family Medicine

## 2018-07-30 ENCOUNTER — Encounter: Payer: PRIVATE HEALTH INSURANCE | Primary: Family Medicine

## 2018-07-31 ENCOUNTER — Encounter: Payer: PRIVATE HEALTH INSURANCE | Primary: Family Medicine

## 2018-07-31 ENCOUNTER — Inpatient Hospital Stay: Admit: 2018-07-31 | Payer: PRIVATE HEALTH INSURANCE | Primary: Family Medicine

## 2018-07-31 NOTE — Progress Notes (Signed)
Patient: Jill Kaufman MRN: [id number]  SSN: [id number]    Date of Birth: [birth year]  Age: 53 y.o.  Sex: female      DIAGNOSIS:  invasive ductal carcinoma of the right breast, ER/PR positive, HER-2 negative, grade 2, pT1c pN0, Stage IA    SITE TREATED AND DOSE DELIVERED:  Right breast has received 2660 cGy of 4256 cGy in 10/16 fractions. Boost to follow.    SUBJECTIVE:  Jill Kaufman is a 53 y.o. female being treated for stage I right breast cancer.  She is doing well without complaints. Week 2 no complaints.      OBJECTIVE:  No skin reaction.    There were no vitals taken for this visit.    Lab Results   Component Value Date/Time    Sodium 141 07/18/2018 03:55 PM    Potassium 4.2 07/18/2018 03:55 PM    Chloride 103 07/18/2018 03:55 PM    CO2 24 07/18/2018 03:55 PM    Glucose 90 07/18/2018 03:55 PM    BUN 14 07/18/2018 03:55 PM    Creatinine 0.79 07/18/2018 03:55 PM    GFR est AA 100 07/18/2018 03:55 PM    GFR est non-AA 86 07/18/2018 03:55 PM    Calcium 9.3 07/18/2018 03:55 PM    Albumin 4.7 07/18/2018 03:55 PM    Protein, total 7.3 07/18/2018 03:55 PM    A-G Ratio 1.8 07/18/2018 03:55 PM    AST (SGOT) 20 07/18/2018 03:55 PM    ALT (SGPT) 29 07/18/2018 03:55 PM     No results found for: WBC, HGB, HCT, PLT, HGBEXT, HCTEXT, PLTEXT, HGBEXT, HCTEXT, PLTEXT    ASSESSMENT and PLAN:  Jill Kaufman is tolerating radiation as anticipated for the current dose and fraction.  We will continue on as planned with another treatment visit anticipated next week.        Milta Deiters, MD   July 31, 2018

## 2018-08-01 ENCOUNTER — Encounter: Payer: PRIVATE HEALTH INSURANCE | Primary: Family Medicine

## 2018-08-01 ENCOUNTER — Inpatient Hospital Stay: Admit: 2018-08-01 | Payer: PRIVATE HEALTH INSURANCE | Primary: Family Medicine

## 2018-08-02 ENCOUNTER — Encounter: Payer: PRIVATE HEALTH INSURANCE | Primary: Family Medicine

## 2018-08-02 ENCOUNTER — Inpatient Hospital Stay: Admit: 2018-08-02 | Payer: PRIVATE HEALTH INSURANCE | Primary: Family Medicine

## 2018-08-03 ENCOUNTER — Ambulatory Visit: Attending: Family Medicine | Primary: Family Medicine

## 2018-08-03 ENCOUNTER — Ambulatory Visit
Admit: 2018-08-03 | Discharge: 2018-08-03 | Payer: PRIVATE HEALTH INSURANCE | Attending: Family Medicine | Primary: Family Medicine

## 2018-08-03 ENCOUNTER — Encounter: Payer: PRIVATE HEALTH INSURANCE | Primary: Family Medicine

## 2018-08-03 ENCOUNTER — Inpatient Hospital Stay: Admit: 2018-08-03 | Payer: PRIVATE HEALTH INSURANCE | Primary: Family Medicine

## 2018-08-03 DIAGNOSIS — Z0000 Encounter for general adult medical examination without abnormal findings: Secondary | ICD-10-CM

## 2018-08-03 NOTE — Addendum Note (Signed)
Addendum Note by Elvis Coil, MD at 08/03/18 1030                Author: Elvis Coil, MD  Service: --  Author Type: Physician       Filed: 08/03/18 1139  Encounter Date: 08/03/2018  Status: Signed          Editor: Elvis Coil, MD (Physician)          Addended by: Elvis Coil on: 08/03/2018 11:39 AM    Modules accepted: Orders

## 2018-08-03 NOTE — Progress Notes (Signed)
SUBJECTIVE:   Jill Kaufman is a 53 y.o. female who is here for CPX.  Patient has a past medical history significant for breast cancer, hypothyroidism and high cholesterol.  Review of systems reveals no complaints of chest pain, shortness of breath, orthopnea or PND.  GI and GU review of systems is unremarkable.  Current medications are listed in the EMR and reviewed today.  She does report a painful callus on the bottom of her right foot this been there for several weeks.    HPI  See above    Past Medical History, Past Surgical History, Family history, Social History, and Medications were all reviewed with the patient today and updated as necessary.       Current Outpatient Medications   Medication Sig Dispense Refill   ??? pitavastatin calcium (LIVALO) 1 mg tab tablet Take 1 Tab by mouth nightly. 90 Tab 3   ??? cholecalciferol (VITAMIN D3) (1000 Units /25 mcg) tablet Take  by mouth daily.     ??? levothyroxine (SYNTHROID) 75 mcg tablet Take 75 mcg by mouth Daily (before breakfast).       Allergies   Allergen Reactions   ??? Codeine Vertigo     Patient Active Problem List   Diagnosis Code   ??? Malignant neoplasm of central portion of right breast in female, estrogen receptor positive ([place]) C50.111, Z17.0   ??? S/P lumpectomy of breast Z98.890     Past Medical History:   Diagnosis Date   ??? Breast cancer ([place])     T1cN0 right breast cancer   ??? GERD (gastroesophageal reflux disease)     OTC meds as needed    ??? High cholesterol    ??? Nausea & vomiting    ??? Thyroid disease 06/14/2011    Hypothyroidism      Past Surgical History:   Procedure Laterality Date   ??? HX BREAST BIOPSY Right 05/04/2018    RIGHT BREAST NEEDLE LOCALIZED BIOPSY performed by Lorenda Cahill, MD at [place]   ??? HX BREAST LUMPECTOMY Right 05/04/2018    RIGHT BREAST LUMPECTOMY performed by Lorenda Cahill, MD at [place]   ??? HX GYN      hysterectomy partial-still has ovaries     Family History   Problem Relation Age of Onset   ??? Heart Attack Mother    ???  Cancer Father         throat cancer, skin cancer    ??? Diabetes Father    ??? Heart Attack Father      Social History     Tobacco Use   ??? Smoking status: Never Smoker   ??? Smokeless tobacco: Never Used   Substance Use Topics   ??? Alcohol use: Yes     Comment: occassional         Review of Systems  See above    OBJECTIVE:  Visit Vitals  Ht 5' 3.5" (1.613 m)   Wt 146 lb 12.8 oz (66.6 kg)   BMI 25.60 kg/m??        Physical Exam  Constitutional:       Appearance: She is well-developed.   HENT:      Head: Normocephalic and atraumatic.      Right Ear: External ear normal.      Left Ear: External ear normal.      Nose: Nose normal.      Mouth/Throat:      Pharynx: No oropharyngeal exudate.   Eyes:  General: No scleral icterus.        Right eye: No discharge.         Left eye: No discharge.      Pupils: Pupils are equal, round, and reactive to light.   Neck:      Musculoskeletal: Normal range of motion and neck supple.      Thyroid: No thyromegaly.      Vascular: No JVD.      Trachea: No tracheal deviation.   Cardiovascular:      Rate and Rhythm: Normal rate and regular rhythm.      Heart sounds: Normal heart sounds. No murmur. No friction rub. No gallop.    Pulmonary:      Effort: Pulmonary effort is normal. No respiratory distress.      Breath sounds: Normal breath sounds. No wheezing or rales.   Chest:      Chest wall: No tenderness.   Abdominal:      General: Bowel sounds are normal. There is no distension.      Palpations: Abdomen is soft. There is no mass.      Tenderness: There is no abdominal tenderness. There is no guarding or rebound.   Musculoskeletal: Normal range of motion.         General: No tenderness.   Lymphadenopathy:      Cervical: No cervical adenopathy.   Skin:     General: Skin is warm and dry.      Findings: No erythema or rash.      Comments: Callus on bottom of right foot is noted.   Neurological:      Mental Status: She is alert and oriented to person, place, and time.      Cranial Nerves: No  cranial nerve deficit.      Motor: No abnormal muscle tone.      Coordination: Coordination normal.      Deep Tendon Reflexes: Reflexes are normal and symmetric. Reflexes normal.   Psychiatric:         Behavior: Behavior normal.         Thought Content: Thought content normal.         Judgment: Judgment normal.         Medical problems and test results were reviewed with the patient today.         ASSESSMENT and PLAN    1.  CPX.  Anticipatory guidance discussed including the importance of sunscreen use, helmet use and seatbelt use.  Gynecological care is up-to-date.  Mammography is up-to-date.  Colonoscopy is not.  Patient will be referred accordingly.    2.  High cholesterol.  LDL is 97.  Continue current therapy.  Liver enzymes remain normal.  Has been intolerant to multiple statins in the past.  Continue Livalo.    3.  Hypothyroidism.  TSH is 2.2.  Continue current dose of Synthroid.    4.  History of breast cancer.  Continue follow-up with oncology.  Undergoing radiation therapy.    5.  Callus right foot.  Callus was parred today with subjective improvement.    Elements of this note have been dictated using speech recognition software. As a result, errors of speech recognition may have occurred.

## 2018-08-03 NOTE — Progress Notes (Signed)
SUBJECTIVE:   Jill Kaufman is a 53 y.o. female who is here for CPX.  Patient has a past medical history significant for breast cancer, hypothyroidism and high cholesterol.  Review of systems reveals no complaints of chest pain, shortness of breath, orthopnea or PND.  GI and GU review of systems is unremarkable.  Current medications are listed in the EMR and reviewed today.  She does report a painful callus on the bottom of her right foot this been there for several weeks.    HPI  See above    Past Medical History, Past Surgical History, Family history, Social History, and Medications were all reviewed with the patient today and updated as necessary.       Current Outpatient Medications   Medication Sig Dispense Refill   ??? pitavastatin calcium (LIVALO) 1 mg tab tablet Take 1 Tab by mouth nightly. 90 Tab 3   ??? cholecalciferol (VITAMIN D3) (1000 Units /25 mcg) tablet Take  by mouth daily.     ??? levothyroxine (SYNTHROID) 75 mcg tablet Take 75 mcg by mouth Daily (before breakfast).       Allergies   Allergen Reactions   ??? Codeine Vertigo     Patient Active Problem List   Diagnosis Code   ??? Malignant neoplasm of central portion of right breast in female, estrogen receptor positive ([place]) C50.111, Z17.0   ??? S/P lumpectomy of breast Z98.890     Past Medical History:   Diagnosis Date   ??? Breast cancer ([place])     T1cN0 right breast cancer   ??? GERD (gastroesophageal reflux disease)     OTC meds as needed    ??? High cholesterol    ??? Nausea & vomiting    ??? Thyroid disease 06/14/2011    Hypothyroidism      Past Surgical History:   Procedure Laterality Date   ??? HX BREAST BIOPSY Right 05/04/2018    RIGHT BREAST NEEDLE LOCALIZED BIOPSY performed by Lorenda Cahill, MD at [place]   ??? HX BREAST LUMPECTOMY Right 05/04/2018    RIGHT BREAST LUMPECTOMY performed by Lorenda Cahill, MD at [place]   ??? HX GYN      hysterectomy partial-still has ovaries     Family History   Problem Relation Age of Onset   ??? Heart Attack Mother     ??? Cancer Father         throat cancer, skin cancer    ??? Diabetes Father    ??? Heart Attack Father      Social History     Tobacco Use   ??? Smoking status: Never Smoker   ??? Smokeless tobacco: Never Used   Substance Use Topics   ??? Alcohol use: Yes     Comment: occassional         Review of Systems  See above    OBJECTIVE:  Visit Vitals  Ht 5' 3.5" (1.613 m)   Wt 146 lb 12.8 oz (66.6 kg)   BMI 25.60 kg/m??        Physical Exam  Constitutional:       Appearance: She is well-developed.   HENT:      Head: Normocephalic and atraumatic.      Right Ear: External ear normal.      Left Ear: External ear normal.      Nose: Nose normal.      Mouth/Throat:      Pharynx: No oropharyngeal exudate.   Eyes:  General: No scleral icterus.        Right eye: No discharge.         Left eye: No discharge.      Pupils: Pupils are equal, round, and reactive to light.   Neck:      Musculoskeletal: Normal range of motion and neck supple.      Thyroid: No thyromegaly.      Vascular: No JVD.      Trachea: No tracheal deviation.   Cardiovascular:      Rate and Rhythm: Normal rate and regular rhythm.      Heart sounds: Normal heart sounds. No murmur. No friction rub. No gallop.    Pulmonary:      Effort: Pulmonary effort is normal. No respiratory distress.      Breath sounds: Normal breath sounds. No wheezing or rales.   Chest:      Chest wall: No tenderness.   Abdominal:      General: Bowel sounds are normal. There is no distension.      Palpations: Abdomen is soft. There is no mass.      Tenderness: There is no abdominal tenderness. There is no guarding or rebound.   Musculoskeletal: Normal range of motion.         General: No tenderness.   Lymphadenopathy:      Cervical: No cervical adenopathy.   Skin:     General: Skin is warm and dry.      Findings: No erythema or rash.      Comments: Callus on bottom of right foot is noted.   Neurological:      Mental Status: She is alert and oriented to person, place, and time.       Cranial Nerves: No cranial nerve deficit.      Motor: No abnormal muscle tone.      Coordination: Coordination normal.      Deep Tendon Reflexes: Reflexes are normal and symmetric. Reflexes normal.   Psychiatric:         Behavior: Behavior normal.         Thought Content: Thought content normal.         Judgment: Judgment normal.         Medical problems and test results were reviewed with the patient today.         ASSESSMENT and PLAN    1.  CPX.  Anticipatory guidance discussed including the importance of sunscreen use, helmet use and seatbelt use.  Gynecological care is up-to-date.  Mammography is up-to-date.  Colonoscopy is not.  Patient will be referred accordingly.    2.  High cholesterol.  LDL is 97.  Continue current therapy.  Liver enzymes remain normal.  Has been intolerant to multiple statins in the past.  Continue Livalo.    3.  Hypothyroidism.  TSH is 2.2.  Continue current dose of Synthroid.    4.  History of breast cancer.  Continue follow-up with oncology.  Undergoing radiation therapy.    5.  Callus right foot.  Callus was parred today with subjective improvement.    Elements of this note have been dictated using speech recognition software. As a result, errors of speech recognition may have occurred.

## 2018-08-03 NOTE — Addendum Note (Signed)
Addended by: Elvis Coil on: 08/03/2018 11:39 AM     Modules accepted: Orders

## 2018-08-06 ENCOUNTER — Encounter: Payer: PRIVATE HEALTH INSURANCE | Primary: Family Medicine

## 2018-08-06 ENCOUNTER — Inpatient Hospital Stay: Admit: 2018-08-06 | Payer: PRIVATE HEALTH INSURANCE | Primary: Family Medicine

## 2018-08-07 ENCOUNTER — Inpatient Hospital Stay: Admit: 2018-08-07 | Payer: PRIVATE HEALTH INSURANCE | Primary: Family Medicine

## 2018-08-07 ENCOUNTER — Encounter: Payer: BLUE CROSS/BLUE SHIELD | Attending: Internal Medicine | Primary: Family Medicine

## 2018-08-07 ENCOUNTER — Encounter: Payer: PRIVATE HEALTH INSURANCE | Primary: Family Medicine

## 2018-08-07 ENCOUNTER — Encounter: Primary: Family Medicine

## 2018-08-07 NOTE — Progress Notes (Signed)
Patient: Jill Kaufman MRN: [id number]  SSN: [id number]    Date of Birth: 04-15-1966  Age: 53 y.o.  Sex: female      DIAGNOSIS:  invasive ductal carcinoma of the right breast, ER/PR positive, HER-2 negative, grade 2, pT1c pN0, Stage IA    SITE TREATED AND DOSE DELIVERED:  Right breast has received 3990cGy of 4256 cGy in 15/16 fractions. Boost to follow.    SUBJECTIVE:  Jill Kaufman is a 53 y.o. female being treated for stage I right breast cancer.  She is doing well without complaints. Week 2 no complaints.  Week 3 Mild skin reaction with a small amount of folliculitis.    OBJECTIVE:  Mild skin reaction.    There were no vitals taken for this visit.    Lab Results   Component Value Date/Time    Sodium 141 07/18/2018 03:55 PM    Potassium 4.2 07/18/2018 03:55 PM    Chloride 103 07/18/2018 03:55 PM    CO2 24 07/18/2018 03:55 PM    Glucose 90 07/18/2018 03:55 PM    BUN 14 07/18/2018 03:55 PM    Creatinine 0.79 07/18/2018 03:55 PM    GFR est AA 100 07/18/2018 03:55 PM    GFR est non-AA 86 07/18/2018 03:55 PM    Calcium 9.3 07/18/2018 03:55 PM    Albumin 4.7 07/18/2018 03:55 PM    Protein, total 7.3 07/18/2018 03:55 PM    A-G Ratio 1.8 07/18/2018 03:55 PM    AST (SGOT) 20 07/18/2018 03:55 PM    ALT (SGPT) 29 07/18/2018 03:55 PM     No results found for: WBC, HGB, HCT, PLT, HGBEXT, HCTEXT, PLTEXT, HGBEXT, HCTEXT, PLTEXT    ASSESSMENT and PLAN:  Yarah Fuente is tolerating radiation as anticipated for the current dose and fraction.  We will continue on as planned with another treatment visit anticipated next week.        Milta Deiters, MD   August 07, 2018

## 2018-08-07 NOTE — Progress Notes (Signed)
Patient: Jill Kaufman MRN: [id number]  SSN: [id number]    Date of Birth: 04/23/1966  Age: 52 y.o.  Sex: female      DIAGNOSIS:  invasive ductal carcinoma of the right breast, ER/PR positive, HER-2 negative, grade 2, pT1c pN0, Stage IA    SITE TREATED AND DOSE DELIVERED:  Right breast has received 3990cGy of 4256 cGy in 15/16 fractions. Boost to follow.    SUBJECTIVE:  Jill Kaufman is a 52 y.o. female being treated for stage I right breast cancer.  She is doing well without complaints. Week 2 no complaints.  Week 3 Mild skin reaction with a small amount of folliculitis.    OBJECTIVE:  Mild skin reaction.    There were no vitals taken for this visit.    Lab Results   Component Value Date/Time    Sodium 141 07/18/2018 03:55 PM    Potassium 4.2 07/18/2018 03:55 PM    Chloride 103 07/18/2018 03:55 PM    CO2 24 07/18/2018 03:55 PM    Glucose 90 07/18/2018 03:55 PM    BUN 14 07/18/2018 03:55 PM    Creatinine 0.79 07/18/2018 03:55 PM    GFR est AA 100 07/18/2018 03:55 PM    GFR est non-AA 86 07/18/2018 03:55 PM    Calcium 9.3 07/18/2018 03:55 PM    Albumin 4.7 07/18/2018 03:55 PM    Protein, total 7.3 07/18/2018 03:55 PM    A-G Ratio 1.8 07/18/2018 03:55 PM    AST (SGOT) 20 07/18/2018 03:55 PM    ALT (SGPT) 29 07/18/2018 03:55 PM     No results found for: WBC, HGB, HCT, PLT, HGBEXT, HCTEXT, PLTEXT, HGBEXT, HCTEXT, PLTEXT    ASSESSMENT and PLAN:  Jill Kaufman is tolerating radiation as anticipated for the current dose and fraction.  We will continue on as planned with another treatment visit anticipated next week.        Jill Kaufman Jill Tabius Rood, MD   August 07, 2018

## 2018-08-08 ENCOUNTER — Encounter: Payer: PRIVATE HEALTH INSURANCE | Primary: Family Medicine

## 2018-08-08 ENCOUNTER — Inpatient Hospital Stay: Admit: 2018-08-08 | Payer: PRIVATE HEALTH INSURANCE | Primary: Family Medicine

## 2018-08-09 ENCOUNTER — Encounter: Payer: PRIVATE HEALTH INSURANCE | Primary: Family Medicine

## 2018-08-09 ENCOUNTER — Inpatient Hospital Stay: Admit: 2018-08-09 | Payer: PRIVATE HEALTH INSURANCE | Primary: Family Medicine

## 2018-08-10 ENCOUNTER — Inpatient Hospital Stay: Admit: 2018-08-10 | Payer: PRIVATE HEALTH INSURANCE | Primary: Family Medicine

## 2018-08-10 ENCOUNTER — Encounter: Payer: PRIVATE HEALTH INSURANCE | Primary: Family Medicine

## 2018-08-13 ENCOUNTER — Encounter: Payer: PRIVATE HEALTH INSURANCE | Primary: Family Medicine

## 2018-08-14 ENCOUNTER — Inpatient Hospital Stay: Admit: 2018-08-14 | Payer: PRIVATE HEALTH INSURANCE | Primary: Family Medicine

## 2018-08-14 ENCOUNTER — Encounter: Payer: PRIVATE HEALTH INSURANCE | Primary: Family Medicine

## 2018-08-14 DIAGNOSIS — Z510 Encounter for antineoplastic radiation therapy: Secondary | ICD-10-CM

## 2018-08-14 NOTE — Progress Notes (Signed)
Patient: Jill Kaufman MRN: [id number]  SSN: [id number]    Date of Birth: Nov 28, 1965  Age: 53 y.o.  Sex: female      DIAGNOSIS:  invasive ductal carcinoma of the right breast, ER/PR positive, HER-2 negative, grade 2, pT1c pN0, Stage IA    SITE TREATED AND DOSE DELIVERED:  Right breast has received 4256 cGy of 4256 cGy in 16/16 fractions. Boost has received 750 cGy of 1000 cGy in 3/4 fractions.    SUBJECTIVE:  Jill Kaufman is a 53 y.o. female being treated for stage I right breast cancer.  She is doing well without complaints. Week 2 no complaints.  Week 3 Mild skin reaction with a small amount of folliculitis. Week 4 no changes.    OBJECTIVE:  Mild skin erythema and folliculitis.     There were no vitals taken for this visit.    Lab Results   Component Value Date/Time    Sodium 141 07/18/2018 03:55 PM    Potassium 4.2 07/18/2018 03:55 PM    Chloride 103 07/18/2018 03:55 PM    CO2 24 07/18/2018 03:55 PM    Glucose 90 07/18/2018 03:55 PM    BUN 14 07/18/2018 03:55 PM    Creatinine 0.79 07/18/2018 03:55 PM    GFR est AA 100 07/18/2018 03:55 PM    GFR est non-AA 86 07/18/2018 03:55 PM    Calcium 9.3 07/18/2018 03:55 PM    Albumin 4.7 07/18/2018 03:55 PM    Protein, total 7.3 07/18/2018 03:55 PM    A-G Ratio 1.8 07/18/2018 03:55 PM    AST (SGOT) 20 07/18/2018 03:55 PM    ALT (SGPT) 29 07/18/2018 03:55 PM     No results found for: WBC, HGB, HCT, PLT, HGBEXT, HCTEXT, PLTEXT, HGBEXT, HCTEXT, PLTEXT    ASSESSMENT and PLAN:  Jill Kaufman is tolerating radiation as anticipated for the current dose and fraction.  We will continue on as planned. She will complete treatment tomorrow and then return for follow-up in 1 month.      Milta Deiters, MD   August 14, 2018

## 2018-08-15 ENCOUNTER — Inpatient Hospital Stay: Admit: 2018-08-15 | Payer: PRIVATE HEALTH INSURANCE | Primary: Family Medicine

## 2018-08-15 NOTE — Discharge Summary (Signed)
Patient: Jill Kaufman MRN: [id number]  SSN: [id number]    Date of Birth: 08/20/1965  Age: 53 y.o.  Sex: female      Jill Kaufman is a 53 y.o. female who was seen by radiation oncology at the request of Dr. Domenica Fail.  She has a medical history significant for hypercholesterolemia, hypothyroidism,  and GERD. She underwent routine screening mammogram in 09/19 that showed an irregular mass density in the upper outer right breast.  She underwent ultrasound and biopsy that showed invasive ductal carcinoma, grade 2, ER 95%, PR 95%, HER-2 negative.  MRI of the breasts showed the lesion to measure 1.5 cm with no other abnormalities.   She was seen by Dr. Domenica Fail and underwent lumpectomy and sentinel lymph node biopsy on 05/04/18.  Pathology demonstrated a 1.6 cm tumor with negative margins and 0/1 SLN involved. She was then seen by Dr. Jenetta Loges for discussion of systemic therapy. She had an Oncotype Dx score of 6 (low).  She was not recommended to undergo chemotherapy, but was recommended to consider endocrine therapy.  Specifically, given her perimenopausal state, she is recommended to undergo 2 years of tamoxifen followed by an AI.      She met with Dr. Maceo Pro in consultation 07/03/2018 to discuss the role of radiation therapy in further management of her Stage IA right breast cancer. The recommendation was for adjuvant radiation therapy to the breast as a standard portion of breast conservation therapy.   ??  Please see the details of her treatment below as well as my plans for future care and surveillance.  Please do not hesitate to call with questions or concerns at any time.      DIAGNOSIS: invasive ductal carcinoma of the right breast, ER/PR positive, HER-2 negative, grade 2, pT1c pN0, Stage IA    PREVIOUS TREATMENT:    1) lumpectomy and sentinel lymph node biopsy on 05/04/18    TREATMENT DATES:    1) Right breast: 07/18/2018 - 08/08/2018  2) Right breast boost: 07/15/2018 - 08/15/2018    ANATOMIC SITE:  Right breast    DOSE:    1)  Right breast: 4256 cGy in 16 fractions  2) Right breast boost: 1000 cGy in 4 fractions    BEAM ARRANGEMENT:    1) Right breast: 3D Conformal - 6MV  2) Right breast boost: 3D Conformal - 12E    CHEMOTHERAPY: None    TREATMENT COURSE:  Jill Kaufman did well without complaints with week 1 and 2. She had no complaints. On week 3, she had some mild skin reaction with a small amount of folliculitis. There were no changes with week 4. ??     PLAN:  The patient will be seen in follow up in 4 weeks to assess acute and sub acute side effects.      Romero Liner, NP

## 2018-08-15 NOTE — Discharge Summary (Signed)
Patient: Jill Kaufman MRN: [id number]  SSN: [id number]    Date of Birth: June 24, 1965  Age: 53 y.o.  Sex: female      Jill Kaufman is a 53 y.o. female who was seen by radiation oncology at the request of Dr. Domenica Fail.  She has a medical history significant for hypercholesterolemia, hypothyroidism,  and GERD. She underwent routine screening mammogram in 09/19 that showed an irregular mass density in the upper outer right breast.  She underwent ultrasound and biopsy that showed invasive ductal carcinoma, grade 2, ER 95%, PR 95%, HER-2 negative.  MRI of the breasts showed the lesion to measure 1.5 cm with no other abnormalities.   She was seen by Dr. Domenica Fail and underwent lumpectomy and sentinel lymph node biopsy on 05/04/18.  Pathology demonstrated a 1.6 cm tumor with negative margins and 0/1 SLN involved. She was then seen by Dr. Jenetta Loges for discussion of systemic therapy. She had an Oncotype Dx score of 6 (low).  She was not recommended to undergo chemotherapy, but was recommended to consider endocrine therapy.  Specifically, given her perimenopausal state, she is recommended to undergo 2 years of tamoxifen followed by an AI.      She met with Dr. Maceo Pro in consultation 07/03/2018 to discuss the role of radiation therapy in further management of her Stage IA right breast cancer. The recommendation was for adjuvant radiation therapy to the breast as a standard portion of breast conservation therapy.   ??  Please see the details of her treatment below as well as my plans for future care and surveillance.  Please do not hesitate to call with questions or concerns at any time.      DIAGNOSIS: invasive ductal carcinoma of the right breast, ER/PR positive, HER-2 negative, grade 2, pT1c pN0, Stage IA    PREVIOUS TREATMENT:    1) lumpectomy and sentinel lymph node biopsy on 05/04/18    TREATMENT DATES:    1) Right breast: 07/18/2018 - 08/08/2018  2) Right breast boost: 07/15/2018 - 08/15/2018    ANATOMIC SITE:  Right breast    DOSE:     1) Right breast: 4256 cGy in 16 fractions  2) Right breast boost: 1000 cGy in 4 fractions    BEAM ARRANGEMENT:    1) Right breast: 3D Conformal - 6MV  2) Right breast boost: 3D Conformal - 12E    CHEMOTHERAPY: None    TREATMENT COURSE:  Jill Kaufman did well without complaints with week 1 and 2. She had no complaints. On week 3, she had some mild skin reaction with a small amount of folliculitis. There were no changes with week 4. ??     PLAN:  The patient will be seen in follow up in 4 weeks to assess acute and sub acute side effects.      Romero Liner, NP

## 2018-08-20 ENCOUNTER — Encounter

## 2018-08-22 ENCOUNTER — Ambulatory Visit: Attending: Internal Medicine | Primary: Family Medicine

## 2018-08-22 ENCOUNTER — Ambulatory Visit
Admit: 2018-08-22 | Discharge: 2018-08-22 | Payer: PRIVATE HEALTH INSURANCE | Attending: Internal Medicine | Primary: Family Medicine

## 2018-08-22 ENCOUNTER — Inpatient Hospital Stay: Admit: 2018-08-22 | Payer: PRIVATE HEALTH INSURANCE | Primary: Family Medicine

## 2018-08-22 DIAGNOSIS — C50111 Malignant neoplasm of central portion of right female breast: Secondary | ICD-10-CM

## 2018-08-22 DIAGNOSIS — Z170 Estrogen receptor positive status [ER+]: Secondary | ICD-10-CM

## 2018-08-22 LAB — CBC WITH AUTOMATED DIFF
ABS. BASOPHILS: 0 10*3/uL (ref 0.0–0.2)
ABS. EOSINOPHILS: 0.1 10*3/uL (ref 0.0–0.8)
ABS. IMM. GRANS.: 0 10*3/uL (ref 0.0–0.5)
ABS. LYMPHOCYTES: 1 10*3/uL (ref 0.5–4.6)
ABS. MONOCYTES: 0.4 10*3/uL (ref 0.1–1.3)
ABS. NEUTROPHILS: 5 10*3/uL (ref 1.7–8.2)
ABSOLUTE NRBC: 0 10*3/uL (ref 0.0–0.2)
BASOPHILS: 1 % (ref 0.0–2.0)
EOSINOPHILS: 1 % (ref 0.5–7.8)
HCT: 38.6 % (ref 35.8–46.3)
HGB: 13.3 g/dL (ref 11.7–15.4)
IMMATURE GRANULOCYTES: 0 % (ref 0.0–5.0)
LYMPHOCYTES: 15 % (ref 13–44)
MCH: 30 PG (ref 26.1–32.9)
MCHC: 34.5 g/dL (ref 31.4–35.0)
MCV: 87.1 FL (ref 79.6–97.8)
MONOCYTES: 6 % (ref 4.0–12.0)
MPV: 10.4 FL (ref 9.4–12.3)
NEUTROPHILS: 77 % (ref 43–78)
PLATELET: 291 10*3/uL (ref 150–450)
RBC: 4.43 M/uL (ref 4.05–5.25)
RDW: 12.3 % (ref 11.9–14.6)
WBC: 6.5 10*3/uL (ref 4.3–11.1)

## 2018-08-22 LAB — METABOLIC PANEL, COMPREHENSIVE
A-G Ratio: 1.1 — ABNORMAL LOW (ref 1.2–3.5)
ALT (SGPT): 72 U/L — ABNORMAL HIGH (ref 12–65)
AST (SGOT): 31 U/L (ref 15–37)
Albumin: 4 g/dL (ref 3.5–5.0)
Alk. phosphatase: 93 U/L (ref 50–136)
Anion gap: 3 mmol/L — ABNORMAL LOW (ref 7–16)
BUN: 15 MG/DL (ref 6–23)
Bilirubin, total: 0.4 MG/DL (ref 0.2–1.1)
CO2: 29 mmol/L (ref 21–32)
Calcium: 9.1 MG/DL (ref 8.3–10.4)
Chloride: 106 mmol/L (ref 98–107)
Creatinine: 0.86 MG/DL (ref 0.6–1.0)
GFR est AA: >60 mL/min/{1.73_m2} (ref >60)
GFR est non-AA: >60 mL/min/{1.73_m2} (ref >60)
Globulin: 3.5 g/dL (ref 2.3–3.5)
Glucose: 96 mg/dL (ref 65–100)
Potassium: 3.8 mmol/L (ref 3.5–5.1)
Protein, total: 7.5 g/dL (ref 6.3–8.2)
Sodium: 138 mmol/L (ref 136–145)

## 2018-08-22 LAB — CBC WITH AUTO DIFFERENTIAL
Basophils %: 1 % (ref 0.0–2.0)
Basophils Absolute: 0 10*3/uL (ref 0.0–0.2)
Eosinophils %: 1 % (ref 0.5–7.8)
Eosinophils Absolute: 0.1 10*3/uL (ref 0.0–0.8)
Granulocyte Absolute Count: 0 10*3/uL (ref 0.0–0.5)
Hematocrit: 38.6 % (ref 35.8–46.3)
Hemoglobin: 13.3 g/dL (ref 11.7–15.4)
Immature Granulocytes: 0 % (ref 0.0–5.0)
Lymphocytes %: 15 % (ref 13–44)
Lymphocytes Absolute: 1 10*3/uL (ref 0.5–4.6)
MCH: 30 PG (ref 26.1–32.9)
MCHC: 34.5 g/dL (ref 31.4–35.0)
MCV: 87.1 FL (ref 79.6–97.8)
MPV: 10.4 FL (ref 9.4–12.3)
Monocytes %: 6 % (ref 4.0–12.0)
Monocytes Absolute: 0.4 10*3/uL (ref 0.1–1.3)
NRBC Absolute: 0 10*3/uL (ref 0.0–0.2)
Neutrophils %: 77 % (ref 43–78)
Neutrophils Absolute: 5 10*3/uL (ref 1.7–8.2)
Platelets: 291 10*3/uL (ref 150–450)
RBC: 4.43 M/uL (ref 4.05–5.25)
RDW: 12.3 % (ref 11.9–14.6)
WBC: 6.5 10*3/uL (ref 4.3–11.1)

## 2018-08-22 LAB — COMPREHENSIVE METABOLIC PANEL
ALT: 72 U/L — ABNORMAL HIGH (ref 12–65)
AST: 31 U/L (ref 15–37)
Albumin/Globulin Ratio: 1.1 — ABNORMAL LOW (ref 1.2–3.5)
Albumin: 4 g/dL (ref 3.5–5.0)
Alkaline Phosphatase: 93 U/L (ref 50–136)
Anion Gap: 3 mmol/L — ABNORMAL LOW (ref 7–16)
BUN: 15 MG/DL (ref 6–23)
CO2: 29 mmol/L (ref 21–32)
Calcium: 9.1 MG/DL (ref 8.3–10.4)
Chloride: 106 mmol/L (ref 98–107)
Creatinine: 0.86 MG/DL (ref 0.6–1.0)
EGFR IF NonAfrican American: >60 mL/min/{1.73_m2} (ref >60)
GFR African American: >60 mL/min/{1.73_m2} (ref >60)
Globulin: 3.5 g/dL (ref 2.3–3.5)
Glucose: 96 mg/dL (ref 65–100)
Potassium: 3.8 mmol/L (ref 3.5–5.1)
Sodium: 138 mmol/L (ref 136–145)
Total Bilirubin: 0.4 MG/DL (ref 0.2–1.1)
Total Protein: 7.5 g/dL (ref 6.3–8.2)

## 2018-08-22 MED ORDER — TAMOXIFEN 20 MG TAB
20 mg | ORAL_TABLET | Freq: Every day | ORAL | 2 refills | Status: DC
Start: 2018-08-22 — End: 2018-11-01
  Filled 2018-10-15: qty 30, 30d supply, fill #2

## 2018-08-22 NOTE — Progress Notes (Signed)
Progress Notes by Greer Ee, MD at 08/22/18 1315                Author: Greer Ee, MD  Service: --  Author Type: Physician       Filed: 08/22/18 1327  Encounter Date: 08/22/2018  Status: Signed          Editor: Greer Ee, MD (Physician)               [facility] Hematology and Oncology: Office Visit New Patient H & P      Chief Complaint:       Chief Complaint       Patient presents with        ?  Follow-up              History of Present Illness:   Jill Kaufman is a  53 y.o. female who presents today for evaluation regarding breast cancer.  She underwent  routine mammogram in September 2019 which showed an irregular mass density in the upper outer right breast.  She underwent ultrasound and biopsy which showed invasive ductal carcinoma, grade 2, ER 95%, PR 95%, HER2 negative.  MRI showed the lesion to  be 1.5 cm with no other abnormalities.  She was referred to Dr. Domenica Fail and underwent lumpectomy and sentinel node biopsy on 05/04/18 which showed 1.6 cm of tumor with negative margins and 1 sentinel lymph node negative for tumor.  Oncotype Dx testing  was low risk at 6; therefore, chemotherapy can be safely deferred.  We will refer her for radiation therapy, then begin endocrine therapy.  She has completed hysterectomy but her ovaries are intact,  her FSH/LH/E2 levels were borderline so we felt it would be prudent to proceed with tamoxifen for 2-3 years, then transition to AI once she is clearly post-menopausal.  We discussed side effects of  tamoxifen including the risk of VTE, as well as vasomotor symptoms and arthralgias.  She understands and agrees to proceed after radiation is completed.      Here for follow-up.  Doing well, she tolerated radiation very well.  She has a little bit of dermatitis left over, finished one week ago, but overall did quite well.  She is ready to start endocrine therapy.                  Review of Systems:   Constitutional: Negative.    HENT: Negative.    Eyes:  Negative.    Respiratory: Negative.    Cardiovascular: Negative.    Gastrointestinal: Negative.    Genitourinary: Negative.    Musculoskeletal: Negative.    Skin: Negative.    Neurological: Negative.    Endo/Heme/Allergies: Negative.    Psychiatric/Behavioral: Negative.    All other systems reviewed and are negative.         Allergies        Allergen  Reactions         ?  Codeine  Vertigo          Past Medical History:        Diagnosis  Date         ?  Breast cancer ([place])            T1cN0 right breast cancer         ?  GERD (gastroesophageal reflux disease)            OTC meds as needed          ?  High cholesterol       ?  Nausea & vomiting       ?  Thyroid disease  06/14/2011          Hypothyroidism           Past Surgical History:         Procedure  Laterality  Date          ?  HX BREAST BIOPSY  Right  05/04/2018          RIGHT BREAST NEEDLE LOCALIZED BIOPSY performed by Lorenda Cahill, MD at [place]          ?  HX BREAST LUMPECTOMY  Right  05/04/2018          RIGHT BREAST LUMPECTOMY performed by Lorenda Cahill, [place] at [place]          ?  HX GYN              hysterectomy partial-still has ovaries          Family History         Problem  Relation  Age of Onset          ?  Heart Attack  Mother       ?  Cancer  Father                throat cancer, skin cancer           ?  Diabetes  Father            ?  Heart Attack  Father            Social History          Socioeconomic History         ?  Marital status:  MARRIED              Spouse name:  Not on file         ?  Number of children:  Not on file     ?  Years of education:  Not on file     ?  Highest education level:  Not on file       Occupational History        ?  Not on file       Social Needs         ?  Financial resource strain:  Not on file        ?  Food insecurity              Worry:  Not on file         Inability:  Not on file        ?  Transportation needs              Medical:  Not on file         Non-medical:  Not on file       Tobacco Use          ?  Smoking status:  Never Smoker     ?  Smokeless tobacco:  Never Used       Substance and Sexual Activity         ?  Alcohol use:  Yes             Comment: occassional         ?  Drug use:  Not Currently     ?  Sexual  activity:  Not on file       Lifestyle        ?  Physical activity              Days per week:  Not on file         Minutes per session:  Not on file         ?  Stress:  Not on file       Relationships        ?  Social Health visitor on phone:  Not on file         Gets together:  Not on file         Attends religious service:  Not on file         Active member of club or organization:  Not on file              Attends meetings of clubs or organizations:  Not on file              Relationship status:  Not on file        ?  Intimate partner violence              Fear of current or ex partner:  Not on file         Emotionally abused:  Not on file         Physically abused:  Not on file         Forced sexual activity:  Not on file        Other Topics  Concern         ?  Military Service  Not Asked     ?  Blood Transfusions  Not Asked     ?  Caffeine Concern  Not Asked     ?  Occupational Exposure  Not Asked     ?  Hobby Hazards  Not Asked     ?  Sleep Concern  Not Asked     ?  Stress Concern  Not Asked     ?  Weight Concern  Not Asked     ?  Special Diet  Not Asked     ?  Back Care  Not Asked     ?  Exercise  Not Asked     ?  Bike Helmet  Not Asked     ?  Seat Belt  Not Asked     ?  Self-Exams  Not Asked       Social History Narrative        ?  Not on file          Current Outpatient Medications          Medication  Sig  Dispense  Refill           ?  levothyroxine (SYNTHROID) 75 mcg tablet  Take 75 mcg by mouth Daily (before breakfast).         ?  pitavastatin calcium (LIVALO) 1 mg tab tablet  Take 1 Tab by mouth nightly.  90 Tab  3           ?  cholecalciferol (VITAMIN D3) (1000 Units /25 mcg) tablet  Take  by mouth daily.               OBJECTIVE:   Visit Vitals  BP  117/90   Comment: standing        Pulse  79     Temp  98.5 ??F (36.9 ??C) (Oral)     Resp  18         Ht  5' 3.5" (1.613 m)  Comment: taken w/o shoes        Wt  145 lb (65.8 kg)     SpO2  100%        BMI  25.28 kg/m??           Physical Exam:      Constitutional:  Well developed, well nourished female in no acute  distress, sitting comfortably on the examination table.         HEENT:  Normocephalic and atraumatic. Oropharynx is clear, mucous membranes are moist.  Sclerae anicteric. Neck supple without JVD. No thyromegaly present.         Lymph node     No palpable submandibular, cervical, supraclavicular lymph nodes.        Skin  Warm and dry.  No bruising and no rash noted.  No erythema.  No pallor.      Respiratory  Lungs are clear to auscultation bilaterally without wheezes, rales or rhonchi, normal air exchange without accessory muscle use.      CVS  Normal rate, regular rhythm and normal S1 and S2.  No murmurs, gallops, or rubs.     Abdomen  Soft, nontender and nondistended, normoactive bowel sounds.  No palpable mass.  No hepatosplenomegaly.     Neuro  Grossly nonfocal with no obvious sensory or motor deficits.     MSK  Normal range of motion in general.  No edema and no tenderness.     Psych  Appropriate mood and affect.         Labs:     Recent Results (from the past 24 hour(s))     CBC WITH AUTOMATED DIFF          Collection Time: 08/22/18 12:53 PM         Result  Value  Ref Range            WBC  6.5  4.3 - 11.1 K/uL       RBC  4.43  4.05 - 5.25 M/uL       HGB  13.3  11.7 - 15.4 g/dL       HCT  38.6  35.8 - 46.3 %       MCV  87.1  79.6 - 97.8 FL       MCH  30.0  26.1 - 32.9 PG       MCHC  34.5  31.4 - 35.0 g/dL       RDW  12.3  11.9 - 14.6 %       PLATELET  291  150 - 450 K/uL       MPV  10.4  9.4 - 12.3 FL       ABSOLUTE NRBC  0.00  0.0 - 0.2 K/uL       DF  AUTOMATED          NEUTROPHILS  77  43 - 78 %       LYMPHOCYTES  15  13 - 44 %       MONOCYTES  6  4.0 - 12.0 %       EOSINOPHILS  1  0.5 - 7.8 %       BASOPHILS   1  0.0 - 2.0 %       IMMATURE GRANULOCYTES  0  0.0 - 5.0 %       ABS. NEUTROPHILS  5.0  1.7 - 8.2 K/UL       ABS. LYMPHOCYTES  1.0  0.5 - 4.6 K/UL       ABS. MONOCYTES  0.4  0.1 - 1.3 K/UL       ABS. EOSINOPHILS  0.1  0.0 - 0.8 K/UL            ABS. BASOPHILS  0.0  0.0 - 0.2 K/UL            ABS. IMM. GRANS.  0.0  0.0 - 0.5 K/UL           Imaging:   MRI of the Breasts with and without contrast   ??   CLINICAL INDICATION:  New diagnosis of right 9:00 breast cancer status post   biopsy on 10/1/2019at [facility] demonstrating invasive ductal   carcinoma grade 2. Evaluate for extent of disease, staging, therapy planning. No   reported personal malignancy or breast surgery otherwise, and no family breast   or ovarian cancer.   ??   COMPARISON: Outside mammography and ultrasound from [facility]   03/13/2018, 03/07/2018, 02/27/2018   ??   TECHNIQUE: Standard MRI sequences were obtained through the breasts in multiple   planes. Images were obtained before and after intravenous infusion of 13 mL of   Dotarem contrast. Images were reviewed with PACS and with Dynacad CAD software.   ??   FINDINGS: The breasts demonstrate moderate bilateral glandularity, and moderate   bilateral background enhancement which could limit accuracy. There are   innumerable tiny enhancing foci and a few scattered small cysts on each side.   ??   Right lateral 9:00 biopsy site demonstrates a clip within an irregular   heterogeneously enhancing mass measuring up to 1.3 x 1.0 x 1.5 cm.   ??   There is no evidence of suspicious enhancing mass, and no dominant or unique   nonmass enhancement, to suggest additional malignancy in either breast.  There   is no evidence of axillary or internal mammary lymphadenopathy. Chest wall   signal is normal.    ??   ??   IMPRESSION   IMPRESSION:    1. Right 9:00 breast cancer measures up to 1.5 cm.   2. No additional discrete abnormality in either breast. Moderate background   enhancement.   3. No  evidence of lymphadenopathy or chest wall abnormalities.   ??   Recommend continued management as directed clinically. Annual bilateral   mammogram will be due in September 2020. This patient would likely benefit from   supplemental breast MRI in one year as well.   ??   BI-RADS Assessment Category 6: Known Biopsy Proven Malignancy   ??   ??   Pathology:                      ASSESSMENT:             ICD-10-CM  ICD-9-CM             1.  Malignant neoplasm of central portion of right breast in female, estrogen receptor positive (HCC)  C50.111  174.1  tamoxifen (NOLVADEX) 20 mg tablet            Z17.0  V86.0             Problem List  Date Reviewed:  08/22/2018                        Codes  Class  Noted             S/P lumpectomy of breast  ICD-10-CM: Z98.890   ICD-9-CM: V45.89    05/17/2018                       Malignant neoplasm of central portion of right breast in female, estrogen receptor positive ([place])  ICD-10-CM: C50.111, Z17.0   ICD-9-CM: 174.1, V86.0    04/12/2018                             PLAN:   Lab studies were personally reviewed.      Breast cancer: 1.6 cm, grade 2 IDC, sentinel lymph node biopsy negative, ER 9%, PR 95%, HER2 negative.  S/p lumpectomy with negative margins.  She has completed definitive surgery.  Because of the  ER/PR positivity, the negative nodal assessment, and the T1 tumor, she would be an excellent candidate for Oncotype Dx for risk stratification, which was low risk at 6.  Therefore, chemotherapy can be safely deferred.  We will refer her for radiation  therapy, then begin endocrine therapy.  She has completed hysterectomy but her ovaries are intact, her FSH/LH/E2 levels were borderline so we felt it would be prudent to proceed with tamoxifen for 2-3 years, then transition to AI once she is clearly post-menopausal.   We discussed side effects of tamoxifen including the risk of VTE, as well as vasomotor symptoms and arthralgias.  She understands and agrees to proceed after radiation is  completed.        Here for follow-up.  Doing well, she tolerated radiation very well.  She has a little bit of dermatitis left over, finished one week ago, but overall did quite well.  She is ready to start endocrine therapy.  Labs reviewed and unremarkable.  Rx for tamoxifen  sent to pharmacy, side effects again reviewed and she agrees to proceed.  All questions were asked and answered to the best of my ability.  F/u in about 3 months for recheck on endocrine therapy, then every 6 months thereafter.                     Greer Ee, MD   [facility] Hematology [place] [address]   Office : [phone]   Fax : [phone]

## 2018-08-22 NOTE — Progress Notes (Signed)
[facility]: Office Visit New Patient H & P    Chief Complaint:    Chief Complaint   Patient presents with   ??? Follow-up         History of Present Illness:  Jill Kaufman is a 53 y.o. female who presents today for evaluation regarding breast cancer.  She underwent routine mammogram in September 2019 which showed an irregular mass density in the upper outer right breast.  She underwent ultrasound and biopsy which showed invasive ductal carcinoma, grade 2, ER 95%, PR 95%, HER2 negative.  MRI showed the lesion to be 1.5 cm with no other abnormalities.  She was referred to Dr. Domenica Fail and underwent lumpectomy and sentinel node biopsy on 05/04/18 which showed 1.6 cm of tumor with negative margins and 1 sentinel lymph node negative for tumor.  Oncotype Dx testing was low risk at 6; therefore, chemotherapy can be safely deferred.  We will refer her for radiation therapy, then begin endocrine therapy.  She has completed hysterectomy but her ovaries are intact, her FSH/LH/E2 levels were borderline so we felt it would be prudent to proceed with tamoxifen for 2-3 years, then transition to AI once she is clearly post-menopausal.  We discussed side effects of tamoxifen including the risk of VTE, as well as vasomotor symptoms and arthralgias.  She understands and agrees to proceed after radiation is completed.    Here for follow-up.  Doing well, she tolerated radiation very well.  She has a little bit of dermatitis left over, finished one week ago, but overall did quite well.  She is ready to start endocrine therapy.                Review of Systems:  Constitutional: Negative.   HENT: Negative.   Eyes: Negative.   Respiratory: Negative.   Cardiovascular: Negative.   Gastrointestinal: Negative.   Genitourinary: Negative.   Musculoskeletal: Negative.   Skin: Negative.   Neurological: Negative.   Endo/Heme/Allergies: Negative.   Psychiatric/Behavioral: Negative.   All other systems reviewed and are negative.      Allergies   Allergen Reactions   ??? Codeine Vertigo     Past Medical History:   Diagnosis Date   ??? Breast cancer ([place])     T1cN0 right breast cancer   ??? GERD (gastroesophageal reflux disease)     OTC meds as needed    ??? High cholesterol    ??? Nausea & vomiting    ??? Thyroid disease 06/14/2011    Hypothyroidism      Past Surgical History:   Procedure Laterality Date   ??? HX BREAST BIOPSY Right 05/04/2018    RIGHT BREAST NEEDLE LOCALIZED BIOPSY performed by Lorenda Cahill, MD at [facility]   ??? HX BREAST LUMPECTOMY Right 05/04/2018    RIGHT BREAST LUMPECTOMY performed by Lorenda Cahill, MD at [place]   ??? HX GYN      hysterectomy partial-still has ovaries     Family History   Problem Relation Age of Onset   ??? Heart Attack Mother    ??? Cancer Father         throat cancer, skin cancer    ??? Diabetes Father    ??? Heart Attack Father      Social History     Socioeconomic History   ??? Marital status: MARRIED     Spouse name: Not on file   ??? Number of children: Not on file   ???  Years of education: Not on file   ??? Highest education level: Not on file   Occupational History   ??? Not on file   Social Needs   ??? Financial resource strain: Not on file   ??? Food insecurity     Worry: Not on file     Inability: Not on file   ??? Transportation needs     Medical: Not on file     Non-medical: Not on file   Tobacco Use   ??? Smoking status: Never Smoker   ??? Smokeless tobacco: Never Used   Substance and Sexual Activity   ??? Alcohol use: Yes     Comment: occassional   ??? Drug use: Not Currently   ??? Sexual activity: Not on file   Lifestyle   ??? Physical activity     Days per week: Not on file     Minutes per session: Not on file   ??? Stress: Not on file   Relationships   ??? Social Product manager on phone: Not on file     Gets together: Not on file     Attends religious service: Not on file     Active member of club or organization: Not on file     Attends meetings of clubs or organizations: Not on file      Relationship status: Not on file   ??? Intimate partner violence     Fear of current or ex partner: Not on file     Emotionally abused: Not on file     Physically abused: Not on file     Forced sexual activity: Not on file   Other Topics Concern   ??? Military Service Not Asked   ??? Blood Transfusions Not Asked   ??? Caffeine Concern Not Asked   ??? Occupational Exposure Not Asked   ??? Hobby Hazards Not Asked   ??? Sleep Concern Not Asked   ??? Stress Concern Not Asked   ??? Weight Concern Not Asked   ??? Special Diet Not Asked   ??? Back Care Not Asked   ??? Exercise Not Asked   ??? Bike Helmet Not Asked   ??? Seat Belt Not Asked   ??? Self-Exams Not Asked   Social History Narrative   ??? Not on file     Current Outpatient Medications   Medication Sig Dispense Refill   ??? levothyroxine (SYNTHROID) 75 mcg tablet Take 75 mcg by mouth Daily (before breakfast).     ??? pitavastatin calcium (LIVALO) 1 mg tab tablet Take 1 Tab by mouth nightly. 90 Tab 3   ??? cholecalciferol (VITAMIN D3) (1000 Units /25 mcg) tablet Take  by mouth daily.         OBJECTIVE:  Visit Vitals  BP 117/90 Comment: standing   Pulse 79   Temp 98.5 ??F (36.9 ??C) (Oral)   Resp 18   Ht 5' 3.5" (1.613 m) Comment: taken w/o shoes   Wt 145 lb (65.8 kg)   SpO2 100%   BMI 25.28 kg/m??       Physical Exam:  Constitutional: Well developed, well nourished female in no acute distress, sitting comfortably on the examination table.    HEENT: Normocephalic and atraumatic. Oropharynx is clear, mucous membranes are moist.  Sclerae anicteric. Neck supple without JVD. No thyromegaly present.    Lymph node   No palpable submandibular, cervical, supraclavicular lymph nodes.   Skin Warm and dry.  No bruising and no rash noted.  No erythema.  No pallor.    Respiratory Lungs are clear to auscultation bilaterally without wheezes, rales or rhonchi, normal air exchange without accessory muscle use.    CVS Normal rate, regular rhythm and normal S1 and S2.  No murmurs, gallops, or rubs.    Abdomen Soft, nontender and nondistended, normoactive bowel sounds.  No palpable mass.  No hepatosplenomegaly.   Neuro Grossly nonfocal with no obvious sensory or motor deficits.   MSK Normal range of motion in general.  No edema and no tenderness.   Psych Appropriate mood and affect.      Labs:  Recent Results (from the past 24 hour(s))   CBC WITH AUTOMATED DIFF    Collection Time: 08/22/18 12:53 PM   Result Value Ref Range    WBC 6.5 4.3 - 11.1 K/uL    RBC 4.43 4.05 - 5.25 M/uL    HGB 13.3 11.7 - 15.4 g/dL    HCT 38.6 35.8 - 46.3 %    MCV 87.1 79.6 - 97.8 FL    MCH 30.0 26.1 - 32.9 PG    MCHC 34.5 31.4 - 35.0 g/dL    RDW 12.3 11.9 - 14.6 %    PLATELET 291 150 - 450 K/uL    MPV 10.4 9.4 - 12.3 FL    ABSOLUTE NRBC 0.00 0.0 - 0.2 K/uL    DF AUTOMATED      NEUTROPHILS 77 43 - 78 %    LYMPHOCYTES 15 13 - 44 %    MONOCYTES 6 4.0 - 12.0 %    EOSINOPHILS 1 0.5 - 7.8 %    BASOPHILS 1 0.0 - 2.0 %    IMMATURE GRANULOCYTES 0 0.0 - 5.0 %    ABS. NEUTROPHILS 5.0 1.7 - 8.2 K/UL    ABS. LYMPHOCYTES 1.0 0.5 - 4.6 K/UL    ABS. MONOCYTES 0.4 0.1 - 1.3 K/UL    ABS. EOSINOPHILS 0.1 0.0 - 0.8 K/UL    ABS. BASOPHILS 0.0 0.0 - 0.2 K/UL    ABS. IMM. GRANS. 0.0 0.0 - 0.5 K/UL       Imaging:  MRI of the Breasts with and without contrast  ??  CLINICAL INDICATION:  New diagnosis of right 9:00 breast cancer status post  biopsy on 10/1/2019at [facility] demonstrating invasive ductal  carcinoma grade 2. Evaluate for extent of disease, staging, therapy planning. No  reported personal malignancy or breast surgery otherwise, and no family breast  or ovarian cancer.  ??  COMPARISON: Outside mammography and ultrasound from [facility]  03/13/2018, 03/07/2018, 02/27/2018  ??  TECHNIQUE: Standard MRI sequences were obtained through the breasts in multiple  planes. Images were obtained before and after intravenous infusion of 13 mL of  Dotarem contrast. Images were reviewed with PACS and with Dynacad CAD software.  ??   FINDINGS: The breasts demonstrate moderate bilateral glandularity, and moderate  bilateral background enhancement which could limit accuracy. There are  innumerable tiny enhancing foci and a few scattered small cysts on each side.  ??  Right lateral 9:00 biopsy site demonstrates a clip within an irregular  heterogeneously enhancing mass measuring up to 1.3 x 1.0 x 1.5 cm.  ??  There is no evidence of suspicious enhancing mass, and no dominant or unique  nonmass enhancement, to suggest additional malignancy in either breast.  There  is no evidence of axillary or internal mammary lymphadenopathy. Chest wall  signal is normal.   ??  ??  IMPRESSION  IMPRESSION:  1. Right 9:00 breast cancer measures up to 1.5 cm.  2. No additional discrete abnormality in either breast. Moderate background  enhancement.  3. No evidence of lymphadenopathy or chest wall abnormalities.  ??  Recommend continued management as directed clinically. Annual bilateral  mammogram will be due in September 2020. This patient would likely benefit from  supplemental breast MRI in one year as well.  ??  BI-RADS Assessment Category 6: Known Biopsy Proven Malignancy  ??  ??  Pathology:            ASSESSMENT:    ICD-10-CM ICD-9-CM    1. Malignant neoplasm of central portion of right breast in female, estrogen receptor positive (HCC) C50.111 174.1 tamoxifen (NOLVADEX) 20 mg tablet    Z17.0 V86.0      Problem List  Date Reviewed: 2018/09/03          Codes Class Noted    S/P lumpectomy of breast ICD-10-CM: Z98.890  ICD-9-CM: V45.89  05/17/2018        Malignant neoplasm of central portion of right breast in female, estrogen receptor positive ([place]) ICD-10-CM: C50.111, Z17.0  ICD-9-CM: 174.1, V86.0  04/12/2018                PLAN:  Lab studies were personally reviewed.    Breast cancer: 1.6 cm, grade 2 IDC, sentinel lymph node biopsy negative, ER 9%, PR 95%, HER2 negative.  S/p lumpectomy with negative margins.  She  has completed definitive surgery.  Because of the ER/PR positivity, the negative nodal assessment, and the T1 tumor, she would be an excellent candidate for Oncotype Dx for risk stratification, which was low risk at 6.  Therefore, chemotherapy can be safely deferred.  We will refer her for radiation therapy, then begin endocrine therapy.  She has completed hysterectomy but her ovaries are intact, her FSH/LH/E2 levels were borderline so we felt it would be prudent to proceed with tamoxifen for 2-3 years, then transition to AI once she is clearly post-menopausal.  We discussed side effects of tamoxifen including the risk of VTE, as well as vasomotor symptoms and arthralgias.  She understands and agrees to proceed after radiation is completed.      Here for follow-up.  Doing well, she tolerated radiation very well.  She has a little bit of dermatitis left over, finished one week ago, but overall did quite well.  She is ready to start endocrine therapy.  Labs reviewed and unremarkable.  Rx for tamoxifen sent to pharmacy, side effects again reviewed and she agrees to proceed.  All questions were asked and answered to the best of my ability.  F/u in about 3 months for recheck on endocrine therapy, then every 6 months thereafter.              Greer Ee, MD  [facility] [place] [place] [address]  Office : [phone]  Fax : [phone]

## 2018-08-22 NOTE — Patient Instructions (Addendum)
Patient Instructions from Today's Visit    Reason for Visit:  Breast cancer    Plan:  Start Tamoxifen as prescribed.    Follow Up:  As scheduled      -------------------------------------------------------------------------------------------------------------------  Please call our office at [phone] if you have any  of the following symptoms:   ?? Fever of 100.5 or greater  ?? Chills  ?? Shortness of breath  ?? Swelling or pain in one leg    After office hours an answering service is available and will contact a provider for emergencies or if you are experiencing any of the above symptoms.    ? Patient does express an interest in My Chart.  My Chart log in information explained on the after visit summary printout at the [facility] desk.    Barbera Setters, RN

## 2018-08-31 MED ORDER — LEVOTHYROXINE 75 MCG TAB
75 mcg | ORAL_TABLET | Freq: Every day | ORAL | 3 refills | Status: DC
Start: 2018-08-31 — End: 2019-08-07

## 2018-08-31 NOTE — Telephone Encounter (Signed)
Please send refill on levothyroxine to [facility]

## 2018-09-05 NOTE — Telephone Encounter (Signed)
Prior Auth and Appeal was denied for Livalo. Pt stated that she didn't not want to restart a statin due to intolerance of them in the past. She stated that she wanted to wait and see how her labs are in Aug and go from there.

## 2018-09-18 ENCOUNTER — Encounter

## 2018-09-18 NOTE — Progress Notes (Signed)
Patient: Jill Kaufman MRN: [id number]  SSN: [id number]    Date of Birth: September 07, 1965  Age: 53 y.o.  Sex: female        Jill Kaufman is a 53 y.o. female evaluated via tele health video visit via [email or web address] on  09/18/2018.      Consent:  She and/or health care decision maker is aware that that she may receive a bill for this telehealth service, depending on her insurance coverage, and has provided verbal consent to proceed: YES    I affirm this is a Patient Initiated Episode with an Established Patient who has not had a related appointment within my department in the past 7 days or scheduled within the next 24 hours.    Documentation:    PREVIOUS TREATMENT:    1) Lumpectomy and sentinel lymph node biopsy on 05/04/18  2) Radiation to right breast. Dose: 4256 cGy in 16 fractions right breast, 1000 cGy in 4 fractions right breast boost. Treatment dates: 07/18/2018 - 08/15/2018    Jill Kaufman is a 53 y.o. female who was seen by radiation oncology at the request of Dr. Domenica Fail.????She has a medical history significant for hypercholesterolemia, hypothyroidism, ??and GERD. She underwent routine screening mammogram in 09/19 that showed an irregular mass density in the upper outer right breast. ??She underwent ultrasound and biopsy that showed invasive ductal carcinoma, grade 2, ER 95%, PR 95%, HER-2 negative. ??MRI of the breasts showed the lesion to measure 1.5 cm with no other abnormalities. ????She was seen by Dr. Domenica Fail and underwent lumpectomy and sentinel lymph node biopsy on 05/04/18. ??Pathology demonstrated a 1.6 cm tumor with negative margins and 0/1 SLN involved. She was then seen by Dr. Jenetta Loges for discussion of systemic therapy. She had an Oncotype Dx score of 6 (low).????She was not recommended to undergo chemotherapy, but was recommended to consider endocrine therapy. ??Specifically, given her perimenopausal state, she is recommended to undergo 2 years of tamoxifen followed by an AI.    ??  She met with Dr. Maceo Pro in consultation  07/03/2018 to discuss the role of radiation therapy in further management of her Stage IA right breast cancer. The recommendation was for adjuvant radiation therapy to the breast as a standard portion of breast conservation therapy.     TREATMENT COURSE:  Jill Kaufman??did well without complaints with week 1 and 2. She had no complaints. On week 3, she had some mild skin reaction with a small amount of folliculitis. There were no changes with week 4. ??     INTERVAL HISTORY:    09/18/18: Virtual visit: Jill Kaufman is 1 month out from completing radiation therapy. She tolerated radiation therapy to the breast well. Skin issues have resolved. Reports some mild occasional breast tenderness mostly around the incision, otherwise no complaints. Good appetite. No swallowing issues. Started tamoxifen 1 month ago and tolerating fairly well. Reports some mild joint pain, otherwise no issues.   ??  OBSTETRIC HISTORY:   Menarche at the age of [age].   G2,P2.   Oral Contraceptive Pills: None   Hormone Replacement Therapy: None   PAST MEDICAL HISTORY:        Past Medical History:   Diagnosis Date   ??? Breast cancer ([place])     T1cN0 right breast cancer   ??? GERD (gastroesophageal reflux disease)     OTC meds as needed    ??? High cholesterol    ??? Nausea & vomiting    ??? Thyroid disease 06/14/2011    Hypothyroidism  The patient denies history of collagen vascular diseases, pacemaker insertion, prior radiation or prior chemotherapy.   PAST SURGICAL HISTORY:         Past Surgical History:   Procedure Laterality Date   ??? HX BREAST BIOPSY Right 05/04/2018    RIGHT BREAST NEEDLE LOCALIZED BIOPSY performed by Lorenda Cahill, MD at [facility]   ??? HX BREAST LUMPECTOMY Right 05/04/2018    RIGHT BREAST LUMPECTOMY performed by Lorenda Cahill, MD at [facility]   ??? HX GYN      hysterectomy     MEDICATIONS:     Current Outpatient Medications:   ??? cholecalciferol (VITAMIN D3) (1000 Units /25 mcg) tablet, Take by mouth daily., Disp: , Rfl:   ???  pitavastatin calcium (LIVALO PO), Take 1 mg by mouth nightly., Disp: , Rfl:   ??? levothyroxine (SYNTHROID) 75 mcg tablet, Take 75 mcg by mouth Daily (before breakfast)., Disp: , Rfl:   ALLERGIES:        Allergies   Allergen Reactions   ??? Codeine Vertigo     SOCIAL HISTORY:   Social History           Socioeconomic History   ??? Marital status: MARRIED     Spouse name: Not on file   ??? Number of children: Not on file   ??? Years of education: Not on file   ??? Highest education level: Not on file   Occupational History   ??? Not on file   Social Needs   ??? Financial resource strain: Not on file   ??? Food insecurity:     Worry: Not on file     Inability: Not on file   ??? Transportation needs:     Medical: Not on file     Non-medical: Not on file   Tobacco Use   ??? Smoking status: Never Smoker   ??? Smokeless tobacco: Never Used   Substance and Sexual Activity   ??? Alcohol use: Yes     Comment: occas   ??? Drug use: Not Currently   ??? Sexual activity: Not on file   Lifestyle   ??? Physical activity:     Days per week: Not on file     Minutes per session: Not on file   ??? Stress: Not on file   Relationships   ??? Social connections:     Talks on phone: Not on file     Gets together: Not on file     Attends religious service: Not on file     Active member of club or organization: Not on file     Attends meetings of clubs or organizations: Not on file     Relationship status: Not on file   ??? Intimate partner violence:     Fear of current or ex partner: Not on file     Emotionally abused: Not on file     Physically abused: Not on file     Forced sexual activity: Not on file   Other Topics Concern   ??? Military Service Not Asked   ??? Blood Transfusions Not Asked   ??? Caffeine Concern Not Asked   ??? Occupational Exposure Not Asked   ??? Hobby Hazards Not Asked   ??? Sleep Concern Not Asked   ??? Stress Concern Not Asked   ??? Weight Concern Not Asked   ??? Special Diet Not Asked   ??? Back Care Not Asked   ??? Exercise Not Asked   ???  Bike Helmet Not Asked   ??? Seat Belt  Not Asked   ??? Self-Exams Not Asked   Social History Narrative   ??? Not on file     FAMILY HISTORY:         Family History   Problem Relation Age of Onset   ??? Heart Disease Mother    ??? Cancer Father     eshophageal cancer, skin cancer    ??? Diabetes Father    ??? Heart Disease Father    ??? Stroke Father      REVIEW OF SYSTEMS: Please see the completed review of systems sheet in the chart that I have reviewed today.     PHYSICAL EXAMINATION:   ECOG Performance status 0     VITAL SIGNS: Not done due to virtual visit      GENERAL: The patient is well-developed, ambulatory, alert and in no acute distress. Breast exam deferred at this time    PATHOLOGY:   05/04/18:   DIAGNOSIS   A: ???RIGHT BREAST???: IN SITU AND INFILTRATING DUCTAL CARCINOMA, INTERMEDIATE GRADE. NO TUMOR IS IDENTIFIED AT INKED MARGINS OF RESECTION. SEE CAP SYNOPTIC REPORT BELOW.   PROCEDURE: Excision, needle localization lumpectomy.   SPECIMEN LATERALITY: Right.   TUMOR SIZE: 1.6 centimeters.   HISTOLOGIC TYPE: Infiltrating ductal carcinoma.   HISTOLOGIC GRADE: Intermediate grade.   GLANDULAR (ACINAR/TUBULAR DIFFERENTIATION): Score 2.   NUCLEAR PLEOMORPHISM: Score 2.   MITOTIC RATE: Score 2.   OVERALL GRADE: Grade 2 (scores of 6 or 7).   TUMOR FOCALITY: Single focus of invasive carcinoma.   DUCTAL CARCINOMA IN SITU (DCIS): Present.   ARCHITECTURAL PATTERNS OF DCIS: Cribriform.   HISTOLOGIC GRADE OF DCIS: Grade 2 (intermediate grade).   NECROSIS ASSOCIATED WITH DCIS: Not identified.   MARGINS: No carcinoma identified at inked margins of resection.   CLOSEST SPECIMEN MARGIN OF RESECTION: Superior margin (2 millimeters).   See additionally submitted superior margin in part C.   DCIS MARGINS: No DCIS identified at margins of resection   MICROCALCIFICATIONS: Present.   REGIONAL LYMPH NODES (SEE ALSO PART E):   NUMBER OF LYMPH NODES EXAMINED: 1   NUMBER OF LYMPH NODES INVOLVED BY METASTATIC CARCINOMA: 0   LYMPHOVASCULAR INVASION: Not identified.   PATHOLOGIC STAGE  CLASSIFICATION: pT1c N0 (sn) ([facility], 8TH EDITION, 2017).   BREAST BIOMARKER TESTING: Previously performed per [facility] (Estrogen receptor positive, progesterone receptor positive, Her-2/neu negative. Previous pathology report not available ).   B: ???RIGHT BREAST DEEP MEDIAL MARGIN???: NO CARCINOMA IDENTIFIED.   C: ???RIGHT BREAST SUPERIOR MARGIN???: NO CARCINOMA IDENTIFIED.   D: ???RIGHT BREAST INFERIOR MARGIN???: NO CARCINOMA IDENTIFIED.   E: ???RIGHT SENTINEL NODE BIOPSY???: LYMPH NODE TISSUE, NO CARCINOMA IDENTIFIED. SEE SUMMARY STAGING ABOVE.   Procedures/Addenda   STF - ONCOTYPE DX ASSAY   Status: Signed Out Noel A. Ennis Forts, M.D., Ph.D. on 05/25/2018   Interpretation   [facility] Oncotype DX Breast Recurrence Score?? Result: 6.     LABORATORY: Not done today    RADIOLOGY:     03/26/18: MRI of the Breasts with and without contrast   CLINICAL INDICATION: New diagnosis of right 9:00 breast cancer status post   biopsy on 10/1/2019at [facility] demonstrating invasive ductal   carcinoma grade 2. Evaluate for extent of disease, staging, therapy planning. No   reported personal malignancy or breast surgery otherwise, and no family breast   or ovarian cancer.   COMPARISON: Outside mammography and ultrasound from [facility]   03/13/2018, 03/07/2018,  02/27/2018   TECHNIQUE: Standard MRI sequences were obtained through the breasts in multiple   planes. Images were obtained before and after intravenous infusion of 13 mL of   Dotarem contrast. Images were reviewed with PACS and with Dynacad CAD software.   FINDINGS: The breasts demonstrate moderate bilateral glandularity, and moderate   bilateral background enhancement which could limit accuracy. There are   innumerable tiny enhancing foci and a few scattered small cysts on each side.   Right lateral 9:00 biopsy site demonstrates a clip within an irregular   heterogeneously enhancing mass measuring up to 1.3 x 1.0 x 1.5 cm.   There is no evidence of  suspicious enhancing mass, and no dominant or unique   nonmass enhancement, to suggest additional malignancy in either breast. There   is no evidence of axillary or internal mammary lymphadenopathy. Chest wall   signal is normal.   IMPRESSION:   1. Right 9:00 breast cancer measures up to 1.5 cm.   2. No additional discrete abnormality in either breast. Moderate background   enhancement.   3. No evidence of lymphadenopathy or chest wall abnormalities.   Recommend continued management as directed clinically. Annual bilateral   mammogram will be due in September 2020. This patient would likely benefit from   supplemental breast MRI in one year as well.   BI-RADS Assessment Category 6: Known Biopsy Proven Malignancy         IMPRESSION:  Jill Kaufman is a 53 y.o. female with invasive ductal carcinoma of the right breast, ER/PR positive, HER-2 negative, grade 2, pT1c pN0, Stage IA. Lumpectomy and sentinel lymph node biopsy on 05/04/18 followed by adjuvant radiation therapy 07/18/2018 - 08/15/2018. She was then started on Tamoxifen under the direction of Dr. Jenetta Loges, medical onocology  ??    PLAN:  1) Jill Yaw is 1 month out from completing radiation therapy. She is recovering as anticipated from treatment. We discussed side effects related to radiation of right breast. We discussed symptom management - continue to lotion  2) Encouraged SBE  3) RTC 3 months to assess side effects related to recent radiation therapy  4) Will order her mammogram to be done in October  5) Tamoxifen managed by medical oncology  6) Coordinate care with other providers      Total Time: 18 minutes    Pursuant to the emergency declaration under the [place], 1135 waiver authority and the [employer] Act, this Virtual  Visit was conducted, with patient's consent, to reduce the patient's risk of exposure to COVID-19 and provide continuity of care for an established  patient.     Services were provided through a video synchronous with [email or web address] discussion virtually to substitute for in-person clinic visit.    Romero Liner, NP     Portions of this note were copied from prior encounters and reviewed for accuracy, currency, and represent documentation and tasks completed during this encounter. I verify and attest these portions to be unchanged from prior visits.

## 2018-09-18 NOTE — Progress Notes (Addendum)
Patient: Jill Kaufman MRN: [id number]  SSN: [id number]    Date of Birth: [birth year]  Age: 53 y.o.  Sex: female        Jill Kaufman is a 53 y.o. female evaluated via tele health video visit via [email or web address] on  09/18/2018.      Consent:  She and/or health care decision maker is aware that that she may receive a bill for this telehealth service, depending on her insurance coverage, and has provided verbal consent to proceed: YES    I affirm this is a Patient Initiated Episode with an Established Patient who has not had a related appointment within my department in the past 7 days or scheduled within the next 24 hours.    Documentation:    PREVIOUS TREATMENT:    1) Lumpectomy and sentinel lymph node biopsy on 05/04/18  2) Radiation to right breast. Dose: 4256 cGy in 16 fractions right breast, 1000 cGy in 4 fractions right breast boost. Treatment dates: 07/18/2018 - 08/15/2018    Jill Kaufman is a 53 y.o. female who was seen by radiation oncology at the request of Dr. Domenica Fail.????She has a medical history significant for hypercholesterolemia, hypothyroidism, ??and GERD. She underwent routine screening mammogram in 09/19 that showed an irregular mass density in the upper outer right breast. ??She underwent ultrasound and biopsy that showed invasive ductal carcinoma, grade 2, ER 95%, PR 95%, HER-2 negative. ??MRI of the breasts showed the lesion to measure 1.5 cm with no other abnormalities. ????She was seen by Dr. Domenica Fail and underwent lumpectomy and sentinel lymph node biopsy on 05/04/18. ??Pathology demonstrated a 1.6 cm tumor with negative margins and 0/1 SLN involved. She was then seen by Dr. Jenetta Loges for discussion of systemic therapy. She had an Oncotype Dx score of 6 (low).????She was not recommended to undergo chemotherapy, but was recommended to consider endocrine therapy. ??Specifically, given her perimenopausal state, she is recommended to undergo 2 years of tamoxifen followed by an AI.    ??   She met with Dr. Maceo Pro in consultation 07/03/2018 to discuss the role of radiation therapy in further management of her Stage IA right breast cancer. The recommendation was for adjuvant radiation therapy to the breast as a standard portion of breast conservation therapy.     TREATMENT COURSE:  Jill Kaufman??did well without complaints with week 1 and 2. She had no complaints. On week 3, she had some mild skin reaction with a small amount of folliculitis. There were no changes with week 4. ??     INTERVAL HISTORY:    09/18/18: Virtual visit: Jill Kaufman is 1 month out from completing radiation therapy. She tolerated radiation therapy to the breast well. Skin issues have resolved. Reports some mild occasional breast tenderness mostly around the incision, otherwise no complaints. Good appetite. No swallowing issues. Started tamoxifen 1 month ago and tolerating fairly well. Reports some mild joint pain, otherwise no issues.   ??  OBSTETRIC HISTORY:   Menarche at the age of 9-13.   G2,P2.   Oral Contraceptive Pills: None   Hormone Replacement Therapy: None   PAST MEDICAL HISTORY:        Past Medical History:   Diagnosis Date   ??? Breast cancer ([place])     T1cN0 right breast cancer   ??? GERD (gastroesophageal reflux disease)     OTC meds as needed    ??? High cholesterol    ??? Nausea & vomiting    ??? Thyroid disease 06/14/2011    Hypothyroidism  The patient denies history of collagen vascular diseases, pacemaker insertion, prior radiation or prior chemotherapy.   PAST SURGICAL HISTORY:         Past Surgical History:   Procedure Laterality Date   ??? HX BREAST BIOPSY Right 05/04/2018    RIGHT BREAST NEEDLE LOCALIZED BIOPSY performed by Lorenda Cahill, MD at [facility] [facility]   ??? HX BREAST LUMPECTOMY Right 05/04/2018    RIGHT BREAST LUMPECTOMY performed by Lorenda Cahill, MD at [facility]   ??? HX GYN      hysterectomy     MEDICATIONS:     Current Outpatient Medications:    ??? cholecalciferol (VITAMIN D3) (1000 Units /25 mcg) tablet, Take by mouth daily., Disp: , Rfl:   ??? pitavastatin calcium (LIVALO PO), Take 1 mg by mouth nightly., Disp: , Rfl:   ??? levothyroxine (SYNTHROID) 75 mcg tablet, Take 75 mcg by mouth Daily (before breakfast)., Disp: , Rfl:   ALLERGIES:        Allergies   Allergen Reactions   ??? Codeine Vertigo     SOCIAL HISTORY:   Social History           Socioeconomic History   ??? Marital status: MARRIED     Spouse name: Not on file   ??? Number of children: Not on file   ??? Years of education: Not on file   ??? Highest education level: Not on file   Occupational History   ??? Not on file   Social Needs   ??? Financial resource strain: Not on file   ??? Food insecurity:     Worry: Not on file     Inability: Not on file   ??? Transportation needs:     Medical: Not on file     Non-medical: Not on file   Tobacco Use   ??? Smoking status: Never Smoker   ??? Smokeless tobacco: Never Used   Substance and Sexual Activity   ??? Alcohol use: Yes     Comment: occas   ??? Drug use: Not Currently   ??? Sexual activity: Not on file   Lifestyle   ??? Physical activity:     Days per week: Not on file     Minutes per session: Not on file   ??? Stress: Not on file   Relationships   ??? Social connections:     Talks on phone: Not on file     Gets together: Not on file     Attends religious service: Not on file     Active member of club or organization: Not on file     Attends meetings of clubs or organizations: Not on file     Relationship status: Not on file   ??? Intimate partner violence:     Fear of current or ex partner: Not on file     Emotionally abused: Not on file     Physically abused: Not on file     Forced sexual activity: Not on file   Other Topics Concern   ??? Military Service Not Asked   ??? Blood Transfusions Not Asked   ??? Caffeine Concern Not Asked   ??? Occupational Exposure Not Asked   ??? Hobby Hazards Not Asked   ??? Sleep Concern Not Asked   ??? Stress Concern Not Asked   ??? Weight Concern Not Asked    ??? Special Diet Not Asked   ??? Back Care Not Asked   ??? Exercise Not Asked   ???  Bike Helmet Not Asked   ??? Seat Belt Not Asked   ??? Self-Exams Not Asked   Social History Narrative   ??? Not on file     FAMILY HISTORY:         Family History   Problem Relation Age of Onset   ??? Heart Disease Mother    ??? Cancer Father     eshophageal cancer, skin cancer    ??? Diabetes Father    ??? Heart Disease Father    ??? Stroke Father      REVIEW OF SYSTEMS: Please see the completed review of systems sheet in the chart that I have reviewed today.     PHYSICAL EXAMINATION:   ECOG Performance status 0     VITAL SIGNS: Not done due to virtual visit      GENERAL: The patient is well-developed, ambulatory, alert and in no acute distress. Breast exam deferred at this time    PATHOLOGY:   05/04/18:   DIAGNOSIS   A: ???RIGHT BREAST???: IN SITU AND INFILTRATING DUCTAL CARCINOMA, INTERMEDIATE GRADE. NO TUMOR IS IDENTIFIED AT INKED MARGINS OF RESECTION. SEE CAP SYNOPTIC REPORT BELOW.   PROCEDURE: Excision, needle localization lumpectomy.   SPECIMEN LATERALITY: Right.   TUMOR SIZE: 1.6 centimeters.   HISTOLOGIC TYPE: Infiltrating ductal carcinoma.   HISTOLOGIC GRADE: Intermediate grade.   GLANDULAR (ACINAR/TUBULAR DIFFERENTIATION): Score 2.   NUCLEAR PLEOMORPHISM: Score 2.   MITOTIC RATE: Score 2.   OVERALL GRADE: Grade 2 (scores of 6 or 7).   TUMOR FOCALITY: Single focus of invasive carcinoma.   DUCTAL CARCINOMA IN SITU (DCIS): Present.   ARCHITECTURAL PATTERNS OF DCIS: Cribriform.   HISTOLOGIC GRADE OF DCIS: Grade 2 (intermediate grade).   NECROSIS ASSOCIATED WITH DCIS: Not identified.   MARGINS: No carcinoma identified at inked margins of resection.   CLOSEST SPECIMEN MARGIN OF RESECTION: Superior margin (2 millimeters).   See additionally submitted superior margin in part C.   DCIS MARGINS: No DCIS identified at margins of resection   MICROCALCIFICATIONS: Present.   REGIONAL LYMPH NODES (SEE ALSO PART E):   NUMBER OF LYMPH NODES EXAMINED: 1    NUMBER OF LYMPH NODES INVOLVED BY METASTATIC CARCINOMA: 0   LYMPHOVASCULAR INVASION: Not identified.   PATHOLOGIC STAGE CLASSIFICATION: pT1c N0 (sn) ([facility], 8TH EDITION, 2017).   BREAST BIOMARKER TESTING: Previously performed per [facility] (Estrogen receptor positive, progesterone receptor positive, Her-2/neu negative. Previous pathology report not available ).   B: ???RIGHT BREAST DEEP MEDIAL MARGIN???: NO CARCINOMA IDENTIFIED.   C: ???RIGHT BREAST SUPERIOR MARGIN???: NO CARCINOMA IDENTIFIED.   D: ???RIGHT BREAST INFERIOR MARGIN???: NO CARCINOMA IDENTIFIED.   E: ???RIGHT SENTINEL NODE BIOPSY???: LYMPH NODE TISSUE, NO CARCINOMA IDENTIFIED. SEE SUMMARY STAGING ABOVE.   Procedures/Addenda   STF - ONCOTYPE DX ASSAY   Status: Signed Out Noel A. Ennis Forts, M.D., Ph.D. on 05/25/2018   Interpretation   [facility] Oncotype DX Breast Recurrence Score?? Result: 6.     LABORATORY: Not done today    RADIOLOGY:     03/26/18: MRI of the Breasts with and without contrast   CLINICAL INDICATION: New diagnosis of right 9:00 breast cancer status post   biopsy on 10/1/2019at [facility] demonstrating invasive ductal   carcinoma grade 2. Evaluate for extent of disease, staging, therapy planning. No   reported personal malignancy or breast surgery otherwise, and no family breast   or ovarian cancer.   COMPARISON: Outside mammography and ultrasound from [facility]   03/13/2018, 03/07/2018, 02/27/2018  TECHNIQUE: Standard MRI sequences were obtained through the breasts in multiple   planes. Images were obtained before and after intravenous infusion of 13 mL of   Dotarem contrast. Images were reviewed with PACS and with Dynacad CAD software.   FINDINGS: The breasts demonstrate moderate bilateral glandularity, and moderate   bilateral background enhancement which could limit accuracy. There are   innumerable tiny enhancing foci and a few scattered small cysts on each side.    Right lateral 9:00 biopsy site demonstrates a clip within an irregular   heterogeneously enhancing mass measuring up to 1.3 x 1.0 x 1.5 cm.   There is no evidence of suspicious enhancing mass, and no dominant or unique   nonmass enhancement, to suggest additional malignancy in either breast. There   is no evidence of axillary or internal mammary lymphadenopathy. Chest wall   signal is normal.   IMPRESSION:   1. Right 9:00 breast cancer measures up to 1.5 cm.   2. No additional discrete abnormality in either breast. Moderate background   enhancement.   3. No evidence of lymphadenopathy or chest wall abnormalities.   Recommend continued management as directed clinically. Annual bilateral   mammogram will be due in September 2020. This patient would likely benefit from   supplemental breast MRI in one year as well.   BI-RADS Assessment Category 6: Known Biopsy Proven Malignancy         IMPRESSION:  Jill Kaufman is a 53 y.o. female with invasive ductal carcinoma of the right breast, ER/PR positive, HER-2 negative, grade 2, pT1c pN0, Stage IA. Lumpectomy and sentinel lymph node biopsy on 05/04/18 followed by adjuvant radiation therapy 07/18/2018 - 08/15/2018. She was then started on Tamoxifen under the direction of Dr. Jenetta Loges, medical onocology  ??    PLAN:  1) Jill Kaufman is 1 month out from completing radiation therapy. She is recovering as anticipated from treatment. We discussed side effects related to radiation of right breast. We discussed symptom management - continue to lotion  2) Encouraged SBE  3) RTC 3 months to assess side effects related to recent radiation therapy  4) Will order her mammogram to be done in October  5) Tamoxifen managed by medical oncology  6) Coordinate care with other providers      Total Time: 18 minutes    Pursuant to the emergency declaration under the [place], 1135 waiver authority and the [employer] Act, this Virtual  Visit was conducted, with patient's consent, to reduce the patient's risk of exposure to COVID-19 and provide continuity of care for an established patient.     Services were provided through a video synchronous with [email or web address] discussion virtually to substitute for in-person clinic visit.    Romero Liner, NP     Portions of this note were copied from prior encounters and reviewed for accuracy, currency, and represent documentation and tasks completed during this encounter. I verify and attest these portions to be unchanged from prior visits.

## 2018-09-19 ENCOUNTER — Inpatient Hospital Stay: Payer: PRIVATE HEALTH INSURANCE | Attending: Nurse Practitioner | Primary: Family Medicine

## 2018-09-25 MED ORDER — LOVASTATIN 10 MG TAB
10 mg | ORAL_TABLET | Freq: Every evening | ORAL | 3 refills | Status: DC
Start: 2018-09-25 — End: 2019-02-20

## 2018-09-26 NOTE — Telephone Encounter (Signed)
-----   Message from Elvis Coil, MD sent at 09/25/2018  5:13 PM EDT -----  Regarding: RE: Start Medication  Okay.  We will try her on Mevacor.  This is been called in.  ----- Message -----  From: Jake Seats  Sent: 09/25/2018   4:38 PM EDT  To: Elvis Coil, MD  Subject: Start Medication                                 Pt's Livalo 1 mg was denied. A few weeks ago she decided that she didn't want to start a statin. Pt called back today stating that she wants to now start a medication. She has tried Rosuvastatin, Pravastatin, and Atorvastatin in the past. Pt stated that they all caused leg pain.

## 2018-09-26 NOTE — Telephone Encounter (Signed)
Pt was called and informed that she was started on Mevacor and that medication was called into the listed preferred pharmacy.

## 2018-10-26 ENCOUNTER — Encounter

## 2018-10-31 ENCOUNTER — Inpatient Hospital Stay: Admit: 2018-10-31 | Payer: PRIVATE HEALTH INSURANCE | Primary: Family Medicine

## 2018-10-31 ENCOUNTER — Encounter: Attending: Internal Medicine | Primary: Family Medicine

## 2018-10-31 DIAGNOSIS — C50111 Malignant neoplasm of central portion of right female breast: Secondary | ICD-10-CM

## 2018-10-31 LAB — METABOLIC PANEL, COMPREHENSIVE
A-G Ratio: 1 — ABNORMAL LOW (ref 1.2–3.5)
ALT (SGPT): 25 U/L (ref 12–65)
AST (SGOT): 15 U/L (ref 15–37)
Albumin: 4 g/dL (ref 3.5–5.0)
Alk. phosphatase: 68 U/L (ref 50–136)
Anion gap: 7 mmol/L (ref 7–16)
BUN: 17 MG/DL (ref 6–23)
Bilirubin, total: 0.4 MG/DL (ref 0.2–1.1)
CO2: 25 mmol/L (ref 21–32)
Calcium: 9 MG/DL (ref 8.3–10.4)
Chloride: 106 mmol/L (ref 98–107)
Creatinine: 1 MG/DL (ref 0.6–1.0)
GFR est AA: >60 mL/min/{1.73_m2} (ref >60)
GFR est non-AA: >60 mL/min/{1.73_m2} (ref >60)
Globulin: 4 g/dL — ABNORMAL HIGH (ref 2.3–3.5)
Glucose: 104 mg/dL — ABNORMAL HIGH (ref 65–100)
Potassium: 3.9 mmol/L (ref 3.5–5.1)
Protein, total: 8 g/dL (ref 6.3–8.2)
Sodium: 138 mmol/L (ref 136–145)

## 2018-10-31 LAB — CBC WITH AUTOMATED DIFF
ABS. BASOPHILS: 0.1 10*3/uL (ref 0.0–0.2)
ABS. EOSINOPHILS: 0.1 10*3/uL (ref 0.0–0.8)
ABS. IMM. GRANS.: 0 10*3/uL (ref 0.0–0.5)
ABS. LYMPHOCYTES: 1.4 10*3/uL (ref 0.5–4.6)
ABS. MONOCYTES: 0.4 10*3/uL (ref 0.1–1.3)
ABS. NEUTROPHILS: 5.6 10*3/uL (ref 1.7–8.2)
ABSOLUTE NRBC: 0 10*3/uL (ref 0.0–0.2)
BASOPHILS: 1 % (ref 0.0–2.0)
EOSINOPHILS: 1 % (ref 0.5–7.8)
HCT: 41 % (ref 35.8–46.3)
HGB: 14 g/dL (ref 11.7–15.4)
IMMATURE GRANULOCYTES: 0 % (ref 0.0–5.0)
LYMPHOCYTES: 19 % (ref 13–44)
MCH: 29.7 PG (ref 26.1–32.9)
MCHC: 34.1 g/dL (ref 31.4–35.0)
MCV: 87 FL (ref 79.6–97.8)
MONOCYTES: 5 % (ref 4.0–12.0)
MPV: 10.6 FL (ref 9.4–12.3)
NEUTROPHILS: 74 % (ref 43–78)
PLATELET: 239 10*3/uL (ref 150–450)
RBC: 4.71 M/uL (ref 4.05–5.25)
RDW: 12.6 % (ref 11.9–14.6)
WBC: 7.6 10*3/uL (ref 4.3–11.1)

## 2018-10-31 LAB — CBC WITH AUTO DIFFERENTIAL
Basophils %: 1 % (ref 0.0–2.0)
Basophils Absolute: 0.1 10*3/uL (ref 0.0–0.2)
Eosinophils %: 1 % (ref 0.5–7.8)
Eosinophils Absolute: 0.1 10*3/uL (ref 0.0–0.8)
Granulocyte Absolute Count: 0 10*3/uL (ref 0.0–0.5)
Hematocrit: 41 % (ref 35.8–46.3)
Hemoglobin: 14 g/dL (ref 11.7–15.4)
Immature Granulocytes: 0 % (ref 0.0–5.0)
Lymphocytes %: 19 % (ref 13–44)
Lymphocytes Absolute: 1.4 10*3/uL (ref 0.5–4.6)
MCH: 29.7 PG (ref 26.1–32.9)
MCHC: 34.1 g/dL (ref 31.4–35.0)
MCV: 87 FL (ref 79.6–97.8)
MPV: 10.6 FL (ref 9.4–12.3)
Monocytes %: 5 % (ref 4.0–12.0)
Monocytes Absolute: 0.4 10*3/uL (ref 0.1–1.3)
NRBC Absolute: 0 10*3/uL (ref 0.0–0.2)
Neutrophils %: 74 % (ref 43–78)
Neutrophils Absolute: 5.6 10*3/uL (ref 1.7–8.2)
Platelets: 239 10*3/uL (ref 150–450)
RBC: 4.71 M/uL (ref 4.05–5.25)
RDW: 12.6 % (ref 11.9–14.6)
WBC: 7.6 10*3/uL (ref 4.3–11.1)

## 2018-10-31 LAB — COMPREHENSIVE METABOLIC PANEL
ALT: 25 U/L (ref 12–65)
AST: 15 U/L (ref 15–37)
Albumin/Globulin Ratio: 1 — ABNORMAL LOW (ref 1.2–3.5)
Albumin: 4 g/dL (ref 3.5–5.0)
Alkaline Phosphatase: 68 U/L (ref 50–136)
Anion Gap: 7 mmol/L (ref 7–16)
BUN: 17 MG/DL (ref 6–23)
CO2: 25 mmol/L (ref 21–32)
Calcium: 9 MG/DL (ref 8.3–10.4)
Chloride: 106 mmol/L (ref 98–107)
Creatinine: 1 MG/DL (ref 0.6–1.0)
EGFR IF NonAfrican American: >60 mL/min/{1.73_m2} (ref >60)
GFR African American: >60 mL/min/{1.73_m2} (ref >60)
Globulin: 4 g/dL — ABNORMAL HIGH (ref 2.3–3.5)
Glucose: 104 mg/dL — ABNORMAL HIGH (ref 65–100)
Potassium: 3.9 mmol/L (ref 3.5–5.1)
Sodium: 138 mmol/L (ref 136–145)
Total Bilirubin: 0.4 MG/DL (ref 0.2–1.1)
Total Protein: 8 g/dL (ref 6.3–8.2)

## 2018-11-01 ENCOUNTER — Telehealth: Attending: Hematology & Oncology | Primary: Family Medicine

## 2018-11-01 ENCOUNTER — Telehealth
Admit: 2018-11-01 | Discharge: 2018-11-01 | Payer: PRIVATE HEALTH INSURANCE | Attending: Hematology & Oncology | Primary: Family Medicine

## 2018-11-01 DIAGNOSIS — C50111 Malignant neoplasm of central portion of right female breast: Secondary | ICD-10-CM

## 2018-11-01 DIAGNOSIS — Z170 Estrogen receptor positive status [ER+]: Secondary | ICD-10-CM

## 2018-11-01 MED ORDER — TAMOXIFEN 20 MG TAB
20 mg | ORAL_TABLET | Freq: Every day | ORAL | 3 refills | Status: DC
Start: 2018-11-01 — End: 2019-11-01
  Filled 2018-11-14: qty 90, 90d supply, fill #0

## 2018-11-01 NOTE — Progress Notes (Signed)
Progress Notes by Lurene Shadow, MD at 11/01/18 1500                Author: Lurene Shadow, MD  Service: --  Author Type: Physician       Filed: 11/01/18 1657  Encounter Date: 11/01/2018  Status: Signed          Editor: Lurene Shadow, MD (Physician)               Consent: Jill Kaufman was seen by synchronous (real-time) audio-video technology, and/or her healthcare decision maker, is aware that this patient-initiated, Telehealth encounter on  11/01/2018 is a billable service, with coverage as determined by her insurance carrier. She is aware that she may receive a bill and has provided verbal consent to proceed: Yes. Services were provided through video using [email or web address] discussing virtually  to substitute for in-person clinic visit.        Total Time: 15-20 minutes.      Pursuant to the emergency declaration under the [place], 1135 waiver authority and the [employer]  Act, this Virtual  Visit was conducted, with patient's (and/or legal guardian's) consent, to reduce the patient's risk of exposure to COVID-19 and provide necessary medical care.o synchronous discussion virtually to substitute for in-person clinic visit.   Patient  and provider were located at their individual homes.      [facility]: Office Visit New Patient H & P      Chief Complaint:       Chief Complaint       Patient presents with        ?  Follow-up        ?  Breast Cancer        History of Present Illness:   Jill Kaufman is a  53 y.o. female who presents today for evaluation regarding breast cancer.  She underwent  routine mammogram in September 2019 which showed an irregular mass density in the upper outer right breast.  She underwent ultrasound and biopsy which showed invasive ductal carcinoma, grade 2, ER 95%, PR 95%, HER2 negative.  MRI showed the lesion to  be 1.5 cm with no other abnormalities.  She was referred to Dr.  Domenica Fail and underwent lumpectomy and sentinel node biopsy on 05/04/18 which showed 1.6 cm of tumor with negative margins and 1 sentinel lymph node negative for tumor.  Oncotype Dx testing  was low risk at 6; therefore, chemotherapy can be safely deferred.  We will refer her for radiation therapy, then begin endocrine therapy.  She has completed hysterectomy but her ovaries are intact,  her FSH/LH/E2 levels were borderline so we felt it would be prudent to proceed with tamoxifen for 2-3 years, then transition to AI once she is clearly post-menopausal.  We discussed side effects of  tamoxifen including the risk of VTE, as well as vasomotor symptoms and arthralgias.  She understands and agrees to proceed after radiation is completed.      Here for follow-up via doxy.me video conference.  On Tamoxifen and has been tolerating it relatively well.  Hot flashes have subsided.  In last 2-3 weeks, she noted worsening pains in her left breast.  The pain was sharp and initially recurrent/few times  a day.  It was severe enough that it'd take her breath away.  Not positional.  She felt no difference in quality or severity of pain despite switching out bra/different support.  This  pain has been easing up a little and she feels the breast is now sore  more than painful.  She has been doing self examinations and noted nodules in her breast tail/axilla.  One area feels more nodular.  She would like this examined.  She has no s/s of infection and has been practicing social distancing/COVID-19 precautions.   No swelling in her arm or hand.        Review of Systems:   Constitutional: Negative.    HENT: Negative.    Eyes: Negative.    Respiratory: Negative.    Cardiovascular: Negative.    Gastrointestinal: Negative.    Genitourinary: Negative.    Musculoskeletal: Negative.    Skin: Negative.    Neurological: Negative.    Endo/Heme/Allergies: Negative.    Psychiatric/Behavioral: Negative.    All other systems reviewed and are  negative.         Allergies        Allergen  Reactions         ?  Codeine  Vertigo          Past Medical History:        Diagnosis  Date         ?  Breast cancer ([place])            T1cN0 right breast cancer         ?  GERD (gastroesophageal reflux disease)            OTC meds as needed          ?  High cholesterol       ?  Nausea & vomiting       ?  Thyroid disease  06/14/2011          Hypothyroidism           Past Surgical History:         Procedure  Laterality  Date          ?  HX BREAST BIOPSY  Right  05/04/2018          RIGHT BREAST NEEDLE LOCALIZED BIOPSY performed by Lorenda Cahill, MD at [place]          ?  HX BREAST LUMPECTOMY  Right  05/04/2018          RIGHT BREAST LUMPECTOMY performed by Lorenda Cahill, MD at [place]          ?  HX GYN              hysterectomy partial-still has ovaries          Family History         Problem  Relation  Age of Onset          ?  Heart Attack  Mother       ?  Cancer  Father                throat cancer, skin cancer           ?  Diabetes  Father            ?  Heart Attack  Father            Social History          Socioeconomic History         ?  Marital status:  MARRIED              Spouse name:  Not on file         ?  Number of children:  Not on file     ?  Years of education:  Not on file     ?  Highest education level:  Not on file       Occupational History        ?  Not on file       Social Needs         ?  Financial resource strain:  Not on file        ?  Food insecurity              Worry:  Not on file         Inability:  Not on file        ?  Transportation needs              Medical:  Not on file         Non-medical:  Not on file       Tobacco Use         ?  Smoking status:  Never Smoker     ?  Smokeless tobacco:  Never Used       Substance and Sexual Activity         ?  Alcohol use:  Yes             Comment: occassional         ?  Drug use:  Not Currently     ?  Sexual activity:  Not on file       Lifestyle        ?  Physical activity               Days per week:  Not on file         Minutes per session:  Not on file         ?  Stress:  Not on file       Relationships        ?  Social Health visitor on phone:  Not on file         Gets together:  Not on file         Attends religious service:  Not on file         Active member of club or organization:  Not on file         Attends meetings of clubs or organizations:  Not on file         Relationship status:  Not on file        ?  Intimate partner violence              Fear of current or ex partner:  Not on file         Emotionally abused:  Not on file         Physically abused:  Not on file         Forced sexual activity:  Not on file        Other Topics  Concern         ?  Military Service  Not Asked     ?  Blood Transfusions  Not Asked     ?  Caffeine Concern  Not Asked     ?  Occupational Exposure  Not Asked     ?  Hobby Hazards  Not Asked     ?  Sleep Concern  Not Asked     ?  Stress Concern  Not Asked     ?  Weight Concern  Not Asked     ?  Special Diet  Not Asked     ?  Back Care  Not Asked     ?  Exercise  Not Asked     ?  Bike Helmet  Not Asked     ?  Seat Belt  Not Asked     ?  Self-Exams  Not Asked       Social History Narrative        ?  Not on file          Current Outpatient Medications          Medication  Sig  Dispense  Refill           ?  tamoxifen (NOLVADEX) 20 mg tablet  Take 1 Tab by mouth daily. Indications: hormone receptor positive breast cancer  90 Tab  3     ?  lovastatin (MEVACOR) 10 mg tablet  Take 1 Tab by mouth nightly.  90 Tab  3     ?  levothyroxine (SYNTHROID) 75 mcg tablet  Take 1 Tab by mouth Daily (before breakfast).  90 Tab  3           ?  cholecalciferol (VITAMIN D3) (1000 Units /25 mcg) tablet  Take  by mouth daily.               OBJECTIVE:   There were no vitals taken for this visit.        Objective:     Vital Signs: (As obtained by patient/caregiver at home)   There were no vitals taken for this visit.      [INSTRUCTIONS:  "[x]  " Indicates a positive item   "[] " Indicates a  negative item  -- DELETE ALL ITEMS NOT EXAMINED]      Constitutional: [x]   Appears well-developed and well-nourished [x]  No apparent  distress       []   Abnormal -       Mental status: [x]  Alert and awake  [x]  Oriented to person/place/time [x]   Able to follow commands     []  Abnormal -       Eyes:   EOM    [x]   Normal    []  Abnormal -    Sclera  [x]    Normal    []  Abnormal -           Discharge [x]    None visible   []  Abnormal -       HENT: [x]  Normocephalic, atraumatic  []  Abnormal -    [x]  Mouth/Throat: Mucous membranes  are moist      External Ears [x]  Normal  []  Abnormal -      Neck: [x]  No visualized mass []  Abnormal -       Pulmonary/Chest: [x]  Respiratory effort normal   [x]  No visualized signs of difficulty breathing or respiratory distress         []   Abnormal -    No swelling in the right arm or hand compared to the left.            Musculoskeletal:   [x]   Normal gait with no signs of ataxia          [x]   Normal range of motion of neck         []   Abnormal -       Neurological:        [x]   No Facial Asymmetry (Cranial nerve 7 motor function) (limited exam due to video visit)           [x]   No gaze palsy         []  Abnormal -            Skin:        [x]   No significant exanthematous lesions or discoloration noted on facial skin          []   Abnormal -              Psychiatric:       [x]  Normal Affect []  Abnormal -         [x]   No Hallucinations         Labs:   No results found for this or any previous visit (from the past 24 hour(s)).      Imaging:   MRI of the Breasts with and without contrast   ??   CLINICAL INDICATION:  New diagnosis of right 9:00 breast cancer status post   biopsy on 10/1/2019at [facility] demonstrating invasive ductal   carcinoma grade 2. Evaluate for extent of disease, staging, therapy planning. No   reported personal malignancy or breast surgery otherwise, and no family breast   or ovarian cancer.   ??   COMPARISON: Outside mammography and ultrasound  from [facility]   03/13/2018, 03/07/2018, 02/27/2018   ??   TECHNIQUE: Standard MRI sequences were obtained through the breasts in multiple   planes. Images were obtained before and after intravenous infusion of 13 mL of   Dotarem contrast. Images were reviewed with PACS and with Dynacad CAD software.   ??   FINDINGS: The breasts demonstrate moderate bilateral glandularity, and moderate   bilateral background enhancement which could limit accuracy. There are   innumerable tiny enhancing foci and a few scattered small cysts on each side.   ??   Right lateral 9:00 biopsy site demonstrates a clip within an irregular   heterogeneously enhancing mass measuring up to 1.3 x 1.0 x 1.5 cm.   ??   There is no evidence of suspicious enhancing mass, and no dominant or unique   nonmass enhancement, to suggest additional malignancy in either breast.  There   is no evidence of axillary or internal mammary lymphadenopathy. Chest wall   signal is normal.    ??   ??   IMPRESSION   IMPRESSION:    1. Right 9:00 breast cancer measures up to 1.5 cm.   2. No additional discrete abnormality in either breast. Moderate background   enhancement.   3. No evidence of lymphadenopathy or chest wall abnormalities.   ??   Recommend continued management as directed clinically. Annual bilateral   mammogram will be due in September 2020. This patient would likely benefit from   supplemental breast MRI in one year as well.   ??   BI-RADS Assessment Category 6: Known Biopsy Proven Malignancy   ??   ??   Pathology:                      ASSESSMENT:             ICD-10-CM  ICD-9-CM             1.  Malignant neoplasm of central portion of right breast in female, estrogen receptor  positive (HCC)  C50.111  174.1  tamoxifen (NOLVADEX) 20 mg tablet            Z17.0  V86.0  CBC WITH AUTOMATED DIFF           METABOLIC PANEL, COMPREHENSIVE           2.  S/P lumpectomy of breast  Z98.890  V45.89             3.  Abnormal breast finding  N64.59  796.4              Problem List   Date Reviewed:  11/22/2018                        Codes  Class  Noted             Abnormal breast finding  ICD-10-CM: N64.59   ICD-9-CM: 796.4    2018-11-22                       S/P lumpectomy of breast  ICD-10-CM: Z98.890   ICD-9-CM: V45.89    05/17/2018                       Malignant neoplasm of central portion of right breast in female, estrogen receptor positive ([place])  ICD-10-CM: C50.111, Z17.0   ICD-9-CM: 174.1, V86.0    04/12/2018                             PLAN:   Lab studies were personally reviewed.      Breast cancer: 1.6 cm, grade 2 IDC, sentinel lymph node biopsy negative, ER 9%, PR 95%, HER2 negative.  S/p lumpectomy with negative margins.  She has completed definitive surgery.  Because of the  ER/PR positivity, the negative nodal assessment, and the T1 tumor, she would be an excellent candidate for Oncotype Dx for risk stratification, which was low risk at 6.  Therefore, chemotherapy can be safely deferred.  We will refer her for radiation  therapy, then begin endocrine therapy.  She has completed hysterectomy but her ovaries are intact, her FSH/LH/E2 levels were borderline so we felt it would be prudent to proceed with tamoxifen for 2-3 years, then transition to AI once she is clearly post-menopausal.   We discussed side effects of tamoxifen including the risk of VTE, as well as vasomotor symptoms and arthralgias.  She understands and agrees to proceed after radiation is completed.        Here for follow-up.  Doing well on tamoxifen in general.  She did have some hot flashes at the onset but these are easing up.  No significant arthralgias.     Recent pain in the right breast per HPI - has improved this week, but she is concerned that she can feel an abnormality in the tail of breast/axilla.  She will come in for breast examination.        All questions were asked and answered to the best of my ability.     RTC for breast examination to address area of pt's concern.                    Lurene Shadow, MD   [facility] [place] [address]   Office : [phone]   Fax : [phone]

## 2018-11-01 NOTE — Patient Instructions (Addendum)
Patient Instructions from Today's Visit    Reason for Visit:  Follow up breast cancer.      Diagnosis Information:  https://www.cancer.net/about-us/asco-answers-patient-education-materials/asco-answers-fact-sheets    Plan:  Continue tamoxifen.  Come in for a physical exam of the nodules you've been feeling.      Follow Up:  Come in for a physical exam in the next couple of weeks.  Then in 6 months with labs.        Recent Lab Results:  No results found for this or any previous visit (from the past 24 hour(s)).      Treatment Summary has been discussed and given to patient: n/a        -------------------------------------------------------------------------------------------------------------------  Please call our office at [phone] if you have any  of the following symptoms:   ?? Fever of 100.5 or greater  ?? Chills  ?? Shortness of breath  ?? Swelling or pain in one leg    After office hours an answering service is available and will contact a provider for emergencies or if you are experiencing any of the above symptoms.    ? Patient has My Chart.  My Chart log in information explained on the after visit summary printout at the [facility] desk.    Vania Rea, RN

## 2018-11-01 NOTE — Progress Notes (Signed)
Consent: Jill Kaufman was seen by synchronous (real-time) audio-video technology, and/or her healthcare decision maker, is aware that this patient-initiated, Telehealth encounter on 11/01/2018 is a billable service, with coverage as determined by her insurance carrier. She is aware that she may receive a bill and has provided verbal consent to proceed: Yes. Services were provided through video using [email or web address] discussing virtually to substitute for in-person clinic visit.      Total Time: 15-20 minutes.    Pursuant to the emergency declaration under the [place], 1135 waiver authority and the [employer] Act, this Virtual  Visit was conducted, with patient's (and/or legal guardian's) consent, to reduce the patient's risk of exposure to COVID-19 and provide necessary medical care.o synchronous discussion virtually to substitute for in-person clinic visit.   Patient and provider were located at their individual homes.    [facility]: Office Visit New Patient H & P    Chief Complaint:    Chief Complaint   Patient presents with   ??? Follow-up   ??? Breast Cancer     History of Present Illness:  Jill Kaufman is a 53 y.o. female who presents today for evaluation regarding breast cancer.  She underwent routine mammogram in September 2019 which showed an irregular mass density in the upper outer right breast.  She underwent ultrasound and biopsy which showed invasive ductal carcinoma, grade 2, ER 95%, PR 95%, HER2 negative.  MRI showed the lesion to be 1.5 cm with no other abnormalities.  She was referred to Dr. Domenica Fail and underwent lumpectomy and sentinel node biopsy on 05/04/18 which showed 1.6 cm of tumor with negative margins and 1 sentinel lymph node negative for tumor.  Oncotype Dx testing was low risk at 6; therefore, chemotherapy can be safely deferred.  We will refer her for radiation therapy, then begin  endocrine therapy.  She has completed hysterectomy but her ovaries are intact, her FSH/LH/E2 levels were borderline so we felt it would be prudent to proceed with tamoxifen for 2-3 years, then transition to AI once she is clearly post-menopausal.  We discussed side effects of tamoxifen including the risk of VTE, as well as vasomotor symptoms and arthralgias.  She understands and agrees to proceed after radiation is completed.    Here for follow-up via [email or web address] video conference.  On Tamoxifen and has been tolerating it relatively well.  Hot flashes have subsided.  In last 2-3 weeks, she noted worsening pains in her left breast.  The pain was sharp and initially recurrent/few times a day.  It was severe enough that it'd take her breath away.  Not positional.  She felt no difference in quality or severity of pain despite switching out bra/different support.  This pain has been easing up a little and she feels the breast is now sore more than painful.  She has been doing self examinations and noted nodules in her breast tail/axilla.  One area feels more nodular.  She would like this examined.  She has no s/s of infection and has been practicing social distancing/COVID-19 precautions.  No swelling in her arm or hand.      Review of Systems:  Constitutional: Negative.   HENT: Negative.   Eyes: Negative.   Respiratory: Negative.   Cardiovascular: Negative.   Gastrointestinal: Negative.   Genitourinary: Negative.   Musculoskeletal: Negative.   Skin: Negative.   Neurological: Negative.   Endo/Heme/Allergies: Negative.   Psychiatric/Behavioral: Negative.  All other systems reviewed and are negative.     Allergies   Allergen Reactions   ??? Codeine Vertigo     Past Medical History:   Diagnosis Date   ??? Breast cancer ([place])     T1cN0 right breast cancer   ??? GERD (gastroesophageal reflux disease)     OTC meds as needed    ??? High cholesterol    ??? Nausea & vomiting    ??? Thyroid disease 06/14/2011    Hypothyroidism       Past Surgical History:   Procedure Laterality Date   ??? HX BREAST BIOPSY Right 05/04/2018    RIGHT BREAST NEEDLE LOCALIZED BIOPSY performed by Lorenda Cahill, MD at [facility]   ??? HX BREAST LUMPECTOMY Right 05/04/2018    RIGHT BREAST LUMPECTOMY performed by Lorenda Cahill, [place] at [place]   ??? HX GYN      hysterectomy partial-still has ovaries     Family History   Problem Relation Age of Onset   ??? Heart Attack Mother    ??? Cancer Father         throat cancer, skin cancer    ??? Diabetes Father    ??? Heart Attack Father      Social History     Socioeconomic History   ??? Marital status: MARRIED     Spouse name: Not on file   ??? Number of children: Not on file   ??? Years of education: Not on file   ??? Highest education level: Not on file   Occupational History   ??? Not on file   Social Needs   ??? Financial resource strain: Not on file   ??? Food insecurity     Worry: Not on file     Inability: Not on file   ??? Transportation needs     Medical: Not on file     Non-medical: Not on file   Tobacco Use   ??? Smoking status: Never Smoker   ??? Smokeless tobacco: Never Used   Substance and Sexual Activity   ??? Alcohol use: Yes     Comment: occassional   ??? Drug use: Not Currently   ??? Sexual activity: Not on file   Lifestyle   ??? Physical activity     Days per week: Not on file     Minutes per session: Not on file   ??? Stress: Not on file   Relationships   ??? Social Product manager on phone: Not on file     Gets together: Not on file     Attends religious service: Not on file     Active member of club or organization: Not on file     Attends meetings of clubs or organizations: Not on file     Relationship status: Not on file   ??? Intimate partner violence     Fear of current or ex partner: Not on file     Emotionally abused: Not on file     Physically abused: Not on file     Forced sexual activity: Not on file   Other Topics Concern   ??? Military Service Not Asked   ??? Blood Transfusions Not Asked   ??? Caffeine Concern Not Asked    ??? Occupational Exposure Not Asked   ??? Hobby Hazards Not Asked   ??? Sleep Concern Not Asked   ??? Stress Concern Not Asked   ??? Weight Concern Not Asked   ???  Special Diet Not Asked   ??? Back Care Not Asked   ??? Exercise Not Asked   ??? Bike Helmet Not Asked   ??? Seat Belt Not Asked   ??? Self-Exams Not Asked   Social History Narrative   ??? Not on file     Current Outpatient Medications   Medication Sig Dispense Refill   ??? tamoxifen (NOLVADEX) 20 mg tablet Take 1 Tab by mouth daily. Indications: hormone receptor positive breast cancer 90 Tab 3   ??? lovastatin (MEVACOR) 10 mg tablet Take 1 Tab by mouth nightly. 90 Tab 3   ??? levothyroxine (SYNTHROID) 75 mcg tablet Take 1 Tab by mouth Daily (before breakfast). 90 Tab 3   ??? cholecalciferol (VITAMIN D3) (1000 Units /25 mcg) tablet Take  by mouth daily.         OBJECTIVE:  There were no vitals taken for this visit.    Objective:   Vital Signs: (As obtained by patient/caregiver at home)  There were no vitals taken for this visit.     [INSTRUCTIONS:  "[x] " Indicates a positive item  "[] " Indicates a negative item  -- DELETE ALL ITEMS NOT EXAMINED]    Constitutional: [x]  Appears well-developed and well-nourished [x]  No apparent distress      []  Abnormal -     Mental status: [x]  Alert and awake  [x]  Oriented to person/place/time [x]  Able to follow commands    []  Abnormal -     Eyes:   EOM    [x]   Normal    []  Abnormal -   Sclera  [x]   Normal    []  Abnormal -          Discharge [x]   None visible   []  Abnormal -     HENT: [x]  Normocephalic, atraumatic  []  Abnormal -   [x]  Mouth/Throat: Mucous membranes are moist    External Ears [x]  Normal  []  Abnormal -    Neck: [x]  No visualized mass []  Abnormal -     Pulmonary/Chest: [x]  Respiratory effort normal   [x]  No visualized signs of difficulty breathing or respiratory distress        []  Abnormal -   No swelling in the right arm or hand compared to the left.         Musculoskeletal:   [x]  Normal gait with no signs of ataxia          [x]  Normal range of motion of neck        []  Abnormal -     Neurological:        [x]  No Facial Asymmetry (Cranial nerve 7 motor function) (limited exam due to video visit)          [x]  No gaze palsy        []  Abnormal -          Skin:        [x]  No significant exanthematous lesions or discoloration noted on facial skin         []  Abnormal -            Psychiatric:       [x]  Normal Affect []  Abnormal -        [x]  No Hallucinations      Labs:  No results found for this or any previous visit (from the past 24 hour(s)).    Imaging:  MRI of the Breasts with and without contrast  ??  CLINICAL INDICATION:  New diagnosis of right 9:00  breast cancer status post  biopsy on 10/1/2019at [facility] demonstrating invasive ductal  carcinoma grade 2. Evaluate for extent of disease, staging, therapy planning. No  reported personal malignancy or breast surgery otherwise, and no family breast  or ovarian cancer.  ??  COMPARISON: Outside mammography and ultrasound from [facility]  03/13/2018, 03/07/2018, 02/27/2018  ??  TECHNIQUE: Standard MRI sequences were obtained through the breasts in multiple  planes. Images were obtained before and after intravenous infusion of 13 mL of  Dotarem contrast. Images were reviewed with PACS and with Dynacad CAD software.  ??  FINDINGS: The breasts demonstrate moderate bilateral glandularity, and moderate  bilateral background enhancement which could limit accuracy. There are  innumerable tiny enhancing foci and a few scattered small cysts on each side.  ??  Right lateral 9:00 biopsy site demonstrates a clip within an irregular  heterogeneously enhancing mass measuring up to 1.3 x 1.0 x 1.5 cm.  ??  There is no evidence of suspicious enhancing mass, and no dominant or unique  nonmass enhancement, to suggest additional malignancy in either breast.  There  is no evidence of axillary or internal mammary lymphadenopathy. Chest wall  signal is normal.   ??  ??  IMPRESSION   IMPRESSION:   1. Right 9:00 breast cancer measures up to 1.5 cm.  2. No additional discrete abnormality in either breast. Moderate background  enhancement.  3. No evidence of lymphadenopathy or chest wall abnormalities.  ??  Recommend continued management as directed clinically. Annual bilateral  mammogram will be due in September 2020. This patient would likely benefit from  supplemental breast MRI in one year as well.  ??  BI-RADS Assessment Category 6: Known Biopsy Proven Malignancy  ??  ??  Pathology:            ASSESSMENT:    ICD-10-CM ICD-9-CM    1. Malignant neoplasm of central portion of right breast in female, estrogen receptor positive (HCC) C50.111 174.1 tamoxifen (NOLVADEX) 20 mg tablet    Z17.0 V86.0 CBC WITH AUTOMATED DIFF      METABOLIC PANEL, COMPREHENSIVE   2. S/P lumpectomy of breast Z98.890 V45.89    3. Abnormal breast finding N64.59 796.4      Problem List  Date Reviewed: Nov 04, 2018          Codes Class Noted    Abnormal breast finding ICD-10-CM: N64.59  ICD-9-CM: 796.4  11/04/18        S/P lumpectomy of breast ICD-10-CM: Z98.890  ICD-9-CM: V45.89  05/17/2018        Malignant neoplasm of central portion of right breast in female, estrogen receptor positive ([place]) ICD-10-CM: C50.111, Z17.0  ICD-9-CM: 174.1, V86.0  04/12/2018                PLAN:  Lab studies were personally reviewed.    Breast cancer: 1.6 cm, grade 2 IDC, sentinel lymph node biopsy negative, ER 9%, PR 95%, HER2 negative.  S/p lumpectomy with negative margins.  She has completed definitive surgery.  Because of the ER/PR positivity, the negative nodal assessment, and the T1 tumor, she would be an excellent candidate for Oncotype Dx for risk stratification, which was low risk at 6.  Therefore, chemotherapy can be safely deferred.  We will refer her for radiation therapy, then begin endocrine therapy.  She has completed hysterectomy but her ovaries are intact, her FSH/LH/E2 levels were  borderline so we felt it would be prudent to proceed with tamoxifen for 2-3 years,  then transition to AI once she is clearly post-menopausal.  We discussed side effects of tamoxifen including the risk of VTE, as well as vasomotor symptoms and arthralgias.  She understands and agrees to proceed after radiation is completed.      Here for follow-up.  Doing well on tamoxifen in general.  She did have some hot flashes at the onset but these are easing up.  No significant arthralgias.    Recent pain in the right breast per HPI - has improved this week, but she is concerned that she can feel an abnormality in the tail of breast/axilla.  She will come in for breast examination.      All questions were asked and answered to the best of my ability.    RTC for breast examination to address area of pt's concern.            Lurene Shadow, MD  [facility] [place] [address]  Office : [phone]  Fax : [phone]

## 2018-11-30 ENCOUNTER — Ambulatory Visit: Attending: Internal Medicine | Primary: Family Medicine

## 2018-11-30 ENCOUNTER — Encounter: Primary: Family Medicine

## 2018-11-30 ENCOUNTER — Ambulatory Visit
Admit: 2018-11-30 | Discharge: 2018-11-30 | Payer: PRIVATE HEALTH INSURANCE | Attending: Internal Medicine | Primary: Family Medicine

## 2018-11-30 DIAGNOSIS — C50111 Malignant neoplasm of central portion of right female breast: Secondary | ICD-10-CM

## 2018-11-30 DIAGNOSIS — Z170 Estrogen receptor positive status [ER+]: Secondary | ICD-10-CM

## 2018-11-30 NOTE — Progress Notes (Signed)
 Saw patient with Dr Mertha who states Breast exam shows some nodularity superior to the sentinel node scar, this feels like some post-operative change, possibly some underlying seroma.  My suspicion for recurrence or other pathology that needs immediate intervention is low - I would recommend proceeding with her mammogram in October as scheduled, but I would also add an ultrasound at that visit.  She is comfortable with the plan and will call us  if she becomes more symptomatic.  All questions were asked and answered to the best of my ability.  F/u in November as previously planned. Encouraged to call with questions. Navigation will continue to follow.

## 2018-11-30 NOTE — Progress Notes (Signed)
Progress Notes by Greer Ee, MD at 11/30/18 1430                Author: Greer Ee, MD  Service: --  Author Type: Physician       Filed: 11/30/18 1531  Encounter Date: 11/30/2018  Status: Signed          Editor: Greer Ee, MD (Physician)               [facility] Hematology and Oncology: Office Visit Established Patient      Chief Complaint:       Chief Complaint       Patient presents with        ?  Follow-up        History of Present Illness:   [name] is a  53 y.o. female who presents today for evaluation regarding breast cancer.  She underwent  routine mammogram in September 2019 which showed an irregular mass density in the upper outer right breast.  She underwent ultrasound and biopsy which showed invasive ductal carcinoma, grade 2, ER 95%, PR 95%, HER2 negative.  MRI showed the lesion to  be 1.5 cm with no other abnormalities.  She was referred to Dr. Domenica Fail and underwent lumpectomy and sentinel node biopsy on 05/04/18 which showed 1.6 cm of tumor with negative margins and 1 sentinel lymph node negative for tumor.  Oncotype Dx testing  was low risk at 6; therefore, chemotherapy can be safely deferred.  We will refer her for radiation therapy, then begin endocrine therapy.  She has completed hysterectomy but her ovaries are intact,  her FSH/LH/E2 levels were borderline so we felt it would be prudent to proceed with tamoxifen for 2-3 years, then transition to AI once she is clearly post-menopausal.        Here for follow-up.  She was seen virtually for tamoxifen follow-up, but is now brought in for breast exam, as she was having worsening right breast pain, with some nodularity at the lateral border/axilla.  No other symptoms, tamoxifen is going well otherwise.   She had hot flashes at first, these waned but now are back.  They do awaken her from sleep but only briefly, she does not feel she needs additional therapy.           Review of Systems:   Constitutional: Negative.    HENT:  Negative.    Eyes: Negative.    Respiratory: Negative.    Cardiovascular: Negative.    Gastrointestinal: Negative.    Genitourinary: Negative.    Musculoskeletal: Negative.    Skin: Negative.    Neurological: Negative.    Endo/Heme/Allergies: Negative.    Psychiatric/Behavioral: Negative.    All other systems reviewed and are negative.         Allergies        Allergen  Reactions         ?  Codeine  Vertigo          Past Medical History:        Diagnosis  Date         ?  Breast cancer ([place])            T1cN0 right breast cancer         ?  GERD (gastroesophageal reflux disease)            OTC meds as needed          ?  High cholesterol       ?  Nausea & vomiting       ?  Thyroid disease  06/14/2011          Hypothyroidism           Past Surgical History:         Procedure  Laterality  Date          ?  HX BREAST BIOPSY  Right  05/04/2018          RIGHT BREAST NEEDLE LOCALIZED BIOPSY performed by Lorenda Cahill, MD at [facility]          ?  HX BREAST LUMPECTOMY  Right  05/04/2018          RIGHT BREAST LUMPECTOMY performed by Lorenda Cahill, MD at [place] [facility]          ?  HX GYN              hysterectomy partial-still has ovaries          Family History         Problem  Relation  Age of Onset          ?  Heart Attack  Mother       ?  Cancer  Father                throat cancer, skin cancer           ?  Diabetes  Father            ?  Heart Attack  Father            Social History          Socioeconomic History         ?  Marital status:  MARRIED              Spouse name:  Not on file         ?  Number of children:  Not on file     ?  Years of education:  Not on file     ?  Highest education level:  Not on file       Occupational History        ?  Not on file       Social Needs         ?  Financial resource strain:  Not on file        ?  Food insecurity              Worry:  Not on file         Inability:  Not on file        ?  Transportation needs              Medical:  Not on file         Non-medical:  Not on file        Tobacco Use         ?  Smoking status:  Never Smoker     ?  Smokeless tobacco:  Never Used       Substance and Sexual Activity         ?  Alcohol use:  Yes             Comment: occassional         ?  Drug use:  Not Currently     ?  Sexual activity:  Not on file  Lifestyle        ?  Physical activity              Days per week:  Not on file         Minutes per session:  Not on file         ?  Stress:  Not on file       Relationships        ?  Social Health visitor on phone:  Not on file         Gets together:  Not on file         Attends religious service:  Not on file         Active member of club or organization:  Not on file         Attends meetings of clubs or organizations:  Not on file         Relationship status:  Not on file        ?  Intimate partner violence              Fear of current or ex partner:  Not on file         Emotionally abused:  Not on file         Physically abused:  Not on file         Forced sexual activity:  Not on file        Other Topics  Concern         ?  Military Service  Not Asked     ?  Blood Transfusions  Not Asked     ?  Caffeine Concern  Not Asked     ?  Occupational Exposure  Not Asked     ?  Hobby Hazards  Not Asked     ?  Sleep Concern  Not Asked     ?  Stress Concern  Not Asked     ?  Weight Concern  Not Asked     ?  Special Diet  Not Asked     ?  Back Care  Not Asked     ?  Exercise  Not Asked     ?  Bike Helmet  Not Asked     ?  Seat Belt  Not Asked     ?  Self-Exams  Not Asked       Social History Narrative        ?  Not on file          Current Outpatient Medications          Medication  Sig  Dispense  Refill           ?  tamoxifen (NOLVADEX) 20 mg tablet  Take 1 Tab by mouth daily. Indications: hormone receptor positive breast cancer  90 Tab  3     ?  lovastatin (MEVACOR) 10 mg tablet  Take 1 Tab by mouth nightly.  90 Tab  3     ?  levothyroxine (SYNTHROID) 75 mcg tablet  Take 1 Tab by mouth Daily (before breakfast).  90 Tab  3           ?   cholecalciferol (VITAMIN D3) (1000 Units /25 mcg) tablet  Take  by mouth daily.               OBJECTIVE:  Visit Vitals       BP  (!) 143/91  Comment: standing        Pulse  82     Temp  98 ??F (36.7 ??C) (Oral)     Resp  22     Wt  147 lb 14.4 oz (67.1 kg)     SpO2  98%        BMI  25.79 kg/m??             Objective:        Visit Vitals       BP  (!) 143/91  Comment: standing        Pulse  82     Temp  98 ??F (36.7 ??C) (Oral)     Resp  22     Wt  147 lb 14.4 oz (67.1 kg)     SpO2  98%        BMI  25.79 kg/m??            Constitutional:  Well developed, well nourished female in no acute  distress, sitting comfortably in the exam room chair.         HEENT:  Normocephalic and atraumatic. Oropharynx is clear, mucous membranes are moist.  Sclerae anicteric. Neck supple without JVD. No thyromegaly present.      Lymph node     No palpable submandibular, cervical, supraclavicular, axillary lymph nodes.     Skin  Warm and dry.  No bruising and no rash noted.  No erythema.  No pallor.      Respiratory  Lungs are clear to auscultation bilaterally without wheezes, rales or rhonchi, normal air exchange without accessory muscle use.      CVS  Normal rate, regular rhythm and normal S1 and S2.  No murmurs, gallops, or rubs.     Abdomen  Soft, nontender and nondistended, normoactive bowel sounds.  No palpable mass.  No hepatosplenomegaly.     Neuro  Grossly nonfocal with no obvious sensory or motor deficits.     MSK  Normal range of motion in general.  No edema and no tenderness.     Breast  Performed in sitting position.  No obvious asymmetry. Right breast with some nodularity above the right sentinel node excision, buckled underneath to the incision.   No discrete mass or skin change.  Left breast is without mass, nodularity, skin change, nipple retraction or discharge, or axillary mass.       Psych  Appropriate mood and affect.          Labs:   No results found for this or any previous visit (from the past 24 hour(s)).       Imaging:   MRI of the Breasts with and without contrast   ??   CLINICAL INDICATION:  New diagnosis of right 9:00 breast cancer status post   biopsy on 10/1/2019at [facility] demonstrating invasive ductal   carcinoma grade 2. Evaluate for extent of disease, staging, therapy planning. No   reported personal malignancy or breast surgery otherwise, and no family breast   or ovarian cancer.   ??   COMPARISON: Outside mammography and ultrasound from [facility]   03/13/2018, 03/07/2018, 02/27/2018   ??   TECHNIQUE: Standard MRI sequences were obtained through the breasts in multiple   planes. Images were obtained before and after intravenous infusion of 13 mL of   Dotarem contrast. Images were reviewed with PACS and with Dynacad CAD software.   ??  FINDINGS: The breasts demonstrate moderate bilateral glandularity, and moderate   bilateral background enhancement which could limit accuracy. There are   innumerable tiny enhancing foci and a few scattered small cysts on each side.   ??   Right lateral 9:00 biopsy site demonstrates a clip within an irregular   heterogeneously enhancing mass measuring up to 1.3 x 1.0 x 1.5 cm.   ??   There is no evidence of suspicious enhancing mass, and no dominant or unique   nonmass enhancement, to suggest additional malignancy in either breast.  There   is no evidence of axillary or internal mammary lymphadenopathy. Chest wall   signal is normal.    ??   ??   IMPRESSION   IMPRESSION:    1. Right 9:00 breast cancer measures up to 1.5 cm.   2. No additional discrete abnormality in either breast. Moderate background   enhancement.   3. No evidence of lymphadenopathy or chest wall abnormalities.   ??   Recommend continued management as directed clinically. Annual bilateral   mammogram will be due in September 2020. This patient would likely benefit from   supplemental breast MRI in one year as well.   ??   BI-RADS Assessment Category 6: Known Biopsy Proven Malignancy   ??    ??   Pathology:                      ASSESSMENT:             ICD-10-CM  ICD-9-CM             1.  Malignant neoplasm of central portion of right breast in female, estrogen receptor positive ([place])  C50.111  174.1  US BREAST AXILLA RT            Z17.0  V86.0             Problem List   Date Reviewed:  December 19, 2018                        Codes  Class  Noted             Abnormal breast finding  ICD-10-CM: N64.59   ICD-9-CM: 796.4    11/01/2018                       S/P lumpectomy of breast  ICD-10-CM: Z98.890   ICD-9-CM: V45.89    05/17/2018                       Malignant neoplasm of central portion of right breast in female, estrogen receptor positive ([place])  ICD-10-CM: C50.111, Z17.0   ICD-9-CM: 174.1, V86.0    04/12/2018                             PLAN:   Lab studies were personally reviewed.      Breast cancer: 1.6 cm, grade 2 IDC, sentinel lymph node biopsy negative, ER 9%, PR 95%, HER2 negative.  S/p lumpectomy with negative margins.  She has completed definitive surgery.  Because of the  ER/PR positivity, the negative nodal assessment, and the T1 tumor, she would be an excellent candidate for Oncotype Dx for risk stratification, which was low risk at 6.  Therefore, chemotherapy can be safely deferred.  We will refer her for radiation  therapy, then begin endocrine therapy.  She has completed hysterectomy but her ovaries are intact, her FSH/LH/E2 levels were borderline so we felt it would be prudent to proceed with tamoxifen for 2-3 years, then transition to AI once she is clearly post-menopausal.   We discussed side effects of tamoxifen including the risk of VTE, as well as vasomotor symptoms and arthralgias.  She started tamoxifen after radiation was completed.        Here for follow-up.  She was seen virtually for tamoxifen follow-up, but is now brought in for breast exam, as she was having worsening right breast pain, with some nodularity at the lateral border/axilla.  No other symptoms, tamoxifen is going well  otherwise.   She had hot flashes at first, these waned but now are back.  They do awaken her from sleep but only briefly, she does not feel she needs additional therapy.  Labs (from last month) reviewed and unremarkable.  Breast exam shows some nodularity superior  to the sentinel node scar, this feels like some post-operative change, possibly some underlying seroma.  My suspicion for recurrence or other pathology that needs immediate intervention is low - I would recommend proceeding with her mammogram in October  as scheduled, but I would also add an ultrasound at that visit.  She is comfortable with the plan and will call us if she becomes more symptomatic.  All questions were asked and answered to the best of my ability.  F/u in November as previously planned.                    Greer Ee, MD   [facility] Hematology [facility] [place] [address]   Office : [phone]   Fax : [phone]

## 2018-11-30 NOTE — Progress Notes (Signed)
Saw patient with Dr Jenetta Loges who states "Breast exam shows some nodularity superior to the sentinel node scar, this feels like some post-operative change, possibly some underlying seroma.  My suspicion for recurrence or other pathology that needs immediate intervention is low - I would recommend proceeding with her mammogram in October as scheduled, but I would also add an ultrasound at that visit.  She is comfortable with the plan and will call us if she becomes more symptomatic.  All questions were asked and answered to the best of my ability.  F/u in November as previously planned." Encouraged to call with questions. Navigation will continue to follow.      ??

## 2018-11-30 NOTE — Progress Notes (Signed)
[facility]: Office Visit Established Patient    Chief Complaint:    Chief Complaint   Patient presents with   ??? Follow-up     History of Present Illness:  Jill Kaufman is a 53 y.o. female who presents today for evaluation regarding breast cancer.  She underwent routine mammogram in September 2019 which showed an irregular mass density in the upper outer right breast.  She underwent ultrasound and biopsy which showed invasive ductal carcinoma, grade 2, ER 95%, PR 95%, HER2 negative.  MRI showed the lesion to be 1.5 cm with no other abnormalities.  She was referred to Dr. Domenica Fail and underwent lumpectomy and sentinel node biopsy on 05/04/18 which showed 1.6 cm of tumor with negative margins and 1 sentinel lymph node negative for tumor.  Oncotype Dx testing was low risk at 6; therefore, chemotherapy can be safely deferred.  We will refer her for radiation therapy, then begin endocrine therapy.  She has completed hysterectomy but her ovaries are intact, her FSH/LH/E2 levels were borderline so we felt it would be prudent to proceed with tamoxifen for 2-3 years, then transition to AI once she is clearly post-menopausal.      Here for follow-up.  She was seen virtually for tamoxifen follow-up, but is now brought in for breast exam, as she was having worsening right breast pain, with some nodularity at the lateral border/axilla.  No other symptoms, tamoxifen is going well otherwise.  She had hot flashes at first, these waned but now are back.  They do awaken her from sleep but only briefly, she does not feel she needs additional therapy.         Review of Systems:  Constitutional: Negative.   HENT: Negative.   Eyes: Negative.   Respiratory: Negative.   Cardiovascular: Negative.   Gastrointestinal: Negative.   Genitourinary: Negative.   Musculoskeletal: Negative.   Skin: Negative.   Neurological: Negative.   Endo/Heme/Allergies: Negative.   Psychiatric/Behavioral: Negative.    All other systems reviewed and are negative.     Allergies   Allergen Reactions   ??? Codeine Vertigo     Past Medical History:   Diagnosis Date   ??? Breast cancer ([facility] [facility])     T1cN0 right breast cancer   ??? GERD (gastroesophageal reflux disease)     OTC meds as needed    ??? High cholesterol    ??? Nausea & vomiting    ??? Thyroid disease 06/14/2011    Hypothyroidism      Past Surgical History:   Procedure Laterality Date   ??? HX BREAST BIOPSY Right 05/04/2018    RIGHT BREAST NEEDLE LOCALIZED BIOPSY performed by Lorenda Cahill, MD at [place]   ??? HX BREAST LUMPECTOMY Right 05/04/2018    RIGHT BREAST LUMPECTOMY performed by Lorenda Cahill, MD at [place]   ??? HX GYN      hysterectomy partial-still has ovaries     Family History   Problem Relation Age of Onset   ??? Heart Attack Mother    ??? Cancer Father         throat cancer, skin cancer    ??? Diabetes Father    ??? Heart Attack Father      Social History     Socioeconomic History   ??? Marital status: MARRIED     Spouse name: Not on file   ??? Number of children: Not on file   ??? Years of education: Not on file   ???  Highest education level: Not on file   Occupational History   ??? Not on file   Social Needs   ??? Financial resource strain: Not on file   ??? Food insecurity     Worry: Not on file     Inability: Not on file   ??? Transportation needs     Medical: Not on file     Non-medical: Not on file   Tobacco Use   ??? Smoking status: Never Smoker   ??? Smokeless tobacco: Never Used   Substance and Sexual Activity   ??? Alcohol use: Yes     Comment: occassional   ??? Drug use: Not Currently   ??? Sexual activity: Not on file   Lifestyle   ??? Physical activity     Days per week: Not on file     Minutes per session: Not on file   ??? Stress: Not on file   Relationships   ??? Social Product manager on phone: Not on file     Gets together: Not on file     Attends religious service: Not on file     Active member of club or organization: Not on file      Attends meetings of clubs or organizations: Not on file     Relationship status: Not on file   ??? Intimate partner violence     Fear of current or ex partner: Not on file     Emotionally abused: Not on file     Physically abused: Not on file     Forced sexual activity: Not on file   Other Topics Concern   ??? Military Service Not Asked   ??? Blood Transfusions Not Asked   ??? Caffeine Concern Not Asked   ??? Occupational Exposure Not Asked   ??? Hobby Hazards Not Asked   ??? Sleep Concern Not Asked   ??? Stress Concern Not Asked   ??? Weight Concern Not Asked   ??? Special Diet Not Asked   ??? Back Care Not Asked   ??? Exercise Not Asked   ??? Bike Helmet Not Asked   ??? Seat Belt Not Asked   ??? Self-Exams Not Asked   Social History Narrative   ??? Not on file     Current Outpatient Medications   Medication Sig Dispense Refill   ??? tamoxifen (NOLVADEX) 20 mg tablet Take 1 Tab by mouth daily. Indications: hormone receptor positive breast cancer 90 Tab 3   ??? lovastatin (MEVACOR) 10 mg tablet Take 1 Tab by mouth nightly. 90 Tab 3   ??? levothyroxine (SYNTHROID) 75 mcg tablet Take 1 Tab by mouth Daily (before breakfast). 90 Tab 3   ??? cholecalciferol (VITAMIN D3) (1000 Units /25 mcg) tablet Take  by mouth daily.         OBJECTIVE:  Visit Vitals  BP (!) 143/91 Comment: standing   Pulse 82   Temp 98 ??F (36.7 ??C) (Oral)   Resp 22   Wt 147 lb 14.4 oz (67.1 kg)   SpO2 98%   BMI 25.79 kg/m??       Objective:     Visit Vitals  BP (!) 143/91 Comment: standing   Pulse 82   Temp 98 ??F (36.7 ??C) (Oral)   Resp 22   Wt 147 lb 14.4 oz (67.1 kg)   SpO2 98%   BMI 25.79 kg/m??      Constitutional: Well developed, well nourished female in no acute distress, sitting comfortably in  the exam room chair.    HEENT: Normocephalic and atraumatic. Oropharynx is clear, mucous membranes are moist.  Sclerae anicteric. Neck supple without JVD. No thyromegaly present.    Lymph node   No palpable submandibular, cervical, supraclavicular, axillary lymph nodes.    Skin Warm and dry.  No bruising and no rash noted.  No erythema.  No pallor.    Respiratory Lungs are clear to auscultation bilaterally without wheezes, rales or rhonchi, normal air exchange without accessory muscle use.    CVS Normal rate, regular rhythm and normal S1 and S2.  No murmurs, gallops, or rubs.   Abdomen Soft, nontender and nondistended, normoactive bowel sounds.  No palpable mass.  No hepatosplenomegaly.   Neuro Grossly nonfocal with no obvious sensory or motor deficits.   MSK Normal range of motion in general.  No edema and no tenderness.   Breast Performed in sitting position.  No obvious asymmetry. Right breast with some nodularity above the right sentinel node excision, buckled underneath to the incision.  No discrete mass or skin change.  Left breast is without mass, nodularity, skin change, nipple retraction or discharge, or axillary mass.     Psych Appropriate mood and affect.       Labs:  No results found for this or any previous visit (from the past 24 hour(s)).    Imaging:  MRI of the Breasts with and without contrast  ??  CLINICAL INDICATION:  New diagnosis of right 9:00 breast cancer status post  biopsy on 10/1/2019at [facility] demonstrating invasive ductal  carcinoma grade 2. Evaluate for extent of disease, staging, therapy planning. No  reported personal malignancy or breast surgery otherwise, and no family breast  or ovarian cancer.  ??  COMPARISON: Outside mammography and ultrasound from [facility]  03/13/2018, 03/07/2018, 02/27/2018  ??  TECHNIQUE: Standard MRI sequences were obtained through the breasts in multiple  planes. Images were obtained before and after intravenous infusion of 13 mL of  Dotarem contrast. Images were reviewed with PACS and with Dynacad CAD software.  ??  FINDINGS: The breasts demonstrate moderate bilateral glandularity, and moderate  bilateral background enhancement which could limit accuracy. There are   innumerable tiny enhancing foci and a few scattered small cysts on each side.  ??  Right lateral 9:00 biopsy site demonstrates a clip within an irregular  heterogeneously enhancing mass measuring up to 1.3 x 1.0 x 1.5 cm.  ??  There is no evidence of suspicious enhancing mass, and no dominant or unique  nonmass enhancement, to suggest additional malignancy in either breast.  There  is no evidence of axillary or internal mammary lymphadenopathy. Chest wall  signal is normal.   ??  ??  IMPRESSION  IMPRESSION:   1. Right 9:00 breast cancer measures up to 1.5 cm.  2. No additional discrete abnormality in either breast. Moderate background  enhancement.  3. No evidence of lymphadenopathy or chest wall abnormalities.  ??  Recommend continued management as directed clinically. Annual bilateral  mammogram will be due in September 2020. This patient would likely benefit from  supplemental breast MRI in one year as well.  ??  BI-RADS Assessment Category 6: Known Biopsy Proven Malignancy  ??  ??  Pathology:            ASSESSMENT:    ICD-10-CM ICD-9-CM    1. Malignant neoplasm of central portion of right breast in female, estrogen receptor positive (HCC) C50.111 174.1 US BREAST AXILLA RT  Z17.0 V86.0      Problem List  Date Reviewed: 12-15-2018          Codes Class Noted    Abnormal breast finding ICD-10-CM: N64.59  ICD-9-CM: 796.4  11/01/2018        S/P lumpectomy of breast ICD-10-CM: Z98.890  ICD-9-CM: V45.89  05/17/2018        Malignant neoplasm of central portion of right breast in female, estrogen receptor positive ([place]) ICD-10-CM: C50.111, Z17.0  ICD-9-CM: 174.1, V86.0  04/12/2018                PLAN:  Lab studies were personally reviewed.    Breast cancer: 1.6 cm, grade 2 IDC, sentinel lymph node biopsy negative, ER 9%, PR 95%, HER2 negative.  S/p lumpectomy with negative margins.  She has completed definitive surgery.  Because of the ER/PR positivity, the negative nodal assessment, and the T1 tumor, she would be an excellent  candidate for Oncotype Dx for risk stratification, which was low risk at 6.  Therefore, chemotherapy can be safely deferred.  We will refer her for radiation therapy, then begin endocrine therapy.  She has completed hysterectomy but her ovaries are intact, her FSH/LH/E2 levels were borderline so we felt it would be prudent to proceed with tamoxifen for 2-3 years, then transition to AI once she is clearly post-menopausal.  We discussed side effects of tamoxifen including the risk of VTE, as well as vasomotor symptoms and arthralgias.  She started tamoxifen after radiation was completed.      Here for follow-up.  She was seen virtually for tamoxifen follow-up, but is now brought in for breast exam, as she was having worsening right breast pain, with some nodularity at the lateral border/axilla.  No other symptoms, tamoxifen is going well otherwise.  She had hot flashes at first, these waned but now are back.  They do awaken her from sleep but only briefly, she does not feel she needs additional therapy.  Labs (from last month) reviewed and unremarkable.  Breast exam shows some nodularity superior to the sentinel node scar, this feels like some post-operative change, possibly some underlying seroma.  My suspicion for recurrence or other pathology that needs immediate intervention is low - I would recommend proceeding with her mammogram in October as scheduled, but I would also add an ultrasound at that visit.  She is comfortable with the plan and will call us if she becomes more symptomatic.  All questions were asked and answered to the best of my ability.  F/u in November as previously planned.            Greer Ee, MD  [facility] [place] [place] [address]  Office : [phone]  Fax : [phone]

## 2018-11-30 NOTE — Patient Instructions (Signed)
Patient Instructions from Today's Visit    Reason for Visit:  Breast    Plan:  Continue Tamoxifen.  We will add an Ultrasound of your breast when your next Mammogram is due. Meanwhile, just continue to watch it and call us if you need anything.    Follow Up:  Office visit as scheduled    Recent Lab Results:  No results found for this or any previous visit (from the past 12 hour(s)).    Treatment Summary has been discussed and given to patient: n/a    -------------------------------------------------------------------------------------------------------------------  Please call our office at [phone] if you have any  of the following symptoms:   ?? Fever of 100.5 or greater  ?? Chills  ?? Shortness of breath  ?? Swelling or pain in one leg    After office hours an answering service is available and will contact a provider for emergencies or if you are experiencing any of the above symptoms.    ? Patient does express an interest in My Chart.  My Chart log in information explained on the after visit summary printout at the [place] desk.    Zack Seal, RN

## 2019-01-04 LAB — BE WELL HEALTH SCREEN
Cholesterol, Total: 212 MG/DL — ABNORMAL HIGH (ref <200)
Cholesterol, total: 212 MG/DL — ABNORMAL HIGH (ref <200)
Glucose: 88 mg/dL (ref 65–100)
Glucose: 88 mg/dL (ref 65–100)
HDL Cholesterol: 49 MG/DL (ref 40–60)
HDL: 49 MG/DL (ref 40–60)
LDL Calculated: 132.4 MG/DL — ABNORMAL HIGH (ref <100)
LDL, calculated: 132.4 MG/DL — ABNORMAL HIGH (ref <100)
Triglyceride: 153 MG/DL — ABNORMAL HIGH (ref 35–150)
Triglycerides: 153 MG/DL — ABNORMAL HIGH (ref 35–150)

## 2019-02-01 MED FILL — TAMOXIFEN CITRATE 20MG TABS: 20 mg | 90 days supply | Qty: 90 | Fill #1 | Status: AC

## 2019-02-06 ENCOUNTER — Other Ambulatory Visit: Admit: 2019-02-06 | Discharge: 2019-02-06 | Payer: PRIVATE HEALTH INSURANCE | Primary: Family Medicine

## 2019-02-06 DIAGNOSIS — E034 Atrophy of thyroid (acquired): Secondary | ICD-10-CM

## 2019-02-07 LAB — METABOLIC PANEL, COMPREHENSIVE
A-G Ratio: 1.7 (ref 1.2–2.2)
ALT (SGPT): 27 IU/L (ref 0–32)
AST (SGOT): 21 IU/L (ref 0–40)
Albumin: 4.4 g/dL (ref 3.8–4.9)
Alk. phosphatase: 63 IU/L (ref 39–117)
BUN/Creatinine ratio: 20 (ref 9–23)
BUN: 17 mg/dL (ref 6–24)
Bilirubin, total: 0.5 mg/dL (ref 0.0–1.2)
CO2: 23 mmol/L (ref 20–29)
Calcium: 9.1 mg/dL (ref 8.7–10.2)
Chloride: 101 mmol/L (ref 96–106)
Creatinine: 0.86 mg/dL (ref 0.57–1.00)
GFR est AA: 89 mL/min/{1.73_m2} (ref >59)
GFR est non-AA: 77 mL/min/{1.73_m2} (ref >59)
GLOBULIN, TOTAL: 2.6 g/dL (ref 1.5–4.5)
Glucose: 91 mg/dL (ref 65–99)
Potassium: 4.4 mmol/L (ref 3.5–5.2)
Protein, total: 7 g/dL (ref 6.0–8.5)
Sodium: 139 mmol/L (ref 134–144)

## 2019-02-07 LAB — LIPID PANEL
Cholesterol, Total: 213 mg/dL — ABNORMAL HIGH (ref 100–199)
Cholesterol, total: 213 mg/dL — ABNORMAL HIGH (ref 100–199)
HDL Cholesterol: 48 mg/dL (ref >39)
HDL: 48 mg/dL (ref >39)
LDL Calculated: 125 mg/dL — ABNORMAL HIGH (ref 0–99)
LDL, calculated: 125 mg/dL — ABNORMAL HIGH (ref 0–99)
Triglyceride: 198 mg/dL — ABNORMAL HIGH (ref 0–149)
Triglycerides: 198 mg/dL — ABNORMAL HIGH (ref 0–149)
VLDL Cholesterol Calculated: 40 mg/dL (ref 5–40)
VLDL, calculated: 40 mg/dL (ref 5–40)

## 2019-02-07 LAB — TSH 3RD GENERATION
TSH: 4.23 u[IU]/mL (ref 0.450–4.500)
TSH: 4.23 u[IU]/mL (ref 0.450–4.500)

## 2019-02-07 LAB — COMPREHENSIVE METABOLIC PANEL
ALT: 27 IU/L (ref 0–32)
AST: 21 IU/L (ref 0–40)
Albumin/Globulin Ratio: 1.7 NA (ref 1.2–2.2)
Albumin: 4.4 g/dL (ref 3.8–4.9)
Alkaline Phosphatase: 63 IU/L (ref 39–117)
BUN: 17 mg/dL (ref 6–24)
Bun/Cre Ratio: 20 NA (ref 9–23)
CO2: 23 mmol/L (ref 20–29)
Calcium: 9.1 mg/dL (ref 8.7–10.2)
Chloride: 101 mmol/L (ref 96–106)
Creatinine: 0.86 mg/dL (ref 0.57–1.00)
EGFR IF NonAfrican American: 77 mL/min/{1.73_m2} (ref >59)
GFR African American: 89 mL/min/{1.73_m2} (ref >59)
Globulin, Total: 2.6 g/dL (ref 1.5–4.5)
Glucose: 91 mg/dL (ref 65–99)
Potassium: 4.4 mmol/L (ref 3.5–5.2)
Sodium: 139 mmol/L (ref 134–144)
Total Bilirubin: 0.5 mg/dL (ref 0.0–1.2)
Total Protein: 7 g/dL (ref 6.0–8.5)

## 2019-02-20 ENCOUNTER — Telehealth: Attending: Family Medicine | Primary: Family Medicine

## 2019-02-20 ENCOUNTER — Telehealth
Admit: 2019-02-20 | Discharge: 2019-02-20 | Payer: PRIVATE HEALTH INSURANCE | Attending: Family Medicine | Primary: Family Medicine

## 2019-02-20 DIAGNOSIS — E034 Atrophy of thyroid (acquired): Secondary | ICD-10-CM

## 2019-02-20 MED ORDER — PITAVASTATIN 1 MG TAB
1 mg | ORAL_TABLET | Freq: Every day | ORAL | 5 refills | Status: DC
Start: 2019-02-20 — End: 2019-08-07

## 2019-02-20 NOTE — Progress Notes (Signed)
SUBJECTIVE:   Jill Kaufman is a 53 y.o. female who presents today for virtual visit via doxy.me.  Patient has a past medical history significant for hypothyroidism, high cholesterol, high triglycerides and breast cancer.  Current medications are listed in the EMR and reviewed today.  Review of systems reveals no complaints of chest pain, shortness of breath, orthopnea or PND.  Patient does continue to struggle with statin induced joint pain.  This has been a longstanding issue for her.  The only statin that she has been able to tolerate has been Livalo but her insurance has denied its coverage.  She is currently on Mevacor with an increasing issue related to joint pain.  She is having cramps in her lower extremities as well.  Otherwise she is doing well and has no complaints of chest pain, shortness of breath, orthopnea or PND.    Patient's vital signs were not performed as encounter was performed virtually.  Location of virtual visit was patient's home.      HPI  See above    Past Medical History, Past Surgical History, Family history, Social History, and Medications were all reviewed with the patient today and updated as necessary.       Current Outpatient Medications   Medication Sig Dispense Refill   ??? pitavastatin calcium (Livalo) 1 mg tab tablet Take 1 Tab by mouth daily. 30 Tab 5   ??? tamoxifen (NOLVADEX) 20 mg tablet Take 1 Tab by mouth daily. Indications: hormone receptor positive breast cancer 90 Tab 3   ??? levothyroxine (SYNTHROID) 75 mcg tablet Take 1 Tab by mouth Daily (before breakfast). 90 Tab 3     Allergies   Allergen Reactions   ??? Codeine Vertigo     Patient Active Problem List   Diagnosis Code   ??? Malignant neoplasm of central portion of right breast in female, estrogen receptor positive ([place]) C50.111, Z17.0   ??? S/P lumpectomy of breast Z98.890   ??? Abnormal breast finding N64.59     Past Medical History:   Diagnosis Date   ??? Breast cancer ([place])     T1cN0 right breast cancer   ??? GERD (gastroesophageal  reflux disease)     OTC meds as needed    ??? High cholesterol    ??? Nausea & vomiting    ??? Thyroid disease 06/14/2011    Hypothyroidism      Past Surgical History:   Procedure Laterality Date   ??? HX BREAST BIOPSY Right 05/04/2018    RIGHT BREAST NEEDLE LOCALIZED BIOPSY performed by Lorenda Cahill, MD at [place]   ??? HX BREAST LUMPECTOMY Right 05/04/2018    RIGHT BREAST LUMPECTOMY performed by Lorenda Cahill, MD at [place]   ??? HX GYN      hysterectomy partial-still has ovaries     Family History   Problem Relation Age of Onset   ??? Heart Attack Mother    ??? Cancer Father         throat cancer, skin cancer    ??? Diabetes Father    ??? Heart Attack Father      Social History     Tobacco Use   ??? Smoking status: Never Smoker   ??? Smokeless tobacco: Never Used   Substance Use Topics   ??? Alcohol use: Yes     Comment: occassional         Review of Systems  See above    OBJECTIVE:  There were no vitals taken  for this visit.     Physical Exam  Constitutional:       General: She is not in acute distress.     Appearance: Normal appearance. She is not ill-appearing.   HENT:      Head: Normocephalic and atraumatic.   Pulmonary:      Effort: Pulmonary effort is normal.   Neurological:      Mental Status: She is alert.   Psychiatric:         Mood and Affect: Mood normal.         Behavior: Behavior normal.         Thought Content: Thought content normal.         Judgment: Judgment normal.         Medical problems and test results were reviewed with the patient today.         ASSESSMENT and PLAN    1.  Hypothyroidism.  TSH is 4.2.  Continue current dose of Synthroid.    2.  High cholesterol.  LDL is 125.  Strong family history of coronary disease.  Patient would like to see if her insurance may cover Livalo again.  We will rewrite the prescription.  If this does not work with prior authorization we will consider perhaps doing Mevacor 2 or 3 times a week.    3.  High triglycerides.  Triglycerides are 198.  Dietary focus  emphasized.    4.  History of breast cancer.  Continue follow-up with oncology.          Consent:  This patient and/or their healthcare decision maker is aware that this patient-initiated Telehealth encounter is a billable service, with coverage as determined by their insurance carrier. Patient is aware that they may receive a bill and has provided verbal consent to proceed: Yes    I was in the office while conducting this encounter.      This virtual visit was conducted via Doxy.me.  Pursuant to the emergency declaration under the [place], 1135 waiver authority and the [employer] [facility] Act, this Virtual  Visit was conducted to reduce the patient's risk of exposure to COVID-19 and provide continuity of care for an established patient.  Services were provided through a video synchronous discussion virtually to substitute for in-person clinic visit.  Due to this being a TeleHealth evaluation, many elements of the physical examination are unable to be assessed.     Total Time: minutes: 11-20 minutes.    Elements of this note have been dictated using speech recognition software. As a result, errors of speech recognition may have occurred.

## 2019-02-20 NOTE — Progress Notes (Signed)
SUBJECTIVE:   Jill Kaufman is a 53 y.o. female who presents today for virtual visit via doxy.me.  Patient has a past medical history significant for hypothyroidism, high cholesterol, high triglycerides and breast cancer.  Current medications are listed in the EMR and reviewed today.  Review of systems reveals no complaints of chest pain, shortness of breath, orthopnea or PND.  Patient does continue to struggle with statin induced joint pain.  This has been a longstanding issue for her.  The only statin that she has been able to tolerate has been Livalo but her insurance has denied its coverage.  She is currently on Mevacor with an increasing issue related to joint pain.  She is having cramps in her lower extremities as well.  Otherwise she is doing well and has no complaints of chest pain, shortness of breath, orthopnea or PND.    Patient's vital signs were not performed as encounter was performed virtually.  Location of virtual visit was patient's home.      HPI  See above    Past Medical History, Past Surgical History, Family history, Social History, and Medications were all reviewed with the patient today and updated as necessary.       Current Outpatient Medications   Medication Sig Dispense Refill   ??? pitavastatin calcium (Livalo) 1 mg tab tablet Take 1 Tab by mouth daily. 30 Tab 5   ??? tamoxifen (NOLVADEX) 20 mg tablet Take 1 Tab by mouth daily. Indications: hormone receptor positive breast cancer 90 Tab 3   ??? levothyroxine (SYNTHROID) 75 mcg tablet Take 1 Tab by mouth Daily (before breakfast). 90 Tab 3     Allergies   Allergen Reactions   ??? Codeine Vertigo     Patient Active Problem List   Diagnosis Code   ??? Malignant neoplasm of central portion of right breast in female, estrogen receptor positive ([place]) C50.111, Z17.0   ??? S/P lumpectomy of breast Z98.890   ??? Abnormal breast finding N64.59     Past Medical History:   Diagnosis Date   ??? Breast cancer ([place])     T1cN0 right breast cancer    ??? GERD (gastroesophageal reflux disease)     OTC meds as needed    ??? High cholesterol    ??? Nausea & vomiting    ??? Thyroid disease 06/14/2011    Hypothyroidism      Past Surgical History:   Procedure Laterality Date   ??? HX BREAST BIOPSY Right 05/04/2018    RIGHT BREAST NEEDLE LOCALIZED BIOPSY performed by Lorenda Cahill, MD at [place]   ??? HX BREAST LUMPECTOMY Right 05/04/2018    RIGHT BREAST LUMPECTOMY performed by Lorenda Cahill, MD at [place]   ??? HX GYN      hysterectomy partial-still has ovaries     Family History   Problem Relation Age of Onset   ??? Heart Attack Mother    ??? Cancer Father         throat cancer, skin cancer    ??? Diabetes Father    ??? Heart Attack Father      Social History     Tobacco Use   ??? Smoking status: Never Smoker   ??? Smokeless tobacco: Never Used   Substance Use Topics   ??? Alcohol use: Yes     Comment: occassional         Review of Systems  See above    OBJECTIVE:  There were no vitals taken  for this visit.     Physical Exam  Constitutional:       General: She is not in acute distress.     Appearance: Normal appearance. She is not ill-appearing.   HENT:      Head: Normocephalic and atraumatic.   Pulmonary:      Effort: Pulmonary effort is normal.   Neurological:      Mental Status: She is alert.   Psychiatric:         Mood and Affect: Mood normal.         Behavior: Behavior normal.         Thought Content: Thought content normal.         Judgment: Judgment normal.         Medical problems and test results were reviewed with the patient today.         ASSESSMENT and PLAN    1.  Hypothyroidism.  TSH is 4.2.  Continue current dose of Synthroid.    2.  High cholesterol.  LDL is 125.  Strong family history of coronary disease.  Patient would like to see if her insurance may cover Livalo again.  We will rewrite the prescription.  If this does not work with prior authorization we will consider perhaps doing Mevacor 2 or 3 times a week.     3.  High triglycerides.  Triglycerides are 198.  Dietary focus emphasized.    4.  History of breast cancer.  Continue follow-up with oncology.          Consent:  This patient and/or their healthcare decision maker is aware that this patient-initiated Telehealth encounter is a billable service, with coverage as determined by their insurance carrier. Patient is aware that they may receive a bill and has provided verbal consent to proceed: Yes    I was in the office while conducting this encounter.      This virtual visit was conducted via Doxy.me.  Pursuant to the emergency declaration under the [place], 1135 waiver authority and the [employer] Act, this Virtual  Visit was conducted to reduce the patient's risk of exposure to COVID-19 and provide continuity of care for an established patient.  Services were provided through a video synchronous discussion virtually to substitute for in-person clinic visit.  Due to this being a TeleHealth evaluation, many elements of the physical examination are unable to be assessed.     Total Time: minutes: 11-20 minutes.    Elements of this note have been dictated using speech recognition software. As a result, errors of speech recognition may have occurred.

## 2019-03-18 ENCOUNTER — Inpatient Hospital Stay: Admit: 2019-03-18 | Payer: PRIVATE HEALTH INSURANCE | Attending: Internal Medicine | Primary: Family Medicine

## 2019-03-18 ENCOUNTER — Inpatient Hospital Stay: Admit: 2019-03-18 | Payer: PRIVATE HEALTH INSURANCE | Attending: Nurse Practitioner | Primary: Family Medicine

## 2019-03-18 DIAGNOSIS — Z170 Estrogen receptor positive status [ER+]: Secondary | ICD-10-CM

## 2019-03-18 DIAGNOSIS — C50911 Malignant neoplasm of unspecified site of right female breast: Secondary | ICD-10-CM

## 2019-03-20 ENCOUNTER — Inpatient Hospital Stay: Admit: 2019-03-20 | Payer: PRIVATE HEALTH INSURANCE | Attending: Radiation Oncology | Primary: Family Medicine

## 2019-03-20 DIAGNOSIS — C50811 Malignant neoplasm of overlapping sites of right female breast: Secondary | ICD-10-CM

## 2019-03-20 NOTE — Progress Notes (Signed)
Pt here for FUP Post RT to the right breast which ended 08/15/18.  She is taking Tamoxifen as ordered and is followed by Dr. Jenetta Loges in [place].  Pt is s/p a right lumpectomy on 05/04/18.  The 03/18/19 Mammogram was negative.  Pt stated that she has no complaints or concerns today.  Dr. Nance Pew left FUP up to pt whether to come back to [place], or simply to continue with Dr. Jenetta Loges exclusively.  Pt will call us if she needs [place].  Dr. Nance Pew will defer ordering a MRI breast in 6 months to Dr. Jenetta Loges if it is needed.

## 2019-03-20 NOTE — Progress Notes (Signed)
Patient: Cledith Kamiya MRN: [id number]  SSN: [id number]    Date of Birth: [birth year]  Age: 53 y.o.  Sex: female      Other Providers:  Dr. Jenetta Loges    DIAGNOSIS: pT1c pN0 M0 R breast IDC, ER/PR+, Her2-    PREVIOUS TREATMENT:  1. 05/04/2018: R lumpectomy and SLN bx  2. 08/15/2018: Radiation to right breast. Dose: 4256 cGy in 16 fractions right breast, 1000 cGy in 4 fractions right breast boost.  3. Currently on Tamoxifen    INTERVAL HISTORY:  Darlinda Bellows is a 53 y.o. female who is here for routine f/u. She recently had a mammogram and ultrasound which revealed no worrisome findings. She reports that she has occasional pain in the right breast which comes and goes. She denies nipple retraction or skin dimpling. She feels as if her R nipple is darker compared to the L.     UPDATED PAST MEDICAL HISTORY:  Since our prior encounter, Maybelline Kolarik has not been hospitalized.  There have been no significant changes to the medical history.     MEDICATIONS:     Current Outpatient Medications:   ???  pitavastatin calcium (Livalo) 1 mg tab tablet, Take 1 Tab by mouth daily., Disp: 30 Tab, Rfl: 5  ???  tamoxifen (NOLVADEX) 20 mg tablet, Take 1 Tab by mouth daily. Indications: hormone receptor positive breast cancer, Disp: 90 Tab, Rfl: 3  ???  levothyroxine (SYNTHROID) 75 mcg tablet, Take 1 Tab by mouth Daily (before breakfast)., Disp: 90 Tab, Rfl: 3    ALLERGIES:   Allergies   Allergen Reactions   ??? Codeine Vertigo       PHYSICAL EXAMINATION:   ECOG Performance status 0  VITAL SIGNS:   Visit Vitals  BP (!) 138/96   Pulse 65   Temp 97 ??F (36.1 ??C)   Wt 68.4 kg (150 lb 11.2 oz)   SpO2 96%   BMI 26.28 kg/m??      General: well developed/nourished adult Female in no acute distress; appears stated age  58: normocephalic, atraumatic; EOMI  Neck: supple with full ROM;   Respiratory: normal inspiratory effort, no audible wheezes  Extremities: no cyanosis, clubbing, or edema  Musculoskeletal: mobility intact x4; normal ROM in all joints  Skin: no skin  lesions identified  Neuro: AOx3; sensation intact x 4; CNII-XII grossly intact  Psych: appropriate affect, insight, and judgement  GI: abdomen soft, non-distended  Breast: No masses palpated on R; mild telangiectasias on R.    LABORATORY:   Lab Results   Component Value Date/Time    Sodium 139 02/06/2019 09:10 AM    Potassium 4.4 02/06/2019 09:10 AM    Chloride 101 02/06/2019 09:10 AM    CO2 23 02/06/2019 09:10 AM    Anion gap 7 10/31/2018 12:39 PM    Glucose 91 02/06/2019 09:10 AM    BUN 17 02/06/2019 09:10 AM    Creatinine 0.86 02/06/2019 09:10 AM    GFR est AA 89 02/06/2019 09:10 AM    GFR est non-AA 77 02/06/2019 09:10 AM    Calcium 9.1 02/06/2019 09:10 AM    Albumin 4.4 02/06/2019 09:10 AM    Protein, total 7.0 02/06/2019 09:10 AM    Globulin 4.0 (H) 10/31/2018 12:39 PM    A-G Ratio 1.7 02/06/2019 09:10 AM    ALT (SGPT) 27 02/06/2019 09:10 AM     Lab Results   Component Value Date/Time    WBC 7.6 10/31/2018 12:39 PM    HGB 14.0 10/31/2018  12:39 PM    HCT 41.0 10/31/2018 12:39 PM    PLATELET 239 10/31/2018 12:39 PM       RADIOLOGY:  Mam Mammo Bi Dx Incl Cad    Result Date: 03/18/2019  DIAGNOSTIC DIGITAL BILATERAL MAMMOGRAPHY AND right BREAST ULTRASOUND CLINICAL INDICATION: Annual bilateral mammogram, follow-up after right lumpectomy for 9:00 breast cancer in November 2019 and subsequent radiation. Patient reports chronic intermittent moderate pain in her right breast axillary tail and axilla, since the radiation treatment. No family breast cancer. COMPARISON: Mammography 05/04/2018, 03/13/2018, others going back to 07/06/2010. Also MRI breast 03/26/2018. FINDINGS: Mammogram: Digital diagnostic mammography was performed, and is interpreted in conjunction with a computer assisted detection (CAD) system.  Standard views bilaterally demonstrate heterogeneously dense glandular tissues which limits the sensitivity of mammography. Additional right mediolateral, exaggerated CC, and compression magnification views were  performed. There are diffuse bilateral typically benign calcifications which have not significantly changed from older examinations. On the right there is mild expected scarring in the posterior lateral surgical site, and there is mild diffuse skin thickening in keeping with surgery and radiation. No suspicious developing mass, nonsurgical distortion, or focal suspicious calcification cluster is evident on either side. Recommend ultrasound to evaluate the right areas of pain. Ultrasound: Real time targeted sonography was performed of the right axillary tail and right axilla by the radiologist and sonographer. There are normal tissue elements without evidence of a cystic or solid mass, or lymphadenopathy. A few small morphologically normal right axillary lymph nodes are noted.     IMPRESSION: 1. Right expected lumpectomy and radiation change. 2. No evidence of residual or recurrent malignancy in either breast. Recommend clinical follow-up for pain. She may return for bilateral screening mammography in one year.  3. Follow-up bilateral breast MRI is recommended within the next 6 months for new baseline evaluation, supplemental screening, and surveillance given her dense breast parenchyma and increased risk (with personal breast cancer history). Results today were reported to the patient at the time of interpretation. BI-RADS Assessment Category 2: Benign finding BD3 We are including breast density information for the patient along with the mammogram report.     US Breast Axilla Rt    Result Date: 03/18/2019  DIAGNOSTIC DIGITAL BILATERAL MAMMOGRAPHY AND right BREAST ULTRASOUND CLINICAL INDICATION: Annual bilateral mammogram, follow-up after right lumpectomy for 9:00 breast cancer in November 2019 and subsequent radiation. Patient reports chronic intermittent moderate pain in her right breast axillary tail and axilla, since the radiation treatment. No family breast cancer. COMPARISON: Mammography 05/04/2018, 03/13/2018,  others going back to 07/06/2010. Also MRI breast 03/26/2018. FINDINGS: Mammogram: Digital diagnostic mammography was performed, and is interpreted in conjunction with a computer assisted detection (CAD) system.  Standard views bilaterally demonstrate heterogeneously dense glandular tissues which limits the sensitivity of mammography. Additional right mediolateral, exaggerated CC, and compression magnification views were performed. There are diffuse bilateral typically benign calcifications which have not significantly changed from older examinations. On the right there is mild expected scarring in the posterior lateral surgical site, and there is mild diffuse skin thickening in keeping with surgery and radiation. No suspicious developing mass, nonsurgical distortion, or focal suspicious calcification cluster is evident on either side. Recommend ultrasound to evaluate the right areas of pain. Ultrasound: Real time targeted sonography was performed of the right axillary tail and right axilla by the radiologist and sonographer. There are normal tissue elements without evidence of a cystic or solid mass, or lymphadenopathy. A few small morphologically normal right axillary lymph nodes are  noted.     IMPRESSION: 1. Right expected lumpectomy and radiation change. 2. No evidence of residual or recurrent malignancy in either breast. Recommend clinical follow-up for pain. She may return for bilateral screening mammography in one year.  3. Follow-up bilateral breast MRI is recommended within the next 6 months for new baseline evaluation, supplemental screening, and surveillance given her dense breast parenchyma and increased risk (with personal breast cancer history). Results today were reported to the patient at the time of interpretation. BI-RADS Assessment Category 2: Benign finding BD3 We are including breast density information for the patient along with the mammogram report.     TUMOR STATUS:  NED    IMPRESSION:  Lari Linson is a 53 y.o. female with a history of stage IA R breast IDC s/p lumpectomy and SLN bx and radiation. Now on Tamoxifen.    PLAN:    1) Patient is recovering as anticipated from her prior course of radiotherapy.  2) Future follow up will be coordinated with other providers.      Portions of this note were copied from prior encounters and reviewed for accuracy, currency, and represent documentation and tasks completed during this encounter.  I verify and attest these portions to be unchanged from prior visits.      Osa Craver, MD  03/20/19

## 2019-03-20 NOTE — Progress Notes (Signed)
Patient: Jill Kaufman MRN: [id number]  SSN: [id number]    Date of Birth: 13-Oct-1965  Age: 53 y.o.  Sex: female      Other Providers:  Dr. Jenetta Loges    DIAGNOSIS: pT1c pN0 M0 R breast IDC, ER/PR+, Her2-    PREVIOUS TREATMENT:  1. 05/04/2018: R lumpectomy and SLN bx  2. 08/15/2018: Radiation to right breast. Dose: 4256 cGy in 16 fractions right breast, 1000 cGy in 4 fractions right breast boost.  3. Currently on Tamoxifen    INTERVAL HISTORY:  Jill Kaufman is a 53 y.o. female who is here for routine f/u. She recently had a mammogram and ultrasound which revealed no worrisome findings. She reports that she has occasional pain in the right breast which comes and goes. She denies nipple retraction or skin dimpling. She feels as if her R nipple is darker compared to the L.     UPDATED PAST MEDICAL HISTORY:  Since our prior encounter, Jill Kaufman has not been hospitalized.  There have been no significant changes to the medical history.     MEDICATIONS:     Current Outpatient Medications:   ???  pitavastatin calcium (Livalo) 1 mg tab tablet, Take 1 Tab by mouth daily., Disp: 30 Tab, Rfl: 5  ???  tamoxifen (NOLVADEX) 20 mg tablet, Take 1 Tab by mouth daily. Indications: hormone receptor positive breast cancer, Disp: 90 Tab, Rfl: 3  ???  levothyroxine (SYNTHROID) 75 mcg tablet, Take 1 Tab by mouth Daily (before breakfast)., Disp: 90 Tab, Rfl: 3    ALLERGIES:   Allergies   Allergen Reactions   ??? Codeine Vertigo       PHYSICAL EXAMINATION:   ECOG Performance status 0  VITAL SIGNS:   Visit Vitals  BP (!) 138/96   Pulse 65   Temp 97 ??F (36.1 ??C)   Wt 68.4 kg (150 lb 11.2 oz)   SpO2 96%   BMI 26.28 kg/m??      General: well developed/nourished adult Female in no acute distress; appears stated age  72: normocephalic, atraumatic; EOMI  Neck: supple with full ROM;   Respiratory: normal inspiratory effort, no audible wheezes  Extremities: no cyanosis, clubbing, or edema  Musculoskeletal: mobility intact x4; normal ROM in all joints   Skin: no skin lesions identified  Neuro: AOx3; sensation intact x 4; CNII-XII grossly intact  Psych: appropriate affect, insight, and judgement  GI: abdomen soft, non-distended  Breast: No masses palpated on R; mild telangiectasias on R.    LABORATORY:   Lab Results   Component Value Date/Time    Sodium 139 02/06/2019 09:10 AM    Potassium 4.4 02/06/2019 09:10 AM    Chloride 101 02/06/2019 09:10 AM    CO2 23 02/06/2019 09:10 AM    Anion gap 7 10/31/2018 12:39 PM    Glucose 91 02/06/2019 09:10 AM    BUN 17 02/06/2019 09:10 AM    Creatinine 0.86 02/06/2019 09:10 AM    GFR est AA 89 02/06/2019 09:10 AM    GFR est non-AA 77 02/06/2019 09:10 AM    Calcium 9.1 02/06/2019 09:10 AM    Albumin 4.4 02/06/2019 09:10 AM    Protein, total 7.0 02/06/2019 09:10 AM    Globulin 4.0 (H) 10/31/2018 12:39 PM    A-G Ratio 1.7 02/06/2019 09:10 AM    ALT (SGPT) 27 02/06/2019 09:10 AM     Lab Results   Component Value Date/Time    WBC 7.6 10/31/2018 12:39 PM    HGB 14.0 10/31/2018  12:39 PM    HCT 41.0 10/31/2018 12:39 PM    PLATELET 239 10/31/2018 12:39 PM       RADIOLOGY:  Mam Mammo Bi Dx Incl Cad    Result Date: 03/18/2019  DIAGNOSTIC DIGITAL BILATERAL MAMMOGRAPHY AND right BREAST ULTRASOUND CLINICAL INDICATION: Annual bilateral mammogram, follow-up after right lumpectomy for 9:00 breast cancer in November 2019 and subsequent radiation. Patient reports chronic intermittent moderate pain in her right breast axillary tail and axilla, since the radiation treatment. No family breast cancer. COMPARISON: Mammography 05/04/2018, 03/13/2018, others going back to 07/06/2010. Also MRI breast 03/26/2018. FINDINGS: Mammogram: Digital diagnostic mammography was performed, and is interpreted in conjunction with a computer assisted detection (CAD) system.  Standard views bilaterally demonstrate heterogeneously dense glandular tissues which limits the sensitivity of mammography. Additional right  mediolateral, exaggerated CC, and compression magnification views were performed. There are diffuse bilateral typically benign calcifications which have not significantly changed from older examinations. On the right there is mild expected scarring in the posterior lateral surgical site, and there is mild diffuse skin thickening in keeping with surgery and radiation. No suspicious developing mass, nonsurgical distortion, or focal suspicious calcification cluster is evident on either side. Recommend ultrasound to evaluate the right areas of pain. Ultrasound: Real time targeted sonography was performed of the right axillary tail and right axilla by the radiologist and sonographer. There are normal tissue elements without evidence of a cystic or solid mass, or lymphadenopathy. A few small morphologically normal right axillary lymph nodes are noted.     IMPRESSION: 1. Right expected lumpectomy and radiation change. 2. No evidence of residual or recurrent malignancy in either breast. Recommend clinical follow-up for pain. She may return for bilateral screening mammography in one year.  3. Follow-up bilateral breast MRI is recommended within the next 6 months for new baseline evaluation, supplemental screening, and surveillance given her dense breast parenchyma and increased risk (with personal breast cancer history). Results today were reported to the patient at the time of interpretation. BI-RADS Assessment Category 2: Benign finding BD3 We are including breast density information for the patient along with the mammogram report.     US Breast Axilla Rt    Result Date: 03/18/2019  DIAGNOSTIC DIGITAL BILATERAL MAMMOGRAPHY AND right BREAST ULTRASOUND CLINICAL INDICATION: Annual bilateral mammogram, follow-up after right lumpectomy for 9:00 breast cancer in November 2019 and subsequent radiation. Patient reports chronic intermittent moderate pain in her right  breast axillary tail and axilla, since the radiation treatment. No family breast cancer. COMPARISON: Mammography 05/04/2018, 03/13/2018, others going back to 07/06/2010. Also MRI breast 03/26/2018. FINDINGS: Mammogram: Digital diagnostic mammography was performed, and is interpreted in conjunction with a computer assisted detection (CAD) system.  Standard views bilaterally demonstrate heterogeneously dense glandular tissues which limits the sensitivity of mammography. Additional right mediolateral, exaggerated CC, and compression magnification views were performed. There are diffuse bilateral typically benign calcifications which have not significantly changed from older examinations. On the right there is mild expected scarring in the posterior lateral surgical site, and there is mild diffuse skin thickening in keeping with surgery and radiation. No suspicious developing mass, nonsurgical distortion, or focal suspicious calcification cluster is evident on either side. Recommend ultrasound to evaluate the right areas of pain. Ultrasound: Real time targeted sonography was performed of the right axillary tail and right axilla by the radiologist and sonographer. There are normal tissue elements without evidence of a cystic or solid mass, or lymphadenopathy. A few small morphologically normal right axillary lymph nodes are  noted.     IMPRESSION: 1. Right expected lumpectomy and radiation change. 2. No evidence of residual or recurrent malignancy in either breast. Recommend clinical follow-up for pain. She may return for bilateral screening mammography in one year.  3. Follow-up bilateral breast MRI is recommended within the next 6 months for new baseline evaluation, supplemental screening, and surveillance given her dense breast parenchyma and increased risk (with personal breast cancer history). Results today were reported to the patient at the time of interpretation. BI-RADS Assessment  Category 2: Benign finding BD3 We are including breast density information for the patient along with the mammogram report.     TUMOR STATUS:  NED    IMPRESSION:  Jill Kaufman is a 53 y.o. female with a history of stage IA R breast IDC s/p lumpectomy and SLN bx and radiation. Now on Tamoxifen.    PLAN:    1) Patient is recovering as anticipated from her prior course of radiotherapy.  2) Future follow up will be coordinated with other providers.      Portions of this note were copied from prior encounters and reviewed for accuracy, currency, and represent documentation and tasks completed during this encounter.  I verify and attest these portions to be unchanged from prior visits.      Osa Craver, MD  03/20/19

## 2019-03-20 NOTE — Progress Notes (Addendum)
Pt here for FUP Post RT to the right breast which ended 08/15/18.  She is taking Tamoxifen as ordered and is followed by Dr. Jenetta Loges in [place].  Pt is s/p a right lumpectomy on 05/04/18.  The 03/18/19 Mammogram was negative.  Pt stated that she has no complaints or concerns today.  Dr. Nance Pew left FUP up to pt whether to come back to [place], or simply to continue with Dr. Jenetta Loges exclusively.  Pt will call us if she needs [place].  Dr. Nance Pew will defer ordering a MRI breast in 6 months to Dr. Jenetta Loges if it is needed.

## 2019-05-01 MED FILL — TAMOXIFEN CITRATE 20MG TABS: 20 mg | 90 days supply | Qty: 90 | Fill #2 | Status: AC

## 2019-05-03 ENCOUNTER — Ambulatory Visit: Attending: Internal Medicine | Primary: Family Medicine

## 2019-05-03 ENCOUNTER — Ambulatory Visit
Admit: 2019-05-03 | Discharge: 2019-05-03 | Payer: PRIVATE HEALTH INSURANCE | Attending: Internal Medicine | Primary: Family Medicine

## 2019-05-03 ENCOUNTER — Inpatient Hospital Stay: Admit: 2019-05-03 | Payer: PRIVATE HEALTH INSURANCE | Primary: Family Medicine

## 2019-05-03 DIAGNOSIS — C50111 Malignant neoplasm of central portion of right female breast: Secondary | ICD-10-CM

## 2019-05-03 DIAGNOSIS — Z170 Estrogen receptor positive status [ER+]: Secondary | ICD-10-CM

## 2019-05-03 LAB — METABOLIC PANEL, COMPREHENSIVE
A-G Ratio: 1 — ABNORMAL LOW (ref 1.2–3.5)
ALT (SGPT): 32 U/L (ref 12–65)
AST (SGOT): 14 U/L — ABNORMAL LOW (ref 15–37)
Albumin: 3.8 g/dL (ref 3.5–5.0)
Alk. phosphatase: 57 U/L (ref 50–136)
Anion gap: 7 mmol/L (ref 7–16)
BUN: 15 MG/DL (ref 6–23)
Bilirubin, total: 0.4 MG/DL (ref 0.2–1.1)
CO2: 26 mmol/L (ref 21–32)
Calcium: 8.9 MG/DL (ref 8.3–10.4)
Chloride: 106 mmol/L (ref 98–107)
Creatinine: 1 MG/DL (ref 0.6–1.0)
GFR est AA: >60 mL/min/{1.73_m2} (ref >60)
GFR est non-AA: >60 mL/min/{1.73_m2} (ref >60)
Globulin: 3.7 g/dL — ABNORMAL HIGH (ref 2.3–3.5)
Glucose: 155 mg/dL — ABNORMAL HIGH (ref 65–100)
Potassium: 3.6 mmol/L (ref 3.5–5.1)
Protein, total: 7.5 g/dL (ref 6.3–8.2)
Sodium: 139 mmol/L (ref 136–145)

## 2019-05-03 LAB — CBC WITH AUTOMATED DIFF
ABS. BASOPHILS: 0.1 10*3/uL (ref 0.0–0.2)
ABS. EOSINOPHILS: 0.1 10*3/uL (ref 0.0–0.8)
ABS. IMM. GRANS.: 0 10*3/uL (ref 0.0–0.5)
ABS. LYMPHOCYTES: 1.4 10*3/uL (ref 0.5–4.6)
ABS. MONOCYTES: 0.2 10*3/uL (ref 0.1–1.3)
ABS. NEUTROPHILS: 4.3 10*3/uL (ref 1.7–8.2)
ABSOLUTE NRBC: 0 10*3/uL (ref 0.0–0.2)
BASOPHILS: 1 % (ref 0.0–2.0)
EOSINOPHILS: 1 % (ref 0.5–7.8)
HCT: 37.6 % (ref 35.8–46.3)
HGB: 13.1 g/dL (ref 11.7–15.4)
IMMATURE GRANULOCYTES: 0 % (ref 0.0–5.0)
LYMPHOCYTES: 23 % (ref 13–44)
MCH: 30.3 PG (ref 26.1–32.9)
MCHC: 34.8 g/dL (ref 31.4–35.0)
MCV: 87 FL (ref 79.6–97.8)
MONOCYTES: 4 % (ref 4.0–12.0)
MPV: 10.2 FL (ref 9.4–12.3)
NEUTROPHILS: 71 % (ref 43–78)
PLATELET: 241 10*3/uL (ref 150–450)
RBC: 4.32 M/uL (ref 4.05–5.25)
RDW: 12.3 % (ref 11.9–14.6)
WBC: 6.1 10*3/uL (ref 4.3–11.1)

## 2019-05-03 LAB — COMPREHENSIVE METABOLIC PANEL
ALT: 32 U/L (ref 12–65)
AST: 14 U/L — ABNORMAL LOW (ref 15–37)
Albumin/Globulin Ratio: 1 — ABNORMAL LOW (ref 1.2–3.5)
Albumin: 3.8 g/dL (ref 3.5–5.0)
Alkaline Phosphatase: 57 U/L (ref 50–136)
Anion Gap: 7 mmol/L (ref 7–16)
BUN: 15 MG/DL (ref 6–23)
CO2: 26 mmol/L (ref 21–32)
Calcium: 8.9 MG/DL (ref 8.3–10.4)
Chloride: 106 mmol/L (ref 98–107)
Creatinine: 1 MG/DL (ref 0.6–1.0)
EGFR IF NonAfrican American: >60 mL/min/{1.73_m2} (ref >60)
GFR African American: >60 mL/min/{1.73_m2} (ref >60)
Globulin: 3.7 g/dL — ABNORMAL HIGH (ref 2.3–3.5)
Glucose: 155 mg/dL — ABNORMAL HIGH (ref 65–100)
Potassium: 3.6 mmol/L (ref 3.5–5.1)
Sodium: 139 mmol/L (ref 136–145)
Total Bilirubin: 0.4 MG/DL (ref 0.2–1.1)
Total Protein: 7.5 g/dL (ref 6.3–8.2)

## 2019-05-03 LAB — CBC WITH AUTO DIFFERENTIAL
Basophils %: 1 % (ref 0.0–2.0)
Basophils Absolute: 0.1 10*3/uL (ref 0.0–0.2)
Eosinophils %: 1 % (ref 0.5–7.8)
Eosinophils Absolute: 0.1 10*3/uL (ref 0.0–0.8)
Granulocyte Absolute Count: 0 10*3/uL (ref 0.0–0.5)
Hematocrit: 37.6 % (ref 35.8–46.3)
Hemoglobin: 13.1 g/dL (ref 11.7–15.4)
Immature Granulocytes: 0 % (ref 0.0–5.0)
Lymphocytes %: 23 % (ref 13–44)
Lymphocytes Absolute: 1.4 10*3/uL (ref 0.5–4.6)
MCH: 30.3 PG (ref 26.1–32.9)
MCHC: 34.8 g/dL (ref 31.4–35.0)
MCV: 87 FL (ref 79.6–97.8)
MPV: 10.2 FL (ref 9.4–12.3)
Monocytes %: 4 % (ref 4.0–12.0)
Monocytes Absolute: 0.2 10*3/uL (ref 0.1–1.3)
NRBC Absolute: 0 10*3/uL (ref 0.0–0.2)
Neutrophils %: 71 % (ref 43–78)
Neutrophils Absolute: 4.3 10*3/uL (ref 1.7–8.2)
Platelets: 241 10*3/uL (ref 150–450)
RBC: 4.32 M/uL (ref 4.05–5.25)
RDW: 12.3 % (ref 11.9–14.6)
WBC: 6.1 10*3/uL (ref 4.3–11.1)

## 2019-05-03 NOTE — Progress Notes (Signed)
Progress Notes by Greer Ee, MD at 05/03/19 1430                Author: Greer Ee, MD  Service: --  Author Type: Physician       Filed: 05/03/19 1448  Encounter Date: 05/03/2019  Status: Signed          Editor: Greer Ee, MD (Physician)               [facility] Hematology and Oncology: Office Visit Established Patient      Chief Complaint:       Chief Complaint       Patient presents with        ?  Follow-up        History of Present Illness:   Jill Kaufman is a  53 y.o. female who presents today for evaluation regarding breast cancer.  She underwent  routine mammogram in September 2019 which showed an irregular mass density in the upper outer right breast.  She underwent ultrasound and biopsy which showed invasive ductal carcinoma, grade 2, ER 95%, PR 95%, HER2 negative.  MRI showed the lesion to  be 1.5 cm with no other abnormalities.  She was referred to Dr. Domenica Fail and underwent lumpectomy and sentinel node biopsy on 05/04/18 which showed 1.6 cm of tumor with negative margins and 1 sentinel lymph node negative for tumor.  Oncotype Dx testing  was low risk at 6; therefore, chemotherapy can be safely deferred.  We will refer her for radiation therapy, then begin endocrine therapy.  She has completed hysterectomy but her ovaries are intact,  her FSH/LH/E2 levels were borderline so we felt it would be prudent to proceed with tamoxifen for 2-3 years, then transition to AI once she is clearly post-menopausal.        Here for follow-up.  Doing well, no new complaints.  She has rare right axillary discomfort that is post-surgical, but this is not overly bothersome.  No other symptoms, tamoxifen is going well otherwise.  No significant symptoms from therapy.           Review of Systems:   Constitutional: Negative.    HENT: Negative.    Eyes: Negative.    Respiratory: Negative.    Cardiovascular: Negative.    Gastrointestinal: Negative.    Genitourinary: Negative.    Musculoskeletal: Negative.     Skin: Negative.    Neurological: Negative.    Endo/Heme/Allergies: Negative.    Psychiatric/Behavioral: Negative.    All other systems reviewed and are negative.         Allergies        Allergen  Reactions         ?  Codeine  Vertigo          Past Medical History:        Diagnosis  Date         ?  Breast cancer ([place])            T1cN0 right breast cancer         ?  GERD (gastroesophageal reflux disease)            OTC meds as needed          ?  High cholesterol       ?  Nausea & vomiting       ?  Thyroid disease  06/14/2011          Hypothyroidism  Past Surgical History:         Procedure  Laterality  Date          ?  HX BREAST BIOPSY  Right  05/04/2018          RIGHT BREAST NEEDLE LOCALIZED BIOPSY performed by Lorenda Cahill, MD at [place]          ?  HX BREAST LUMPECTOMY  Right  05/04/2018          RIGHT BREAST LUMPECTOMY performed by Lorenda Cahill, MD at [place]          ?  HX GYN              hysterectomy partial-still has ovaries          Family History         Problem  Relation  Age of Onset          ?  Heart Attack  Mother       ?  Cancer  Father                throat cancer, skin cancer           ?  Diabetes  Father            ?  Heart Attack  Father            Social History          Socioeconomic History         ?  Marital status:  MARRIED              Spouse name:  Not on file         ?  Number of children:  Not on file     ?  Years of education:  Not on file     ?  Highest education level:  Not on file       Occupational History        ?  Not on file       Social Needs         ?  Financial resource strain:  Not on file        ?  Food insecurity              Worry:  Not on file         Inability:  Not on file        ?  Transportation needs              Medical:  Not on file         Non-medical:  Not on file       Tobacco Use         ?  Smoking status:  Never Smoker     ?  Smokeless tobacco:  Never Used       Substance and Sexual Activity         ?  Alcohol use:  Yes              Comment: occassional         ?  Drug use:  Not Currently     ?  Sexual activity:  Not on file       Lifestyle        ?  Physical activity              Days per week:  Not on file  Minutes per session:  Not on file         ?  Stress:  Not on file       Relationships        ?  Social Health visitor on phone:  Not on file         Gets together:  Not on file         Attends religious service:  Not on file         Active member of club or organization:  Not on file         Attends meetings of clubs or organizations:  Not on file         Relationship status:  Not on file        ?  Intimate partner violence              Fear of current or ex partner:  Not on file         Emotionally abused:  Not on file         Physically abused:  Not on file         Forced sexual activity:  Not on file        Other Topics  Concern         ?  Military Service  Not Asked     ?  Blood Transfusions  Not Asked     ?  Caffeine Concern  Not Asked     ?  Occupational Exposure  Not Asked     ?  Hobby Hazards  Not Asked     ?  Sleep Concern  Not Asked     ?  Stress Concern  Not Asked     ?  Weight Concern  Not Asked     ?  Special Diet  Not Asked     ?  Back Care  Not Asked     ?  Exercise  Not Asked     ?  Bike Helmet  Not Asked     ?  Seat Belt  Not Asked     ?  Self-Exams  Not Asked       Social History Narrative        ?  Not on file          Current Outpatient Medications          Medication  Sig  Dispense  Refill           ?  Soolantra 1 % crea           ?  lovastatin (MEVACOR) 10 mg tablet           ?  tamoxifen (NOLVADEX) 20 mg tablet  Take 1 Tab by mouth daily. Indications: hormone receptor positive breast cancer  90 Tab  3     ?  levothyroxine (SYNTHROID) 75 mcg tablet  Take 1 Tab by mouth Daily (before breakfast).  90 Tab  3           ?  pitavastatin calcium (Livalo) 1 mg tab tablet  Take 1 Tab by mouth daily.  30 Tab  5           OBJECTIVE:   Visit Vitals       BP  131/80  Comment: standing        Pulse  80      Temp  98.3 ??F (36.8 ??C) (Oral)     Resp  16     Ht  5' 3"  (1.6 m)     Wt  150 lb (68 kg)     SpO2  96%        BMI  26.57 kg/m??               Constitutional:  Well developed, well nourished female in no acute  distress, sitting comfortably in the exam room chair.         HEENT:  Normocephalic and atraumatic. Oropharynx is clear, mucous membranes are moist.  Sclerae anicteric. Neck supple without JVD. No thyromegaly present.         Lymph node     No palpable submandibular, cervical, supraclavicular, axillary lymph nodes.     Skin  Warm and dry.  No bruising and no rash noted.  No erythema.  No pallor.      Respiratory  Lungs are clear to auscultation bilaterally without wheezes, rales or rhonchi, normal air exchange without accessory muscle use.      CVS  Normal rate, regular rhythm and normal S1 and S2.  No murmurs, gallops, or rubs.     Abdomen  Soft, nontender and nondistended, normoactive bowel sounds.  No palpable mass.  No hepatosplenomegaly.     Neuro  Grossly nonfocal with no obvious sensory or motor deficits.     MSK  Normal range of motion in general.  No edema and no tenderness.     Psych  Appropriate mood and affect.          Labs:     Recent Results (from the past 24 hour(s))     CBC WITH AUTOMATED DIFF          Collection Time: 05/03/19  2:07 PM         Result  Value  Ref Range            WBC  6.1  4.3 - 11.1 K/uL       RBC  4.32  4.05 - 5.25 M/uL       HGB  13.1  11.7 - 15.4 g/dL       HCT  37.6  35.8 - 46.3 %       MCV  87.0  79.6 - 97.8 FL       MCH  30.3  26.1 - 32.9 PG       MCHC  34.8  31.4 - 35.0 g/dL       RDW  12.3  11.9 - 14.6 %       PLATELET  241  150 - 450 K/uL       MPV  10.2  9.4 - 12.3 FL       ABSOLUTE NRBC  0.00  0.0 - 0.2 K/uL       NEUTROPHILS  71  43 - 78 %       LYMPHOCYTES  23  13 - 44 %       MONOCYTES  4  4.0 - 12.0 %       EOSINOPHILS  1  0.5 - 7.8 %       BASOPHILS  1  0.0 - 2.0 %       IMMATURE GRANULOCYTES  0  0.0 - 5.0 %       ABS. NEUTROPHILS  4.3  1.7 - 8.2 K/UL        ABS. LYMPHOCYTES  1.4  0.5 - 4.6  K/UL       ABS. MONOCYTES  0.2  0.1 - 1.3 K/UL       ABS. EOSINOPHILS  0.1  0.0 - 0.8 K/UL            ABS. BASOPHILS  0.1  0.0 - 0.2 K/UL            ABS. IMM. GRANS.  0.0  0.0 - 0.5 K/UL       DF  AUTOMATED          METABOLIC PANEL, COMPREHENSIVE          Collection Time: 05/03/19  2:07 PM         Result  Value  Ref Range            Sodium  139  136 - 145 mmol/L       Potassium  3.6  3.5 - 5.1 mmol/L       Chloride  106  98 - 107 mmol/L       CO2  26  21 - 32 mmol/L       Anion gap  7  7 - 16 mmol/L       Glucose  155 (H)  65 - 100 mg/dL       BUN  15  6 - 23 MG/DL       Creatinine  1.00  0.6 - 1.0 MG/DL       GFR est AA  >60  >60 ml/min/1.55m       GFR est non-AA  >60  >60 ml/min/1.736m      Calcium  8.9  8.3 - 10.4 MG/DL       Bilirubin, total  0.4  0.2 - 1.1 MG/DL       ALT (SGPT)  32  12 - 65 U/L       AST (SGOT)  14 (L)  15 - 37 U/L       Alk. phosphatase  57  50 - 136 U/L       Protein, total  7.5  6.3 - 8.2 g/dL       Albumin  3.8  3.5 - 5.0 g/dL       Globulin  3.7 (H)  2.3 - 3.5 g/dL            A-G Ratio  1.0 (L)  1.2 - 3.5             Imaging:   MRI of the Breasts with and without contrast   ??   CLINICAL INDICATION:  New diagnosis of right 9:00 breast cancer status post   biopsy on 10/1/2019at [facility] demonstrating invasive ductal   carcinoma grade 2. Evaluate for extent of disease, staging, therapy planning. No   reported personal malignancy or breast surgery otherwise, and no family breast   or ovarian cancer.   ??   COMPARISON: Outside mammography and ultrasound from [facility] 03/13/2018, 03/07/2018, 02/27/2018   ??   TECHNIQUE: Standard MRI sequences were obtained through the breasts in multiple   planes. Images were obtained before and after intravenous infusion of 13 mL of   Dotarem contrast. Images were reviewed with PACS and with Dynacad CAD software.   ??   FINDINGS: The breasts demonstrate moderate bilateral glandularity, and  moderate   bilateral background enhancement which could limit accuracy. There are   innumerable tiny enhancing foci and a few scattered small cysts on each side.   ??  Right lateral 9:00 biopsy site demonstrates a clip within an irregular   heterogeneously enhancing mass measuring up to 1.3 x 1.0 x 1.5 cm.   ??   There is no evidence of suspicious enhancing mass, and no dominant or unique   nonmass enhancement, to suggest additional malignancy in either breast.  There   is no evidence of axillary or internal mammary lymphadenopathy. Chest wall   signal is normal.    ??   ??   IMPRESSION   IMPRESSION:    1. Right 9:00 breast cancer measures up to 1.5 cm.   2. No additional discrete abnormality in either breast. Moderate background   enhancement.   3. No evidence of lymphadenopathy or chest wall abnormalities.   ??   Recommend continued management as directed clinically. Annual bilateral   mammogram will be due in September 2020. This patient would likely benefit from   supplemental breast MRI in one year as well.   ??   BI-RADS Assessment Category 6: Known Biopsy Proven Malignancy   ??   ??   Pathology:                      ASSESSMENT:             ICD-10-CM  ICD-9-CM             1.  Malignant neoplasm of central portion of right breast in female, estrogen receptor positive ([place])   C50.111  174.1  CBC WITH AUTOMATED DIFF            S01.0  X32.3  METABOLIC PANEL, COMPREHENSIVE           Problem List   Date Reviewed:  08-Mar-2019                        Codes  Class  Noted             Abnormal breast finding  ICD-10-CM: N64.59   ICD-9-CM: 796.4    11/01/2018                       S/P lumpectomy of breast  ICD-10-CM: Z98.890   ICD-9-CM: V45.89    05/17/2018                       Malignant neoplasm of central portion of right breast in female, estrogen receptor positive ([place])  ICD-10-CM: C50.111, Z17.0   ICD-9-CM: 174.1, V86.0    04/12/2018                             PLAN:   Lab studies were personally reviewed.      Breast cancer:  1.6 cm, grade 2 IDC, sentinel lymph node biopsy negative, ER 95%, PR 95%, HER2 negative.  S/p lumpectomy with negative margins.  She has completed definitive surgery.  Because of  the ER/PR positivity, the negative nodal assessment, and the T1 tumor, she would be an excellent candidate for Oncotype Dx for risk stratification, which was low risk at 6.  Therefore, chemotherapy can be safely deferred.  We will refer her for radiation  therapy, then begin endocrine therapy.  She has completed hysterectomy but her ovaries are intact, her FSH/LH/E2 levels were borderline so we felt it would be prudent to proceed with tamoxifen for 2-3 years, then transition to AI once she is clearly post-menopausal.  We discussed side effects of tamoxifen including the risk of VTE, as well as vasomotor symptoms and arthralgias.  She started tamoxifen after radiation was completed.        Here for follow-up.  Doing well, no new complaints.  She has rare right axillary discomfort that is post-surgical, but this is not overly bothersome.  No other symptoms, tamoxifen is going well otherwise.  No significant symptoms from therapy.  Labs reviewed  and unremarkable.  Mammogram and ultrasound October 2020 showed no suspicious findings, repeat screening mammogram October 2021.  We also discussed MRI for screening, I would wait until 18 months from radiation prior to considering this, approximately  October 2021.  Continue tamoxifen without change, recheck endocrine labs in 2022 to monitor for menopause and consider change to AI.  All questions were asked and answered to the best of my ability.  F/u in 6 months.                   Greer Ee, MD   [facility] [place] [address]   Office : [phone]   Fax : [phone]

## 2019-05-03 NOTE — Progress Notes (Signed)
05/03/2019 Saw the patient with Dr Jenetta Loges. She is doing well on the Tamoxifen. She will be due for MRI in the fall. We discussed the treatment summary and this will be mailed to her. Navigation will sign off.

## 2019-05-03 NOTE — Patient Instructions (Signed)
Patient Instructions from Today's Visit    Reason for Visit:  Breast Cancer     Diagnosis Information:  https://www.cancer.net/about-us/asco-answers-patient-education-materials/asco-answers-fact-sheets    Plan:  Continue on the Tamoxifen    Follow Up:  As scheduled    Recent Lab Results:  Recent Results (from the past 12 hour(s))   CBC WITH AUTOMATED DIFF    Collection Time: 05/03/19  2:07 PM   Result Value Ref Range    WBC 6.1 4.3 - 11.1 K/uL    RBC 4.32 4.05 - 5.25 M/uL    HGB 13.1 11.7 - 15.4 g/dL    HCT 37.6 35.8 - 46.3 %    MCV 87.0 79.6 - 97.8 FL    MCH 30.3 26.1 - 32.9 PG    MCHC 34.8 31.4 - 35.0 g/dL    RDW 12.3 11.9 - 14.6 %    PLATELET 241 150 - 450 K/uL    MPV 10.2 9.4 - 12.3 FL    ABSOLUTE NRBC 0.00 0.0 - 0.2 K/uL    NEUTROPHILS 71 43 - 78 %    LYMPHOCYTES 23 13 - 44 %    MONOCYTES 4 4.0 - 12.0 %    EOSINOPHILS 1 0.5 - 7.8 %    BASOPHILS 1 0.0 - 2.0 %    IMMATURE GRANULOCYTES 0 0.0 - 5.0 %    ABS. NEUTROPHILS 4.3 1.7 - 8.2 K/UL    ABS. LYMPHOCYTES 1.4 0.5 - 4.6 K/UL    ABS. MONOCYTES 0.2 0.1 - 1.3 K/UL    ABS. EOSINOPHILS 0.1 0.0 - 0.8 K/UL    ABS. BASOPHILS 0.1 0.0 - 0.2 K/UL    ABS. IMM. GRANS. 0.0 0.0 - 0.5 K/UL    DF AUTOMATED     METABOLIC PANEL, COMPREHENSIVE    Collection Time: 05/03/19  2:07 PM   Result Value Ref Range    Sodium 139 136 - 145 mmol/L    Potassium 3.6 3.5 - 5.1 mmol/L    Chloride 106 98 - 107 mmol/L    CO2 26 21 - 32 mmol/L    Anion gap 7 7 - 16 mmol/L    Glucose 155 (H) 65 - 100 mg/dL    BUN 15 6 - 23 MG/DL    Creatinine 1.00 0.6 - 1.0 MG/DL    GFR est AA >60 >60 ml/min/1.38m    GFR est non-AA >60 >60 ml/min/1.768m   Calcium 8.9 8.3 - 10.4 MG/DL    Bilirubin, total 0.4 0.2 - 1.1 MG/DL    ALT (SGPT) 32 12 - 65 U/L    AST (SGOT) 14 (L) 15 - 37 U/L    Alk. phosphatase 57 50 - 136 U/L    Protein, total 7.5 6.3 - 8.2 g/dL    Albumin 3.8 3.5 - 5.0 g/dL    Globulin 3.7 (H) 2.3 - 3.5 g/dL    A-G Ratio 1.0 (L) 1.2 - 3.5            Treatment Summary has been discussed and given to patient: discussed and I will mail this to you        -------------------------------------------------------------------------------------------------------------------  Please call our office at ([phone] you have any  of the following symptoms:   ?? Fever of 100.5 or greater  ?? Chills  ?? Shortness of breath  ?? Swelling or pain in one leg    After office hours an answering service is available and will contact a provider for emergencies or if you are experiencing any of the  above symptoms.    ? Patient does express an interest in My Chart.  My Chart log in information explained on the after visit summary printout at the [place] desk.    From Nunzio Cory RN,BSN  Nurse Navigator  Cell [phone]  [email or web address] .Radonna Ricker

## 2019-05-03 NOTE — Progress Notes (Signed)
[facility]: Office Visit Established Patient    Chief Complaint:    Chief Complaint   Patient presents with   ??? Follow-up     History of Present Illness:  [name] is a 53 y.o. female who presents today for evaluation regarding breast cancer.  She underwent routine mammogram in September 2019 which showed an irregular mass density in the upper outer right breast.  She underwent ultrasound and biopsy which showed invasive ductal carcinoma, grade 2, ER 95%, PR 95%, HER2 negative.  MRI showed the lesion to be 1.5 cm with no other abnormalities.  She was referred to Dr. Domenica Fail and underwent lumpectomy and sentinel node biopsy on 05/04/18 which showed 1.6 cm of tumor with negative margins and 1 sentinel lymph node negative for tumor.  Oncotype Dx testing was low risk at 6; therefore, chemotherapy can be safely deferred.  We will refer her for radiation therapy, then begin endocrine therapy.  She has completed hysterectomy but her ovaries are intact, her FSH/LH/E2 levels were borderline so we felt it would be prudent to proceed with tamoxifen for 2-3 years, then transition to AI once she is clearly post-menopausal.      Here for follow-up.  Doing well, no new complaints.  She has rare right axillary discomfort that is post-surgical, but this is not overly bothersome.  No other symptoms, tamoxifen is going well otherwise.  No significant symptoms from therapy.         Review of Systems:  Constitutional: Negative.   HENT: Negative.   Eyes: Negative.   Respiratory: Negative.   Cardiovascular: Negative.   Gastrointestinal: Negative.   Genitourinary: Negative.   Musculoskeletal: Negative.   Skin: Negative.   Neurological: Negative.   Endo/Heme/Allergies: Negative.   Psychiatric/Behavioral: Negative.   All other systems reviewed and are negative.     Allergies   Allergen Reactions   ??? Codeine Vertigo     Past Medical History:   Diagnosis Date   ??? Breast cancer ([place])     T1cN0 right breast cancer   ???  GERD (gastroesophageal reflux disease)     OTC meds as needed    ??? High cholesterol    ??? Nausea & vomiting    ??? Thyroid disease 06/14/2011    Hypothyroidism      Past Surgical History:   Procedure Laterality Date   ??? HX BREAST BIOPSY Right 05/04/2018    RIGHT BREAST NEEDLE LOCALIZED BIOPSY performed by Lorenda Cahill, MD at [place]   ??? HX BREAST LUMPECTOMY Right 05/04/2018    RIGHT BREAST LUMPECTOMY performed by Lorenda Cahill, MD at [place]   ??? HX GYN      hysterectomy partial-still has ovaries     Family History   Problem Relation Age of Onset   ??? Heart Attack Mother    ??? Cancer Father         throat cancer, skin cancer    ??? Diabetes Father    ??? Heart Attack Father      Social History     Socioeconomic History   ??? Marital status: MARRIED     Spouse name: Not on file   ??? Number of children: Not on file   ??? Years of education: Not on file   ??? Highest education level: Not on file   Occupational History   ??? Not on file   Social Needs   ??? Financial resource strain: Not on file   ???  Food insecurity     Worry: Not on file     Inability: Not on file   ??? Transportation needs     Medical: Not on file     Non-medical: Not on file   Tobacco Use   ??? Smoking status: Never Smoker   ??? Smokeless tobacco: Never Used   Substance and Sexual Activity   ??? Alcohol use: Yes     Comment: occassional   ??? Drug use: Not Currently   ??? Sexual activity: Not on file   Lifestyle   ??? Physical activity     Days per week: Not on file     Minutes per session: Not on file   ??? Stress: Not on file   Relationships   ??? Social Product manager on phone: Not on file     Gets together: Not on file     Attends religious service: Not on file     Active member of club or organization: Not on file     Attends meetings of clubs or organizations: Not on file     Relationship status: Not on file   ??? Intimate partner violence     Fear of current or ex partner: Not on file     Emotionally abused: Not on file     Physically abused: Not on file      Forced sexual activity: Not on file   Other Topics Concern   ??? Military Service Not Asked   ??? Blood Transfusions Not Asked   ??? Caffeine Concern Not Asked   ??? Occupational Exposure Not Asked   ??? Hobby Hazards Not Asked   ??? Sleep Concern Not Asked   ??? Stress Concern Not Asked   ??? Weight Concern Not Asked   ??? Special Diet Not Asked   ??? Back Care Not Asked   ??? Exercise Not Asked   ??? Bike Helmet Not Asked   ??? Seat Belt Not Asked   ??? Self-Exams Not Asked   Social History Narrative   ??? Not on file     Current Outpatient Medications   Medication Sig Dispense Refill   ??? Soolantra 1 % crea      ??? lovastatin (MEVACOR) 10 mg tablet      ??? tamoxifen (NOLVADEX) 20 mg tablet Take 1 Tab by mouth daily. Indications: hormone receptor positive breast cancer 90 Tab 3   ??? levothyroxine (SYNTHROID) 75 mcg tablet Take 1 Tab by mouth Daily (before breakfast). 90 Tab 3   ??? pitavastatin calcium (Livalo) 1 mg tab tablet Take 1 Tab by mouth daily. 30 Tab 5       OBJECTIVE:  Visit Vitals  BP 131/80 Comment: standing   Pulse 80   Temp 98.3 ??F (36.8 ??C) (Oral)   Resp 16   Ht 5' 3"  (1.6 m)   Wt 150 lb (68 kg)   SpO2 96%   BMI 26.57 kg/m??        Constitutional: Well developed, well nourished female in no acute distress, sitting comfortably in the exam room chair.    HEENT: Normocephalic and atraumatic. Oropharynx is clear, mucous membranes are moist.  Sclerae anicteric. Neck supple without JVD. No thyromegaly present.    Lymph node   No palpable submandibular, cervical, supraclavicular, axillary lymph nodes.   Skin Warm and dry.  No bruising and no rash noted.  No erythema.  No pallor.    Respiratory Lungs are clear to auscultation bilaterally without wheezes,  rales or rhonchi, normal air exchange without accessory muscle use.    CVS Normal rate, regular rhythm and normal S1 and S2.  No murmurs, gallops, or rubs.   Abdomen Soft, nontender and nondistended, normoactive bowel sounds.  No palpable mass.  No hepatosplenomegaly.   Neuro Grossly nonfocal  with no obvious sensory or motor deficits.   MSK Normal range of motion in general.  No edema and no tenderness.   Psych Appropriate mood and affect.       Labs:  Recent Results (from the past 24 hour(s))   CBC WITH AUTOMATED DIFF    Collection Time: 05/03/19  2:07 PM   Result Value Ref Range    WBC 6.1 4.3 - 11.1 K/uL    RBC 4.32 4.05 - 5.25 M/uL    HGB 13.1 11.7 - 15.4 g/dL    HCT 37.6 35.8 - 46.3 %    MCV 87.0 79.6 - 97.8 FL    MCH 30.3 26.1 - 32.9 PG    MCHC 34.8 31.4 - 35.0 g/dL    RDW 12.3 11.9 - 14.6 %    PLATELET 241 150 - 450 K/uL    MPV 10.2 9.4 - 12.3 FL    ABSOLUTE NRBC 0.00 0.0 - 0.2 K/uL    NEUTROPHILS 71 43 - 78 %    LYMPHOCYTES 23 13 - 44 %    MONOCYTES 4 4.0 - 12.0 %    EOSINOPHILS 1 0.5 - 7.8 %    BASOPHILS 1 0.0 - 2.0 %    IMMATURE GRANULOCYTES 0 0.0 - 5.0 %    ABS. NEUTROPHILS 4.3 1.7 - 8.2 K/UL    ABS. LYMPHOCYTES 1.4 0.5 - 4.6 K/UL    ABS. MONOCYTES 0.2 0.1 - 1.3 K/UL    ABS. EOSINOPHILS 0.1 0.0 - 0.8 K/UL    ABS. BASOPHILS 0.1 0.0 - 0.2 K/UL    ABS. IMM. GRANS. 0.0 0.0 - 0.5 K/UL    DF AUTOMATED     METABOLIC PANEL, COMPREHENSIVE    Collection Time: 05/03/19  2:07 PM   Result Value Ref Range    Sodium 139 136 - 145 mmol/L    Potassium 3.6 3.5 - 5.1 mmol/L    Chloride 106 98 - 107 mmol/L    CO2 26 21 - 32 mmol/L    Anion gap 7 7 - 16 mmol/L    Glucose 155 (H) 65 - 100 mg/dL    BUN 15 6 - 23 MG/DL    Creatinine 1.00 0.6 - 1.0 MG/DL    GFR est AA >60 >60 ml/min/1.16m    GFR est non-AA >60 >60 ml/min/1.749m   Calcium 8.9 8.3 - 10.4 MG/DL    Bilirubin, total 0.4 0.2 - 1.1 MG/DL    ALT (SGPT) 32 12 - 65 U/L    AST (SGOT) 14 (L) 15 - 37 U/L    Alk. phosphatase 57 50 - 136 U/L    Protein, total 7.5 6.3 - 8.2 g/dL    Albumin 3.8 3.5 - 5.0 g/dL    Globulin 3.7 (H) 2.3 - 3.5 g/dL    A-G Ratio 1.0 (L) 1.2 - 3.5         Imaging:  MRI of the Breasts with and without contrast  ??  CLINICAL INDICATION:  New diagnosis of right 9:00 breast cancer status post  biopsy on 10/1/2019at [facility]  demonstrating invasive ductal  carcinoma grade 2. Evaluate for extent of disease, staging, therapy planning. No  reported  personal malignancy or breast surgery otherwise, and no family breast  or ovarian cancer.  ??  COMPARISON: Outside mammography and ultrasound from [facility]  03/13/2018, 03/07/2018, 02/27/2018  ??  TECHNIQUE: Standard MRI sequences were obtained through the breasts in multiple  planes. Images were obtained before and after intravenous infusion of 13 mL of  Dotarem contrast. Images were reviewed with PACS and with Dynacad CAD software.  ??  FINDINGS: The breasts demonstrate moderate bilateral glandularity, and moderate  bilateral background enhancement which could limit accuracy. There are  innumerable tiny enhancing foci and a few scattered small cysts on each side.  ??  Right lateral 9:00 biopsy site demonstrates a clip within an irregular  heterogeneously enhancing mass measuring up to 1.3 x 1.0 x 1.5 cm.  ??  There is no evidence of suspicious enhancing mass, and no dominant or unique  nonmass enhancement, to suggest additional malignancy in either breast.  There  is no evidence of axillary or internal mammary lymphadenopathy. Chest wall  signal is normal.   ??  ??  IMPRESSION  IMPRESSION:   1. Right 9:00 breast cancer measures up to 1.5 cm.  2. No additional discrete abnormality in either breast. Moderate background  enhancement.  3. No evidence of lymphadenopathy or chest wall abnormalities.  ??  Recommend continued management as directed clinically. Annual bilateral  mammogram will be due in September 2020. This patient would likely benefit from  supplemental breast MRI in one year as well.  ??  BI-RADS Assessment Category 6: Known Biopsy Proven Malignancy  ??  ??  Pathology:            ASSESSMENT:    ICD-10-CM ICD-9-CM    1. 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    Breast cancer: 1.6 cm, grade 2 IDC, sentinel lymph node biopsy negative, ER 95%, PR 95%, HER2 negative.  S/p lumpectomy with negative margins.  She has completed definitive surgery.  Because of the ER/PR positivity, the negative nodal assessment, and the T1 tumor, she would be an excellent candidate for Oncotype Dx for risk stratification, which was low risk at 6.  Therefore, chemotherapy can be safely deferred.  We will refer her for radiation therapy, then begin endocrine therapy.  She has completed hysterectomy but her ovaries are intact, her FSH/LH/E2 levels were borderline so we felt it would be prudent to proceed with tamoxifen for 2-3 years, then transition to AI once she is clearly post-menopausal.  We discussed side effects of tamoxifen including the risk of VTE, as well as vasomotor symptoms and arthralgias.  She started tamoxifen after radiation was completed.      Here for follow-up.  Doing well, no new complaints.  She has rare right axillary discomfort that  is post-surgical, but this is not overly bothersome.  No other symptoms, tamoxifen is going well otherwise.  No significant symptoms from therapy.  Labs reviewed and unremarkable.  Mammogram and ultrasound October 2020 showed no suspicious findings, repeat screening mammogram October 2021.  We also discussed MRI for screening, I would wait until 18 months from radiation prior to considering this, approximately October 2021.  Continue tamoxifen without change, recheck endocrine labs in  2022 to monitor for menopause and consider change to AI.  All questions were asked and answered to the best of my ability.  F/u in 6 months.            Greer Ee, MD  [facility] [place] [address]  Office : [phone]  Fax : [phone]

## 2019-05-13 NOTE — Progress Notes (Signed)
05/13/19 treatment summary care plan completed and mailed to the patient.  Navigation has signed off.

## 2019-07-15 ENCOUNTER — Ambulatory Visit: Primary: Family Medicine

## 2019-07-15 ENCOUNTER — Ambulatory Visit: Payer: PRIVATE HEALTH INSURANCE | Primary: Family Medicine

## 2019-07-15 DIAGNOSIS — Z23 Encounter for immunization: Secondary | ICD-10-CM

## 2019-07-29 MED FILL — TAMOXIFEN CITRATE 20MG TABS: 20 mg | 90 days supply | Qty: 90 | Fill #3 | Status: AC

## 2019-07-31 ENCOUNTER — Other Ambulatory Visit: Admit: 2019-07-31 | Discharge: 2019-07-31 | Payer: PRIVATE HEALTH INSURANCE | Primary: Family Medicine

## 2019-07-31 DIAGNOSIS — Z0000 Encounter for general adult medical examination without abnormal findings: Secondary | ICD-10-CM

## 2019-07-31 LAB — AMB POC COMPLETE CBC,AUTOMATED ENTER
ABS. GRANS (POC): 4.8 10*3/uL (ref 2.0–7.8)
ABS. LYMPHS (POC): 2.1 10*3/uL (ref 0.6–4.1)
GRANULOCYTES (POC): 63.8 % (ref 37.0–92.0)
Granulocytes %, POC: 63.8 % (ref 37.0–92.0)
Granulocytes Abs: 4.8 10*3/uL (ref 2.0–7.8)
HCT (POC): 43.8 % (ref 37.0–51.0)
HGB (POC): 14.3 g/dL (ref 12.0–18.0)
Hematocrit, POC: 43.8 % (ref 37.0–51.0)
Hemoglobin, POC: 14.3 g/dL (ref 12.0–18.0)
LYMPHOCYTES (POC): 28.4 % (ref 10.0–58.5)
Lymphocyte %: 28.4 % (ref 10.0–58.5)
Lymphs Abs: 2.1 10*3/uL (ref 0.6–4.1)
MCH (POC): 30.8 pg (ref 26.0–32.0)
MCH: 30.8 pg (ref 26.0–32.0)
MCHC (POC): 32.6 g/dL (ref 31.0–36.0)
MCHC: 32.6 g/dL (ref 31.0–36.0)
MCV (POC): 94.5 fL (ref 80.0–97.0)
MCV: 94.5 fL (ref 80.0–97.0)
MID% POC: 7.8 % (ref 0.1–24.0)
MPV (POC): 7.8 fL (ref 0.0–49.9)
MPV POC: 7.8 fL (ref 0.0–49.9)
Mid # (POC): 0.6 10*3/uL (ref 0.0–1.8)
Mid Cells %, POC: 7.8 % (ref 0.1–24.0)
Mid Cells Absoulute POC: 0.6 10*3/uL (ref 0.0–1.8)
PLATELET (POC): 320 10*3/uL (ref 140–440)
Platelet Count, POC: 320 10*3/uL (ref 140–440)
RBC (POC): 4.64 10*6/uL (ref 4.20–6.30)
RBC, POC: 4.64 10*6/uL (ref 4.20–6.30)
RDW (POC): 13.6 % (ref 11.5–14.5)
RDW, POC: 13.6 % (ref 11.5–14.5)
WBC (POC): 7.5 10*3/uL (ref 4.1–10.9)
WBC, POC: 7.5 10*3/uL (ref 4.1–10.9)

## 2019-08-01 LAB — METABOLIC PANEL, COMPREHENSIVE
A-G Ratio: 1.5 (ref 1.2–2.2)
ALT (SGPT): 29 IU/L (ref 0–32)
AST (SGOT): 21 IU/L (ref 0–40)
Albumin: 4.4 g/dL (ref 3.8–4.9)
Alk. phosphatase: 70 IU/L (ref 39–117)
BUN/Creatinine ratio: 16 (ref 9–23)
BUN: 16 mg/dL (ref 6–24)
Bilirubin, total: 0.4 mg/dL (ref 0.0–1.2)
CO2: 22 mmol/L (ref 20–29)
Calcium: 9.5 mg/dL (ref 8.7–10.2)
Chloride: 102 mmol/L (ref 96–106)
Creatinine: 0.97 mg/dL (ref 0.57–1.00)
GFR est AA: 77 mL/min/{1.73_m2} (ref >59)
GFR est non-AA: 67 mL/min/{1.73_m2} (ref >59)
GLOBULIN, TOTAL: 2.9 g/dL (ref 1.5–4.5)
Glucose: 88 mg/dL (ref 65–99)
Potassium: 4.3 mmol/L (ref 3.5–5.2)
Protein, total: 7.3 g/dL (ref 6.0–8.5)
Sodium: 141 mmol/L (ref 134–144)

## 2019-08-01 LAB — TSH 3RD GENERATION
TSH: 3.44 u[IU]/mL (ref 0.450–4.500)
TSH: 3.44 u[IU]/mL (ref 0.450–4.500)

## 2019-08-01 LAB — LIPID PANEL
Cholesterol, Total: 212 mg/dL — ABNORMAL HIGH (ref 100–199)
Cholesterol, total: 212 mg/dL — ABNORMAL HIGH (ref 100–199)
HDL Cholesterol: 48 mg/dL (ref >39)
HDL: 48 mg/dL (ref >39)
LDL Calculated: 136 mg/dL — ABNORMAL HIGH (ref 0–99)
LDL, calculated: 136 mg/dL — ABNORMAL HIGH (ref 0–99)
Triglyceride: 158 mg/dL — ABNORMAL HIGH (ref 0–149)
Triglycerides: 158 mg/dL — ABNORMAL HIGH (ref 0–149)
VLDL, calculated: 28 mg/dL (ref 5–40)
VLDL: 28 mg/dL (ref 5–40)

## 2019-08-01 LAB — COMPREHENSIVE METABOLIC PANEL
ALT: 29 IU/L (ref 0–32)
AST: 21 IU/L (ref 0–40)
Albumin/Globulin Ratio: 1.5 NA (ref 1.2–2.2)
Albumin: 4.4 g/dL (ref 3.8–4.9)
Alkaline Phosphatase: 70 IU/L (ref 39–117)
BUN: 16 mg/dL (ref 6–24)
Bun/Cre Ratio: 16 NA (ref 9–23)
CO2: 22 mmol/L (ref 20–29)
Calcium: 9.5 mg/dL (ref 8.7–10.2)
Chloride: 102 mmol/L (ref 96–106)
Creatinine: 0.97 mg/dL (ref 0.57–1.00)
EGFR IF NonAfrican American: 67 mL/min/{1.73_m2} (ref >59)
GFR African American: 77 mL/min/{1.73_m2} (ref >59)
Globulin, Total: 2.9 g/dL (ref 1.5–4.5)
Glucose: 88 mg/dL (ref 65–99)
Potassium: 4.3 mmol/L (ref 3.5–5.2)
Sodium: 141 mmol/L (ref 134–144)
Total Bilirubin: 0.4 mg/dL (ref 0.0–1.2)
Total Protein: 7.3 g/dL (ref 6.0–8.5)

## 2019-08-07 ENCOUNTER — Ambulatory Visit: Attending: Family Medicine | Primary: Family Medicine

## 2019-08-07 ENCOUNTER — Ambulatory Visit
Admit: 2019-08-07 | Discharge: 2019-08-07 | Payer: PRIVATE HEALTH INSURANCE | Attending: Family Medicine | Primary: Family Medicine

## 2019-08-07 DIAGNOSIS — Z0000 Encounter for general adult medical examination without abnormal findings: Secondary | ICD-10-CM

## 2019-08-07 MED ORDER — LOVASTATIN 10 MG TAB
10 mg | ORAL_TABLET | Freq: Every day | ORAL | 3 refills | Status: DC
Start: 2019-08-07 — End: 2020-09-22

## 2019-08-07 MED ORDER — LEVOTHYROXINE 75 MCG TAB
75 mcg | ORAL_TABLET | Freq: Every day | ORAL | 3 refills | Status: DC
Start: 2019-08-07 — End: 2020-08-19

## 2019-08-07 NOTE — Progress Notes (Signed)
SUBJECTIVE:   Jill Kaufman is a 54 y.o. female who is here for CPX patient has a past medical history significant for hypothyroidism, high cholesterol, high triglycerides and breast cancer.  Current medications are listed in the EMR and reviewed today.  Review of systems reveals no complaints of chest pain, shortness of breath, orthopnea or PND.  GI and GU review of systems is unremarkable.    HPI  See above    Past Medical History, Past Surgical History, Family history, Social History, and Medications were all reviewed with the patient today and updated as necessary.       Current Outpatient Medications   Medication Sig Dispense Refill   ??? cholecalciferol, vitamin D3, (VITAMIN D3 PO) Take  by mouth.     ??? lovastatin (MEVACOR) 10 mg tablet Take 1 Tab by mouth daily. 90 Tab 3   ??? levothyroxine (SYNTHROID) 75 mcg tablet Take 1 Tab by mouth Daily (before breakfast). 90 Tab 3   ??? Soolantra 1 % crea      ??? tamoxifen (NOLVADEX) 20 mg tablet Take 1 Tab by mouth daily. Indications: hormone receptor positive breast cancer 90 Tab 3     Allergies   Allergen Reactions   ??? Codeine Vertigo     Patient Active Problem List   Diagnosis Code   ??? Malignant neoplasm of central portion of right breast in female, estrogen receptor positive ([place]) C50.111, Z17.0   ??? S/P lumpectomy of breast Z98.890   ??? Abnormal breast finding N64.59     Past Medical History:   Diagnosis Date   ??? Breast cancer ([place])     T1cN0 right breast cancer   ??? GERD (gastroesophageal reflux disease)     OTC meds as needed    ??? High cholesterol    ??? Nausea & vomiting    ??? Thyroid disease 06/14/2011    Hypothyroidism      Past Surgical History:   Procedure Laterality Date   ??? HX BREAST BIOPSY Right 05/04/2018    RIGHT BREAST NEEDLE LOCALIZED BIOPSY performed by Lorenda Cahill, MD at [place]   ??? HX BREAST LUMPECTOMY Right 05/04/2018    RIGHT BREAST LUMPECTOMY performed by Lorenda Cahill, MD at [place]   ??? HX GYN      hysterectomy partial-still has ovaries      Family History   Problem Relation Age of Onset   ??? Heart Attack Mother    ??? Cancer Father         throat cancer, skin cancer    ??? Diabetes Father    ??? Heart Attack Father      Social History     Tobacco Use   ??? Smoking status: Never Smoker   ??? Smokeless tobacco: Never Used   Substance Use Topics   ??? Alcohol use: Yes     Comment: occassional         Review of Systems  See above    OBJECTIVE:  Visit Vitals  BP 118/70   Ht 5\' 3"  (1.6 m)   Wt 151 lb (68.5 kg)   BMI 26.75 kg/m??        Physical Exam  Constitutional:       Appearance: She is well-developed.   HENT:      Head: Normocephalic and atraumatic.      Right Ear: External ear normal.      Left Ear: External ear normal.      Nose: Nose normal.  Mouth/Throat:      Pharynx: No oropharyngeal exudate.   Eyes:      General: No scleral icterus.        Right eye: No discharge.         Left eye: No discharge.      Pupils: Pupils are equal, round, and reactive to light.   Neck:      Musculoskeletal: Normal range of motion and neck supple.      Thyroid: No thyromegaly.      Vascular: No JVD.      Trachea: No tracheal deviation.   Cardiovascular:      Rate and Rhythm: Normal rate and regular rhythm.      Heart sounds: Normal heart sounds. No murmur. No friction rub. No gallop.    Pulmonary:      Effort: Pulmonary effort is normal. No respiratory distress.      Breath sounds: Normal breath sounds. No wheezing or rales.   Chest:      Chest wall: No tenderness.   Abdominal:      General: Bowel sounds are normal. There is no distension.      Palpations: Abdomen is soft. There is no mass.      Tenderness: There is no abdominal tenderness. There is no guarding or rebound.   Musculoskeletal: Normal range of motion.         General: No tenderness.   Lymphadenopathy:      Cervical: No cervical adenopathy.   Skin:     General: Skin is warm and dry.      Findings: No erythema or rash.   Neurological:      Mental Status: She is alert and oriented to person, place, and time.       Cranial Nerves: No cranial nerve deficit.      Motor: No abnormal muscle tone.      Coordination: Coordination normal.      Deep Tendon Reflexes: Reflexes are normal and symmetric. Reflexes normal.   Psychiatric:         Behavior: Behavior normal.         Thought Content: Thought content normal.         Judgment: Judgment normal.         Medical problems and test results were reviewed with the patient today.         ASSESSMENT and PLAN    1.  CPX.  Anticipatory guidance discussed including the importance of sunscreen use, helmet use and seatbelt use.  Patient is due for screening colonoscopy.  Referral will be made accordingly.    2.  Hypothyroidism.  TSH is 3.4.  Continue current dose of Synthroid.    3.  High cholesterol.  LDL is 136.  Patient has had challenges with statin intolerance.  She is currently on Mevacor and doing relatively well.  She has done best with Livalo but unfortunately this is been denied by insurance.    4.  High triglycerides.  Triglycerides are 158.  Continue dietary focus.    5.  History of breast cancer.  Continue follow-up with oncology.    Elements of this note have been dictated using speech recognition software. As a result, errors of speech recognition may have occurred.

## 2019-08-07 NOTE — Progress Notes (Signed)
SUBJECTIVE:   Jill Kaufman is a 54 y.o. female who is here for CPX patient has a past medical history significant for hypothyroidism, high cholesterol, high triglycerides and breast cancer.  Current medications are listed in the EMR and reviewed today.  Review of systems reveals no complaints of chest pain, shortness of breath, orthopnea or PND.  GI and GU review of systems is unremarkable.    HPI  See above    Past Medical History, Past Surgical History, Family history, Social History, and Medications were all reviewed with the patient today and updated as necessary.       Current Outpatient Medications   Medication Sig Dispense Refill   ??? cholecalciferol, vitamin D3, (VITAMIN D3 PO) Take  by mouth.     ??? lovastatin (MEVACOR) 10 mg tablet Take 1 Tab by mouth daily. 90 Tab 3   ??? levothyroxine (SYNTHROID) 75 mcg tablet Take 1 Tab by mouth Daily (before breakfast). 90 Tab 3   ??? Soolantra 1 % crea      ??? tamoxifen (NOLVADEX) 20 mg tablet Take 1 Tab by mouth daily. Indications: hormone receptor positive breast cancer 90 Tab 3     Allergies   Allergen Reactions   ??? Codeine Vertigo     Patient Active Problem List   Diagnosis Code   ??? Malignant neoplasm of central portion of right breast in female, estrogen receptor positive ([place]) C50.111, Z17.0   ??? S/P lumpectomy of breast Z98.890   ??? Abnormal breast finding N64.59     Past Medical History:   Diagnosis Date   ??? Breast cancer ([place])     T1cN0 right breast cancer   ??? GERD (gastroesophageal reflux disease)     OTC meds as needed    ??? High cholesterol    ??? Nausea & vomiting    ??? Thyroid disease 06/14/2011    Hypothyroidism      Past Surgical History:   Procedure Laterality Date   ??? HX BREAST BIOPSY Right 05/04/2018    RIGHT BREAST NEEDLE LOCALIZED BIOPSY performed by Lorenda Cahill, MD at [place]   ??? HX BREAST LUMPECTOMY Right 05/04/2018    RIGHT BREAST LUMPECTOMY performed by Lorenda Cahill, MD at [place]   ??? HX GYN      hysterectomy partial-still has ovaries      Family History   Problem Relation Age of Onset   ??? Heart Attack Mother    ??? Cancer Father         throat cancer, skin cancer    ??? Diabetes Father    ??? Heart Attack Father      Social History     Tobacco Use   ??? Smoking status: Never Smoker   ??? Smokeless tobacco: Never Used   Substance Use Topics   ??? Alcohol use: Yes     Comment: occassional         Review of Systems  See above    OBJECTIVE:  Visit Vitals  BP 118/70   Ht 5\' 3"  (1.6 m)   Wt 151 lb (68.5 kg)   BMI 26.75 kg/m??        Physical Exam  Constitutional:       Appearance: She is well-developed.   HENT:      Head: Normocephalic and atraumatic.      Right Ear: External ear normal.      Left Ear: External ear normal.      Nose: Nose normal.  Mouth/Throat:      Pharynx: No oropharyngeal exudate.   Eyes:      General: No scleral icterus.        Right eye: No discharge.         Left eye: No discharge.      Pupils: Pupils are equal, round, and reactive to light.   Neck:      Musculoskeletal: Normal range of motion and neck supple.      Thyroid: No thyromegaly.      Vascular: No JVD.      Trachea: No tracheal deviation.   Cardiovascular:      Rate and Rhythm: Normal rate and regular rhythm.      Heart sounds: Normal heart sounds. No murmur. No friction rub. No gallop.    Pulmonary:      Effort: Pulmonary effort is normal. No respiratory distress.      Breath sounds: Normal breath sounds. No wheezing or rales.   Chest:      Chest wall: No tenderness.   Abdominal:      General: Bowel sounds are normal. There is no distension.      Palpations: Abdomen is soft. There is no mass.      Tenderness: There is no abdominal tenderness. There is no guarding or rebound.   Musculoskeletal: Normal range of motion.         General: No tenderness.   Lymphadenopathy:      Cervical: No cervical adenopathy.   Skin:     General: Skin is warm and dry.      Findings: No erythema or rash.   Neurological:      Mental Status: She is alert and oriented to person, place, and time.       Cranial Nerves: No cranial nerve deficit.      Motor: No abnormal muscle tone.      Coordination: Coordination normal.      Deep Tendon Reflexes: Reflexes are normal and symmetric. Reflexes normal.   Psychiatric:         Behavior: Behavior normal.         Thought Content: Thought content normal.         Judgment: Judgment normal.         Medical problems and test results were reviewed with the patient today.         ASSESSMENT and PLAN    1.  CPX.  Anticipatory guidance discussed including the importance of sunscreen use, helmet use and seatbelt use.  Patient is due for screening colonoscopy.  Referral will be made accordingly.    2.  Hypothyroidism.  TSH is 3.4.  Continue current dose of Synthroid.    3.  High cholesterol.  LDL is 136.  Patient has had challenges with statin intolerance.  She is currently on Mevacor and doing relatively well.  She has done best with Livalo but unfortunately this is been denied by insurance.    4.  High triglycerides.  Triglycerides are 158.  Continue dietary focus.    5.  History of breast cancer.  Continue follow-up with oncology.    Elements of this note have been dictated using speech recognition software. As a result, errors of speech recognition may have occurred.

## 2019-09-27 NOTE — Telephone Encounter (Signed)
Note created in error.

## 2019-11-01 ENCOUNTER — Ambulatory Visit: Attending: Family | Primary: Family Medicine

## 2019-11-01 ENCOUNTER — Inpatient Hospital Stay: Admit: 2019-11-01 | Payer: PRIVATE HEALTH INSURANCE | Primary: Family Medicine

## 2019-11-01 ENCOUNTER — Ambulatory Visit
Admit: 2019-11-01 | Discharge: 2019-11-01 | Payer: PRIVATE HEALTH INSURANCE | Attending: Family | Primary: Family Medicine

## 2019-11-01 DIAGNOSIS — C50111 Malignant neoplasm of central portion of right female breast: Secondary | ICD-10-CM

## 2019-11-01 DIAGNOSIS — Z170 Estrogen receptor positive status [ER+]: Secondary | ICD-10-CM

## 2019-11-01 LAB — METABOLIC PANEL, COMPREHENSIVE
A-G Ratio: 1.1 — ABNORMAL LOW (ref 1.2–3.5)
ALT (SGPT): 40 U/L (ref 12–65)
AST (SGOT): 16 U/L (ref 15–37)
Albumin: 4 g/dL (ref 3.5–5.0)
Alk. phosphatase: 65 U/L (ref 50–136)
Anion gap: 5 mmol/L — ABNORMAL LOW (ref 7–16)
BUN: 17 MG/DL (ref 6–23)
Bilirubin, total: 0.4 MG/DL (ref 0.2–1.1)
CO2: 28 mmol/L (ref 21–32)
Calcium: 8.8 MG/DL (ref 8.3–10.4)
Chloride: 107 mmol/L (ref 98–107)
Creatinine: 0.9 MG/DL (ref 0.6–1.0)
GFR est AA: >60 mL/min/{1.73_m2} (ref >60)
GFR est non-AA: >60 mL/min/{1.73_m2} (ref >60)
Globulin: 3.7 g/dL — ABNORMAL HIGH (ref 2.3–3.5)
Glucose: 120 mg/dL — ABNORMAL HIGH (ref 65–100)
Potassium: 3.8 mmol/L (ref 3.5–5.1)
Protein, total: 7.7 g/dL (ref 6.3–8.2)
Sodium: 140 mmol/L (ref 136–145)

## 2019-11-01 LAB — CBC WITH AUTOMATED DIFF
ABS. BASOPHILS: 0.1 10*3/uL (ref 0.0–0.2)
ABS. EOSINOPHILS: 0.1 10*3/uL (ref 0.0–0.8)
ABS. IMM. GRANS.: 0 10*3/uL (ref 0.0–0.5)
ABS. LYMPHOCYTES: 1.5 10*3/uL (ref 0.5–4.6)
ABS. MONOCYTES: 0.3 10*3/uL (ref 0.1–1.3)
ABS. NEUTROPHILS: 4.9 10*3/uL (ref 1.7–8.2)
ABSOLUTE NRBC: 0 10*3/uL (ref 0.0–0.2)
BASOPHILS: 1 % (ref 0.0–2.0)
EOSINOPHILS: 2 % (ref 0.5–7.8)
HCT: 38.1 % (ref 35.8–46.3)
HGB: 13.3 g/dL (ref 11.7–15.4)
IMMATURE GRANULOCYTES: 0 % (ref 0.0–5.0)
LYMPHOCYTES: 22 % (ref 13–44)
MCH: 30.2 PG (ref 26.1–32.9)
MCHC: 34.9 g/dL (ref 31.4–35.0)
MCV: 86.6 FL (ref 79.6–97.8)
MONOCYTES: 4 % (ref 4.0–12.0)
MPV: 10.3 FL (ref 9.4–12.3)
NEUTROPHILS: 72 % (ref 43–78)
PLATELET: 269 10*3/uL (ref 150–450)
RBC: 4.4 M/uL (ref 4.05–5.2)
RDW: 12.2 % (ref 11.9–14.6)
WBC: 6.8 10*3/uL (ref 4.3–11.1)

## 2019-11-01 LAB — COMPREHENSIVE METABOLIC PANEL
ALT: 40 U/L (ref 12–65)
AST: 16 U/L (ref 15–37)
Albumin/Globulin Ratio: 1.1 — ABNORMAL LOW (ref 1.2–3.5)
Albumin: 4 g/dL (ref 3.5–5.0)
Alkaline Phosphatase: 65 U/L (ref 50–136)
Anion Gap: 5 mmol/L — ABNORMAL LOW (ref 7–16)
BUN: 17 MG/DL (ref 6–23)
CO2: 28 mmol/L (ref 21–32)
Calcium: 8.8 MG/DL (ref 8.3–10.4)
Chloride: 107 mmol/L (ref 98–107)
Creatinine: 0.9 MG/DL (ref 0.6–1.0)
EGFR IF NonAfrican American: >60 mL/min/{1.73_m2} (ref >60)
GFR African American: >60 mL/min/{1.73_m2} (ref >60)
Globulin: 3.7 g/dL — ABNORMAL HIGH (ref 2.3–3.5)
Glucose: 120 mg/dL — ABNORMAL HIGH (ref 65–100)
Potassium: 3.8 mmol/L (ref 3.5–5.1)
Sodium: 140 mmol/L (ref 136–145)
Total Bilirubin: 0.4 MG/DL (ref 0.2–1.1)
Total Protein: 7.7 g/dL (ref 6.3–8.2)

## 2019-11-01 LAB — CBC WITH AUTO DIFFERENTIAL
Basophils %: 1 % (ref 0.0–2.0)
Basophils Absolute: 0.1 10*3/uL (ref 0.0–0.2)
Eosinophils %: 2 % (ref 0.5–7.8)
Eosinophils Absolute: 0.1 10*3/uL (ref 0.0–0.8)
Granulocyte Absolute Count: 0 10*3/uL (ref 0.0–0.5)
Hematocrit: 38.1 % (ref 35.8–46.3)
Hemoglobin: 13.3 g/dL (ref 11.7–15.4)
Immature Granulocytes: 0 % (ref 0.0–5.0)
Lymphocytes %: 22 % (ref 13–44)
Lymphocytes Absolute: 1.5 10*3/uL (ref 0.5–4.6)
MCH: 30.2 PG (ref 26.1–32.9)
MCHC: 34.9 g/dL (ref 31.4–35.0)
MCV: 86.6 FL (ref 79.6–97.8)
MPV: 10.3 FL (ref 9.4–12.3)
Monocytes %: 4 % (ref 4.0–12.0)
Monocytes Absolute: 0.3 10*3/uL (ref 0.1–1.3)
NRBC Absolute: 0 10*3/uL (ref 0.0–0.2)
Neutrophils %: 72 % (ref 43–78)
Neutrophils Absolute: 4.9 10*3/uL (ref 1.7–8.2)
Platelets: 269 10*3/uL (ref 150–450)
RBC: 4.4 M/uL (ref 4.05–5.2)
RDW: 12.2 % (ref 11.9–14.6)
WBC: 6.8 10*3/uL (ref 4.3–11.1)

## 2019-11-01 MED ORDER — TAMOXIFEN 20 MG TAB
20 mg | ORAL_TABLET | Freq: Every day | ORAL | 3 refills | Status: DC
Start: 2019-11-01 — End: 2020-10-09
  Filled 2019-11-01: qty 90, 90d supply, fill #0

## 2019-11-01 NOTE — Progress Notes (Signed)
Progress Notes by Charna Elizabeth, NP at 11/01/19 1430                Author: Charna Elizabeth, NP  Service: --  Author Type: Nurse Practitioner       Filed: 11/01/19 0981  Encounter Date: 11/01/2019  Status: Signed          Editor: Charna Elizabeth, NP (Nurse Practitioner)               [facility] Hematology and Oncology: Office Visit Established Patient      Chief Complaint:       Chief Complaint       Patient presents with        ?  Follow-up        History of Present Illness:   Jill Kaufman is a  54 y.o. female who presents today for evaluation regarding breast cancer.  She underwent  routine mammogram in September 2019 which showed an irregular mass density in the upper outer right breast.  She underwent ultrasound and biopsy which showed invasive ductal carcinoma, grade 2, ER 95%, PR 95%, HER2 negative.  MRI showed the lesion to  be 1.5 cm with no other abnormalities.  She was referred to Dr. Domenica Fail and underwent lumpectomy and sentinel node biopsy on 05/04/18 which showed 1.6 cm of tumor with negative margins and 1 sentinel lymph node negative for tumor.  Oncotype Dx testing  was low risk at 6; therefore, chemotherapy can be safely deferred.  We will refer her for radiation therapy, then begin endocrine therapy.  She has completed hysterectomy but her ovaries are intact,  her FSH/LH/E2 levels were borderline so we felt it would be prudent to proceed with tamoxifen for 2-3 years, then transition to AI once she is clearly post-menopausal.        She returns for routine follow-up.  She continues to tolerate Tamoxifen very well.  There are no GI or bowel complaints.  Appetite is good and weight is stable.  Good energy level.  No shortness of breath, chest pain, or edema.  She notes intermittent  leg pain/cramps that she attributes to the combo of Tamoxifen and her statin.  No breast change, however she reports a random pruritic rash to the areas of her right breast radiation markers.  She saw dermatology  last week and was told it was eczema and  was given RX cream, however it is improving and is well controlled with aquaphor alone.  Hot flashes occur most nights.        Review of Systems:   Constitutional: Negative.    HENT: Negative.    Eyes: Negative.    Respiratory: Negative.    Cardiovascular: Negative.    Gastrointestinal: Negative.    Genitourinary: Negative.    Musculoskeletal: Negative.    Skin: Negative.    Neurological: Negative.    Endo/Heme/Allergies: Negative.    Psychiatric/Behavioral: Negative.    All other systems reviewed and are negative.         Allergies        Allergen  Reactions         ?  Codeine  Vertigo          Past Medical History:        Diagnosis  Date         ?  Breast cancer ([place])            T1cN0 right breast cancer         ?  GERD (gastroesophageal reflux disease)            OTC meds as needed          ?  High cholesterol       ?  Nausea & vomiting       ?  Thyroid disease  06/14/2011          Hypothyroidism           Past Surgical History:         Procedure  Laterality  Date          ?  HX BREAST BIOPSY  Right  05/04/2018          RIGHT BREAST NEEDLE LOCALIZED BIOPSY performed by Lorenda Cahill, MD at [place]          ?  HX BREAST LUMPECTOMY  Right  05/04/2018          RIGHT BREAST LUMPECTOMY performed by Lorenda Cahill, MD at [place]          ?  HX GYN              hysterectomy partial-still has ovaries          Family History         Problem  Relation  Age of Onset          ?  Heart Attack  Mother       ?  Cancer  Father                throat cancer, skin cancer           ?  Diabetes  Father            ?  Heart Attack  Father            Social History          Socioeconomic History         ?  Marital status:  MARRIED              Spouse name:  Not on file         ?  Number of children:  Not on file     ?  Years of education:  Not on file     ?  Highest education level:  Not on file       Occupational History        ?  Not on file       Tobacco Use         ?   Smoking status:  Never Smoker     ?  Smokeless tobacco:  Never Used       Substance and Sexual Activity         ?  Alcohol use:  Yes             Comment: occassional         ?  Drug use:  Not Currently     ?  Sexual activity:  Not on file        Other Topics  Concern         ?  Military Service  Not Asked     ?  Blood Transfusions  Not Asked     ?  Caffeine Concern  Not Asked     ?  Occupational Exposure  Not Asked     ?  Hobby Hazards  Not Asked     ?  Sleep Concern  Not Asked     ?  Stress Concern  Not Asked     ?  Weight Concern  Not Asked     ?  Special Diet  Not Asked     ?  Back Care  Not Asked     ?  Exercise  Not Asked     ?  Bike Helmet  Not Asked     ?  Seat Belt  Not Asked     ?  Self-Exams  Not Asked       Social History Narrative        ?  Not on file          Social Determinants of Health          Financial Resource Strain:         ?  Difficulty of Paying Living Expenses:        Food Insecurity:         ?  Worried About Charity fundraiser in the Last Year:      ?  Arboriculturist in the Last Year:        Transportation Needs:         ?  Film/video editor (Medical):      ?  Lack of Transportation (Non-Medical):        Physical Activity:         ?  Days of Exercise per Week:      ?  Minutes of Exercise per Session:        Stress:         ?  Feeling of Stress :        Social Connections:         ?  Frequency of Communication with Friends and Family:      ?  Frequency of Social Gatherings with Friends and Family:      ?  Attends Religious Services:      ?  Active Member of Clubs or Organizations:      ?  Attends Archivist Meetings:      ?  Marital Status:        Intimate Partner Violence:         ?  Fear of Current or Ex-Partner:      ?  Emotionally Abused:      ?  Physically Abused:         ?  Sexually Abused:           Current Outpatient Medications          Medication  Sig  Dispense  Refill           ?  tamoxifen (NOLVADEX) 20 mg tablet  Take 1 Tablet by mouth daily. Indications:  hormone receptor positive breast cancer  90 Tablet  3     ?  cholecalciferol, vitamin D3, (VITAMIN D3 PO)  Take  by mouth.         ?  lovastatin (MEVACOR) 10 mg tablet  Take 1 Tab by mouth daily.  90 Tab  3     ?  levothyroxine (SYNTHROID) 75 mcg tablet  Take 1 Tab by mouth Daily (before breakfast).  90 Tab  3           ?  Soolantra 1 % crea                 OBJECTIVE:   Visit Vitals  BP  131/79 (BP 1 Location: Left upper arm, BP Patient Position: Sitting, BP Cuff Size: Adult)     Pulse  80     Temp  98.6 ??F (37 ??C) (Oral)     Resp  14     Ht  _0  (1.6 m)     Wt  149 lb 9 oz (67.8 kg)     SpO2  97%        BMI  26.49 kg/m??               Constitutional:  Well developed, well nourished female in no acute  distress, sitting comfortably in the exam room chair.         HEENT:  Normocephalic and atraumatic. Sclerae anicteric. Neck supple without JVD. No thyromegaly present.      Lymph node     No palpable submandibular, cervical, supraclavicular, axillary lymph nodes.     Skin  Warm and dry.  No bruising and no rash noted.  No erythema.  No pallor.  +right breast with pink rough areas, nontender      Respiratory  Lungs are clear to auscultation bilaterally without wheezes, rales or rhonchi, normal air exchange without accessory muscle use.      CVS  Normal rate, regular rhythm and normal S1 and S2.  No murmurs, gallops, or rubs.     Abdomen  Soft, nontender and nondistended, normoactive bowel sounds.  No palpable mass.     Neuro  Grossly nonfocal with no obvious sensory or motor deficits.     MSK  Normal range of motion in general.  No edema and no tenderness.     Psych  Appropriate mood and affect.          Labs:     Recent Results (from the past 24 hour(s))     CBC WITH AUTOMATED DIFF          Collection Time: 11/01/19  1:57 PM         Result  Value  Ref Range            WBC  6.8  4.3 - 11.1 K/uL       RBC  4.40  4.05 - 5.2 M/uL       HGB  13.3  11.7 - 15.4 g/dL       HCT  38.1  35.8 - 46.3 %       MCV  86.6  79.6 -  97.8 FL       MCH  30.2  26.1 - 32.9 PG       MCHC  34.9  31.4 - 35.0 g/dL       RDW  12.2  11.9 - 14.6 %       PLATELET  269  150 - 450 K/uL       MPV  10.3  9.4 - 12.3 FL       ABSOLUTE NRBC  0.00  0.0 - 0.2 K/uL       DF  AUTOMATED          NEUTROPHILS  72  43 - 78 %       LYMPHOCYTES  22  13 - 44 %       MONOCYTES  4  4.0 - 12.0 %       EOSINOPHILS  2  0.5 - 7.8 %       BASOPHILS  1  0.0 - 2.0 %  IMMATURE GRANULOCYTES  0  0.0 - 5.0 %       ABS. NEUTROPHILS  4.9  1.7 - 8.2 K/UL       ABS. LYMPHOCYTES  1.5  0.5 - 4.6 K/UL       ABS. MONOCYTES  0.3  0.1 - 1.3 K/UL       ABS. EOSINOPHILS  0.1  0.0 - 0.8 K/UL       ABS. BASOPHILS  0.1  0.0 - 0.2 K/UL       ABS. IMM. GRANS.  0.0  0.0 - 0.5 K/UL       METABOLIC PANEL, COMPREHENSIVE          Collection Time: 11/01/19  1:57 PM         Result  Value  Ref Range            Sodium  140  136 - 145 mmol/L       Potassium  3.8  3.5 - 5.1 mmol/L       Chloride  107  98 - 107 mmol/L       CO2  28  21 - 32 mmol/L       Anion gap  5 (L)  7 - 16 mmol/L       Glucose  120 (H)  65 - 100 mg/dL       BUN  17  6 - 23 MG/DL       Creatinine  0.90  0.6 - 1.0 MG/DL       GFR est AA  >60  >60 ml/min/1.86m       GFR est non-AA  >60  >60 ml/min/1.7104m      Calcium  8.8  8.3 - 10.4 MG/DL       Bilirubin, total  0.4  0.2 - 1.1 MG/DL       ALT (SGPT)  40  12 - 65 U/L       AST (SGOT)  16  15 - 37 U/L       Alk. phosphatase  65  50 - 136 U/L       Protein, total  7.7  6.3 - 8.2 g/dL       Albumin  4.0  3.5 - 5.0 g/dL       Globulin  3.7 (H)  2.3 - 3.5 g/dL            A-G Ratio  1.1 (L)  1.2 - 3.5             Imaging:   MRI of the Breasts with and without contrast   ??   CLINICAL INDICATION:  New diagnosis of right 9:00 breast cancer status post   biopsy on 10/1/2019at [facility] demonstrating invasive ductal   carcinoma grade 2. Evaluate for extent of disease, staging, therapy planning. No   reported personal malignancy or breast surgery otherwise, and no family breast   or  ovarian cancer.   ??   COM[facility]/27/2018   ??   TECHNIQUE: Standard MRI sequences were obtained through the breasts in multiple   planes. Images were obtained before and after intravenous infusion of 13 mL of   Dotarem contrast. Images were reviewed with PACS and with Dynacad CAD software.   ??   FINDINGS: The breasts demonstrate moderate bilateral glandularity, and moderate   bilateral background enhancement which could limit accuracy. There are   innumerable  tiny enhancing foci and a few scattered small cysts on each side.   ??   Right lateral 9:00 biopsy site demonstrates a clip within an irregular   heterogeneously enhancing mass measuring up to 1.3 x 1.0 x 1.5 cm.   ??   There is no evidence of suspicious enhancing mass, and no dominant or unique   nonmass enhancement, to suggest additional malignancy in either breast.  There   is no evidence of axillary or internal mammary lymphadenopathy. Chest wall   signal is normal.    ??   ??   IMPRESSION   IMPRESSION:    1. Right 9:00 breast cancer measures up to 1.5 cm.   2. No additional discrete abnormality in either breast. Moderate background   enhancement.   3. No evidence of lymphadenopathy or chest wall abnormalities.   ??   Recommend continued management as directed clinically. Annual bilateral   mammogram will be due in September 2020. This patient would likely benefit from   supplemental breast MRI in one year as well.   ??   BI-RADS Assessment Category 6: Known Biopsy Proven Malignancy   ??   ??   Pathology:                      ASSESSMENT:             ICD-10-CM  ICD-9-CM             1.  Malignant neoplasm of central portion of right breast in female, estrogen receptor positive (HCC)   C50.111  174.1  tamoxifen (NOLVADEX) 20 mg tablet            Z17.0  V86.0             2.  Malignant neoplasm of right female breast, unspecified estrogen receptor status, unspecified site of  breast (HCC)   C50.911  174.9  MAM MAMMO BI SCREENING INCL CAD                CBC WITH AUTOMATED DIFF                METABOLIC PANEL, COMPREHENSIVE           Problem List   Date Reviewed:  Nov 04, 2019                    Codes  Class  Noted             Abnormal breast finding  ICD-10-CM: N64.59   ICD-9-CM: 796.4    11-04-2018                       S/P lumpectomy of breast  ICD-10-CM: Z98.890   ICD-9-CM: V45.89    05/17/2018                       Malignant neoplasm of central portion of right breast in female, estrogen receptor positive ([place])  ICD-10-CM: C50.111, Z17.0   ICD-9-CM: 174.1, V86.0    04/12/2018                             PLAN:   Lab studies were personally reviewed.      Breast cancer: 1.6 cm, grade 2 IDC, sentinel lymph node biopsy negative, ER 95%, PR 95%, HER2 negative.  S/p lumpectomy with negative margins.  She has completed definitive surgery.  Because of  the ER/PR positivity, the negative nodal assessment, and the T1 tumor, she would be an excellent candidate for Oncotype Dx for risk stratification, which was low risk at 6.  Therefore, chemotherapy can be safely deferred.  We will refer her for radiation  therapy, then begin endocrine therapy.  She has completed hysterectomy but her ovaries are intact, her FSH/LH/E2 levels were borderline so we felt it would be prudent to proceed with tamoxifen for 2-3 years, then transition to AI once she is clearly post-menopausal.   We discussed side effects of tamoxifen including the risk of VTE, as well as vasomotor symptoms and arthralgias.  She started tamoxifen after radiation was completed.        She returns for routine follow-up.  She continues to tolerate Tamoxifen very well.  There are no GI or bowel complaints.  Appetite is good and weight is stable.  Good energy level.   No shortness of breath, chest pain, or edema.  She notes intermittent leg pain/cramps that she attributes to the combo of Tamoxifen and her statin.  No breast change, however she  reports a random pruritic rash to the areas of her right breast radiation  markers.  She saw dermatology last week and was told it was eczema and was given RX cream, however it is improving and is well controlled with aquaphor alone.  Hot flashes occur most nights.  Labs reviewed and unremarkable.  She will continue with Tamoxifen  as planned.  She can try taking PO Mg[facility] at [facility] for cramps. We will order her annual mammogram in October and will plan for MRI for early spring 2022 to alternated imaging.  She is in agreement with this plan.  Recheck endocrine labs in 2022 to monitor for  menopause and consider change to AI.  All questions were asked and answered to the best of my ability.  F/u in 6 months.                   Charna Elizabeth, NP   [facility] Hematology and [place] [address]   Office : [phone]   Fax : [phone]

## 2019-11-01 NOTE — Progress Notes (Signed)
[facility]: Office Visit Established Patient    Chief Complaint:    Chief Complaint   Patient presents with   ??? Follow-up     History of Present Illness:  Jill Kaufman is a 54 y.o. female who presents today for evaluation regarding breast cancer.  She underwent routine mammogram in September 2019 which showed an irregular mass density in the upper outer right breast.  She underwent ultrasound and biopsy which showed invasive ductal carcinoma, grade 2, ER 95%, PR 95%, HER2 negative.  MRI showed the lesion to be 1.5 cm with no other abnormalities.  She was referred to Dr. Domenica Fail and underwent lumpectomy and sentinel node biopsy on 05/04/18 which showed 1.6 cm of tumor with negative margins and 1 sentinel lymph node negative for tumor.  Oncotype Dx testing was low risk at 6; therefore, chemotherapy can be safely deferred.  We will refer her for radiation therapy, then begin endocrine therapy.  She has completed hysterectomy but her ovaries are intact, her FSH/LH/E2 levels were borderline so we felt it would be prudent to proceed with tamoxifen for 2-3 years, then transition to AI once she is clearly post-menopausal.      She returns for routine follow-up.  She continues to tolerate Tamoxifen very well.  There are no GI or bowel complaints.  Appetite is good and weight is stable.  Good energy level.  No shortness of breath, chest pain, or edema.  She notes intermittent leg pain/cramps that she attributes to the combo of Tamoxifen and her statin.  No breast change, however she reports a random pruritic rash to the areas of her right breast radiation markers.  She saw dermatology last week and was told it was eczema and was given RX cream, however it is improving and is well controlled with aquaphor alone.  Hot flashes occur most nights.      Review of Systems:  Constitutional: Negative.   HENT: Negative.   Eyes: Negative.   Respiratory: Negative.   Cardiovascular: Negative.   Gastrointestinal:  Negative.   Genitourinary: Negative.   Musculoskeletal: Negative.   Skin: Negative.   Neurological: Negative.   Endo/Heme/Allergies: Negative.   Psychiatric/Behavioral: Negative.   All other systems reviewed and are negative.     Allergies   Allergen Reactions   ??? Codeine Vertigo     Past Medical History:   Diagnosis Date   ??? Breast cancer ([place])     T1cN0 right breast cancer   ??? GERD (gastroesophageal reflux disease)     OTC meds as needed    ??? High cholesterol    ??? Nausea & vomiting    ??? Thyroid disease 06/14/2011    Hypothyroidism      Past Surgical History:   Procedure Laterality Date   ??? HX BREAST BIOPSY Right 05/04/2018    RIGHT BREAST NEEDLE LOCALIZED BIOPSY performed by Lorenda Cahill, MD at [facility]   ??? HX BREAST LUMPECTOMY Right 05/04/2018    RIGHT BREAST LUMPECTOMY performed by Lorenda Cahill, MD at [facility]   ??? HX GYN      hysterectomy partial-still has ovaries     Family History   Problem Relation Age of Onset   ??? Heart Attack Mother    ??? Cancer Father         throat cancer, skin cancer    ??? Diabetes Father    ??? Heart Attack Father      Social History  Socioeconomic History   ??? Marital status: MARRIED     Spouse name: Not on file   ??? Number of children: Not on file   ??? Years of education: Not on file   ??? Highest education level: Not on file   Occupational History   ??? Not on file   Tobacco Use   ??? Smoking status: Never Smoker   ??? Smokeless tobacco: Never Used   Substance and Sexual Activity   ??? Alcohol use: Yes     Comment: occassional   ??? Drug use: Not Currently   ??? Sexual activity: Not on file   Other Topics Concern   ??? Military Service Not Asked   ??? Blood Transfusions Not Asked   ??? Caffeine Concern Not Asked   ??? Occupational Exposure Not Asked   ??? Hobby Hazards Not Asked   ??? Sleep Concern Not Asked   ??? Stress Concern Not Asked   ??? Weight Concern Not Asked   ??? Special Diet Not Asked   ??? Back Care Not Asked   ??? Exercise Not Asked   ??? Bike Helmet Not Asked   ??? Seat Belt Not Asked   ???  Self-Exams Not Asked   Social History Narrative   ??? Not on file     Social Determinants of Health     Financial Resource Strain:    ??? Difficulty of Paying Living Expenses:    Food Insecurity:    ??? Worried About Charity fundraiser in the Last Year:    ??? Arboriculturist in the Last Year:    Transportation Needs:    ??? Film/video editor (Medical):    ??? Lack of Transportation (Non-Medical):    Physical Activity:    ??? Days of Exercise per Week:    ??? Minutes of Exercise per Session:    Stress:    ??? Feeling of Stress :    Social Connections:    ??? Frequency of Communication with Friends and Family:    ??? Frequency of Social Gatherings with Friends and Family:    ??? Attends Religious Services:    ??? Marine scientist or Organizations:    ??? Attends Music therapist:    ??? Marital Status:    Intimate Production manager Violence:    ??? Fear of Current or Ex-Partner:    ??? Emotionally Abused:    ??? Physically Abused:    ??? Sexually Abused:      Current Outpatient Medications   Medication Sig Dispense Refill   ??? tamoxifen (NOLVADEX) 20 mg tablet Take 1 Tablet by mouth daily. Indications: hormone receptor positive breast cancer 90 Tablet 3   ??? cholecalciferol, vitamin D3, (VITAMIN D3 PO) Take  by mouth.     ??? lovastatin (MEVACOR) 10 mg tablet Take 1 Tab by mouth daily. 90 Tab 3   ??? levothyroxine (SYNTHROID) 75 mcg tablet Take 1 Tab by mouth Daily (before breakfast). 90 Tab 3   ??? Soolantra 1 % crea          OBJECTIVE:  Visit Vitals  BP 131/79 (BP 1 Location: Left upper arm, BP Patient Position: Sitting, BP Cuff Size: Adult)   Pulse 80   Temp 98.6 ??F (37 ??C) (Oral)   Resp 14   Ht 5' 3" (1.6 m)   Wt 149 lb 9 oz (67.8 kg)   SpO2 97%   BMI 26.49 kg/m??        Constitutional: Well developed, well nourished  female in no acute distress, sitting comfortably in the exam room chair.    HEENT: Normocephalic and atraumatic. Sclerae anicteric. Neck supple without JVD. No thyromegaly present.    Lymph node   No palpable submandibular,  cervical, supraclavicular, axillary lymph nodes.   Skin Warm and dry.  No bruising and no rash noted.  No erythema.  No pallor.  +right breast with pink rough areas, nontender    Respiratory Lungs are clear to auscultation bilaterally without wheezes, rales or rhonchi, normal air exchange without accessory muscle use.    CVS Normal rate, regular rhythm and normal S1 and S2.  No murmurs, gallops, or rubs.   Abdomen Soft, nontender and nondistended, normoactive bowel sounds.  No palpable mass.   Neuro Grossly nonfocal with no obvious sensory or motor deficits.   MSK Normal range of motion in general.  No edema and no tenderness.   Psych Appropriate mood and affect.       Labs:  Recent Results (from the past 24 hour(s))   CBC WITH AUTOMATED DIFF    Collection Time: 11/01/19  1:57 PM   Result Value Ref Range    WBC 6.8 4.3 - 11.1 K/uL    RBC 4.40 4.05 - 5.2 M/uL    HGB 13.3 11.7 - 15.4 g/dL    HCT 38.1 35.8 - 46.3 %    MCV 86.6 79.6 - 97.8 FL    MCH 30.2 26.1 - 32.9 PG    MCHC 34.9 31.4 - 35.0 g/dL    RDW 12.2 11.9 - 14.6 %    PLATELET 269 150 - 450 K/uL    MPV 10.3 9.4 - 12.3 FL    ABSOLUTE NRBC 0.00 0.0 - 0.2 K/uL    DF AUTOMATED      NEUTROPHILS 72 43 - 78 %    LYMPHOCYTES 22 13 - 44 %    MONOCYTES 4 4.0 - 12.0 %    EOSINOPHILS 2 0.5 - 7.8 %    BASOPHILS 1 0.0 - 2.0 %    IMMATURE GRANULOCYTES 0 0.0 - 5.0 %    ABS. NEUTROPHILS 4.9 1.7 - 8.2 K/UL    ABS. LYMPHOCYTES 1.5 0.5 - 4.6 K/UL    ABS. MONOCYTES 0.3 0.1 - 1.3 K/UL    ABS. EOSINOPHILS 0.1 0.0 - 0.8 K/UL    ABS. BASOPHILS 0.1 0.0 - 0.2 K/UL    ABS. IMM. GRANS. 0.0 0.0 - 0.5 K/UL   METABOLIC PANEL, COMPREHENSIVE    Collection Time: 11/01/19  1:57 PM   Result Value Ref Range    Sodium 140 136 - 145 mmol/L    Potassium 3.8 3.5 - 5.1 mmol/L    Chloride 107 98 - 107 mmol/L    CO2 28 21 - 32 mmol/L    Anion gap 5 (L) 7 - 16 mmol/L    Glucose 120 (H) 65 - 100 mg/dL    BUN 17 6 - 23 MG/DL    Creatinine 0.90 0.6 - 1.0 MG/DL    GFR est AA >60 >60 ml/min/1.65m    GFR est  non-AA >60 >60 ml/min/1.710m   Calcium 8.8 8.3 - 10.4 MG/DL    Bilirubin, total 0.4 0.2 - 1.1 MG/DL    ALT (SGPT) 40 12 - 65 U/L    AST (SGOT) 16 15 - 37 U/L    Alk. phosphatase 65 50 - 136 U/L    Protein, total 7.7 6.3 - 8.2 g/dL    Albumin 4.0 3.5 - 5.0  g/dL    Globulin 3.7 (H) 2.3 - 3.5 g/dL    A-G Ratio 1.1 (L) 1.2 - 3.5         Imaging:  MRI of the Breasts with and without contrast  ??  CLINICAL INDICATION:  New diagnosis of right 9:00 breast cancer status post  biopsy on 10/1/2019at [facility] demonstrating invasive ductal  carcinoma grade 2. Evaluate for extent of disease, staging, therapy planning. No  reported personal malignancy or breast surgery otherwise, and no family breast  or ovarian cancer.  ??  COMPARISON: Outside mammography and ultrasound from [facility]  03/13/2018, 03/07/2018, 02/27/2018  ??  TECHNIQUE: Standard MRI sequences were obtained through the breasts in multiple  planes. Images were obtained before and after intravenous infusion of 13 mL of  Dotarem contrast. Images were reviewed with PACS and with Dynacad CAD software.  ??  FINDINGS: The breasts demonstrate moderate bilateral glandularity, and moderate  bilateral background enhancement which could limit accuracy. There are  innumerable tiny enhancing foci and a few scattered small cysts on each side.  ??  Right lateral 9:00 biopsy site demonstrates a clip within an irregular  heterogeneously enhancing mass measuring up to 1.3 x 1.0 x 1.5 cm.  ??  There is no evidence of suspicious enhancing mass, and no dominant or unique  nonmass enhancement, to suggest additional malignancy in either breast.  There  is no evidence of axillary or internal mammary lymphadenopathy. Chest wall  signal is normal.   ??  ??  IMPRESSION  IMPRESSION:   1. Right 9:00 breast cancer measures up to 1.5 cm.  2. No additional discrete abnormality in either breast. Moderate background  enhancement.  3. No evidence of lymphadenopathy or chest  wall abnormalities.  ??  Recommend continued management as directed clinically. Annual bilateral  mammogram will be due in September 2020. This patient would likely benefit from  supplemental breast MRI in one year as well.  ??  BI-RADS Assessment Category 6: Known Biopsy Proven Malignancy  ??  ??  Pathology:            ASSESSMENT:    ICD-10-CM ICD-9-CM    1. Malignant neoplasm of central portion of right breast in female, estrogen receptor positive (HCC)  C50.111 174.1 tamoxifen (NOLVADEX) 20 mg tablet    Z17.0 V86.0    2. Malignant neoplasm of right female breast, unspecified estrogen receptor status, unspecified site of breast ([place])  C50.911 174.9 MAM MAMMO BI SCREENING INCL CAD      CBC WITH AUTOMATED DIFF      METABOLIC PANEL, COMPREHENSIVE     Problem List  Date Reviewed: 11-29-19        Codes Class Noted    Abnormal breast finding ICD-10-CM: N64.59  ICD-9-CM: 796.4  2018/11/29        S/P lumpectomy of breast ICD-10-CM: Z98.890  ICD-9-CM: V45.89  05/17/2018        Malignant neoplasm of central portion of right breast in female, estrogen receptor positive ([place]) ICD-10-CM: C50.111, Z17.0  ICD-9-CM: 174.1, V86.0  04/12/2018                PLAN:  Lab studies were personally reviewed.    Breast cancer: 1.6 cm, grade 2 IDC, sentinel lymph node biopsy negative, ER 95%, PR 95%, HER2 negative.  S/p lumpectomy with negative margins.  She has completed definitive surgery.  Because of the ER/PR positivity, the negative nodal assessment, and the T1 tumor, she would be an  excellent candidate for Oncotype Dx for risk stratification, which was low risk at 6.  Therefore, chemotherapy can be safely deferred.  We will refer her for radiation therapy, then begin endocrine therapy.  She has completed hysterectomy but her ovaries are intact, her FSH/LH/E2 levels were borderline so we felt it would be prudent to proceed with tamoxifen for 2-3 years, then transition to AI once she is clearly post-menopausal.  We discussed side effects of  tamoxifen including the risk of VTE, as well as vasomotor symptoms and arthralgias.  She started tamoxifen after radiation was completed.      She returns for routine follow-up.  She continues to tolerate Tamoxifen very well.  There are no GI or bowel complaints.  Appetite is good and weight is stable.  Good energy level.  No shortness of breath, chest pain, or edema.  She notes intermittent leg pain/cramps that she attributes to the combo of Tamoxifen and her statin.  No breast change, however she reports a random pruritic rash to the areas of her right breast radiation markers.  She saw dermatology last week and was told it was eczema and was given RX cream, however it is improving and is well controlled with aquaphor alone.  Hot flashes occur most nights.  Labs reviewed and unremarkable.  She will continue with Tamoxifen as planned.  She can try taking PO Mg+ at [facility] for cramps. We will order her annual mammogram in October and will plan for MRI for early spring 2022 to alternated imaging.  She is in agreement with this plan.  Recheck endocrine labs in 2022 to monitor for menopause and consider change to AI.  All questions were asked and answered to the best of my ability.  F/u in 6 months.            Jill Elizabeth, NP  [facility] [place] [address]  Office : [phone]  Fax : [phone]

## 2019-11-01 NOTE — Patient Instructions (Addendum)
Patient Instructions from Today's Visit    Reason for Visit:  Follow up breast       Plan:  You look great   We discussed ordering a mammogram for October and then a mri in 6 months .  Labs  are good   Continue your tamoxifen    Follow Up:  6 months  Mammogram in October     Recent Lab Results:  Recent Results (from the past 12 hour(s))   CBC WITH AUTOMATED DIFF    Collection Time: 11/01/19  1:57 PM   Result Value Ref Range    WBC 6.8 4.3 - 11.1 K/uL    RBC 4.40 4.05 - 5.2 M/uL    HGB 13.3 11.7 - 15.4 g/dL    HCT 38.1 35.8 - 46.3 %    MCV 86.6 79.6 - 97.8 FL    MCH 30.2 26.1 - 32.9 PG    MCHC 34.9 31.4 - 35.0 g/dL    RDW 12.2 11.9 - 14.6 %    PLATELET 269 150 - 450 K/uL    MPV 10.3 9.4 - 12.3 FL    ABSOLUTE NRBC 0.00 0.0 - 0.2 K/uL    DF AUTOMATED      NEUTROPHILS 72 43 - 78 %    LYMPHOCYTES 22 13 - 44 %    MONOCYTES 4 4.0 - 12.0 %    EOSINOPHILS 2 0.5 - 7.8 %    BASOPHILS 1 0.0 - 2.0 %    IMMATURE GRANULOCYTES 0 0.0 - 5.0 %    ABS. NEUTROPHILS 4.9 1.7 - 8.2 K/UL    ABS. LYMPHOCYTES 1.5 0.5 - 4.6 K/UL    ABS. MONOCYTES 0.3 0.1 - 1.3 K/UL    ABS. EOSINOPHILS 0.1 0.0 - 0.8 K/UL    ABS. BASOPHILS 0.1 0.0 - 0.2 K/UL    ABS. IMM. GRANS. 0.0 0.0 - 0.5 K/UL   METABOLIC PANEL, COMPREHENSIVE    Collection Time: 11/01/19  1:57 PM   Result Value Ref Range    Sodium 140 136 - 145 mmol/L    Potassium 3.8 3.5 - 5.1 mmol/L    Chloride 107 98 - 107 mmol/L    CO2 28 21 - 32 mmol/L    Anion gap 5 (L) 7 - 16 mmol/L    Glucose 120 (H) 65 - 100 mg/dL    BUN 17 6 - 23 MG/DL    Creatinine 0.90 0.6 - 1.0 MG/DL    GFR est AA >60 >60 ml/min/1.38m    GFR est non-AA >60 >60 ml/min/1.789m   Calcium 8.8 8.3 - 10.4 MG/DL    Bilirubin, total 0.4 0.2 - 1.1 MG/DL    ALT (SGPT) 40 12 - 65 U/L    AST (SGOT) 16 15 - 37 U/L    Alk. phosphatase 65 50 - 136 U/L    Protein, total 7.7 6.3 - 8.2 g/dL    Albumin 4.0 3.5 - 5.0 g/dL    Globulin 3.7 (H) 2.3 - 3.5 g/dL    A-G Ratio 1.1 (L) 1.2 - 3.5                  -------------------------------------------------------------------------------------------------------------------  Please call our office at [phone] you have any  of the following symptoms:   ?? Fever of 100.5 or greater  ?? Chills  ?? Shortness of breath  ?? Swelling or pain in one leg    After office hours an answering service is available and will contact a provider for  emergencies or if you are experiencing any of the above symptoms.    ??? Patient has express an interest in My Chart.  My Chart log in information explained on the after visit summary printout at the [place] desk.  Jill Kaufman

## 2019-11-07 ENCOUNTER — Ambulatory Visit: Primary: Family Medicine

## 2019-11-07 ENCOUNTER — Ambulatory Visit: Admit: 2019-11-07 | Payer: BLUE CROSS/BLUE SHIELD | Primary: Family Medicine

## 2019-11-07 DIAGNOSIS — J0100 Acute maxillary sinusitis, unspecified: Secondary | ICD-10-CM

## 2019-11-07 MED ORDER — AMOXICILLIN CLAVULANATE 875 MG-125 MG TAB
875-125 mg | ORAL_TABLET | Freq: Two times a day (BID) | ORAL | 0 refills | Status: AC
Start: 2019-11-07 — End: 2019-11-14

## 2019-11-07 NOTE — Progress Notes (Signed)
HISTORY OF PRESENT ILLNESS  Jill Kaufman is a 54 y.o. female.  Presenting today with sinus complaints.     10 days ago started with a new sore throat slowly progressing to nasal congestion and a cough. For the past few days, symptoms have rapidly worsened with sinus pressure, teeth pain and sensitivity and low grade fever of 100.  She has been taking OTC decongestants, not helping.     Note she has received her full COVID19 vaccination.                Review of Systems   Constitutional: Positive for malaise/fatigue. Negative for chills, diaphoresis and fever.   HENT: Positive for congestion, sinus pain and sore throat. Negative for ear discharge, ear pain, hearing loss, nosebleeds and tinnitus.    Eyes: Negative.    Respiratory: Negative.  Negative for cough, shortness of breath and stridor.    Cardiovascular: Negative.  Negative for chest pain.   Gastrointestinal: Negative for abdominal pain, diarrhea, nausea and vomiting.   Musculoskeletal: Negative.  Negative for myalgias.   Skin: Negative.  Negative for itching and rash.   Neurological: Positive for headaches.       Past Medical History:   Diagnosis Date   ??? Breast cancer ([place])     T1cN0 right breast cancer   ??? GERD (gastroesophageal reflux disease)     OTC meds as needed    ??? High cholesterol    ??? Nausea & vomiting    ??? Thyroid disease 06/14/2011    Hypothyroidism      Social History     Socioeconomic History   ??? Marital status: MARRIED     Spouse name: Not on file   ??? Number of children: Not on file   ??? Years of education: Not on file   ??? Highest education level: Not on file   Occupational History   ??? Not on file   Tobacco Use   ??? Smoking status: Never Smoker   ??? Smokeless tobacco: Never Used   Substance and Sexual Activity   ??? Alcohol use: Yes     Comment: occassional   ??? Drug use: Not Currently   ??? Sexual activity: Not on file   Other Topics Concern   ??? Military Service Not Asked   ??? Blood Transfusions Not Asked   ??? Caffeine Concern Not Asked   ??? Occupational  Exposure Not Asked   ??? Hobby Hazards Not Asked   ??? Sleep Concern Not Asked   ??? Stress Concern Not Asked   ??? Weight Concern Not Asked   ??? Special Diet Not Asked   ??? Back Care Not Asked   ??? Exercise Not Asked   ??? Bike Helmet Not Asked   ??? Seat Belt Not Asked   ??? Self-Exams Not Asked   Social History Narrative   ??? Not on file     Social Determinants of Health     Financial Resource Strain:    ??? Difficulty of Paying Living Expenses:    Food Insecurity:    ??? Worried About Charity fundraiser in the Last Year:    ??? Arboriculturist in the Last Year:    Transportation Needs:    ??? Film/video editor (Medical):    ??? Lack of Transportation (Non-Medical):    Physical Activity:    ??? Days of Exercise per Week:    ??? Minutes of Exercise per Session:    Stress:    ???  Feeling of Stress :    Social Connections:    ??? Frequency of Communication with Friends and Family:    ??? Frequency of Social Gatherings with Friends and Family:    ??? Attends Religious Services:    ??? Marine scientist or Organizations:    ??? Attends Music therapist:    ??? Marital Status:    Intimate Production manager Violence:    ??? Fear of Current or Ex-Partner:    ??? Emotionally Abused:    ??? Physically Abused:    ??? Sexually Abused:      Family History   Problem Relation Age of Onset   ??? Heart Attack Mother    ??? Cancer Father         throat cancer, skin cancer    ??? Diabetes Father    ??? Heart Attack Father      Past Surgical History:   Procedure Laterality Date   ??? HX BREAST BIOPSY Right 05/04/2018    RIGHT BREAST NEEDLE LOCALIZED BIOPSY performed by Lorenda Cahill, MD at [place]   ??? HX BREAST LUMPECTOMY Right 05/04/2018    RIGHT BREAST LUMPECTOMY performed by Lorenda Cahill, MD at [place]   ??? HX GYN      hysterectomy partial-still has ovaries       Immunization History   Administered Date(s) Administered   ??? COVID-19, PFIZER, MRNA, LNP-S, PF, 30MCG/0.3ML DOSE 06/24/2019, 06/24/2019, 07/15/2019, 07/15/2019   ??? Influenza Vaccine 04/09/2019   ??? Tdap  09/27/2013     Current Outpatient Medications   Medication Sig Dispense Refill   ??? amoxicillin-clavulanate (AUGMENTIN) 875-125 mg per tablet Take 1 Tablet by mouth every twelve (12) hours for 7 days. 14 Tablet 0   ??? tamoxifen (NOLVADEX) 20 mg tablet Take 1 Tablet by mouth daily. Indications: hormone receptor positive breast cancer 90 Tablet 3   ??? cholecalciferol, vitamin D3, (VITAMIN D3 PO) Take  by mouth.     ??? lovastatin (MEVACOR) 10 mg tablet Take 1 Tab by mouth daily. 90 Tab 3   ??? levothyroxine (SYNTHROID) 75 mcg tablet Take 1 Tab by mouth Daily (before breakfast). 90 Tab 3   ??? Soolantra 1 % crea        Allergies   Allergen Reactions   ??? Codeine Vertigo       Physical Exam  Vitals reviewed.   Constitutional:       General: She is awake. She is not in acute distress.     Appearance: Normal appearance. She is well-developed and well-groomed. She is ill-appearing. She is not toxic-appearing.   HENT:      Head: Normocephalic and atraumatic.      Jaw: There is normal jaw occlusion.      Right Ear: Tympanic membrane, ear canal and external ear normal.      Left Ear: Tympanic membrane, ear canal and external ear normal.      Nose: Mucosal edema and congestion present.      Right Turbinates: Enlarged and swollen. Not pale.      Left Turbinates: Enlarged and swollen. Not pale.      Right Sinus: Maxillary sinus tenderness present. No frontal sinus tenderness.      Left Sinus: Maxillary sinus tenderness present. No frontal sinus tenderness.      Mouth/Throat:      Lips: Pink.      Mouth: Mucous membranes are moist.      Pharynx: Oropharynx is clear. Uvula midline.  Posterior oropharyngeal erythema present.      Tonsils: No tonsillar exudate or tonsillar abscesses. 1+ on the right. 1+ on the left.   Eyes:      General: Lids are normal.      Extraocular Movements: Extraocular movements intact.      Conjunctiva/sclera: Conjunctivae normal.      Pupils: Pupils are equal, round, and reactive to light.   Neck:      Trachea:  Trachea and phonation normal.   Cardiovascular:      Rate and Rhythm: Normal rate and regular rhythm.      Pulses: Normal pulses.      Heart sounds: Normal heart sounds, S1 normal and S2 normal.   Pulmonary:      Effort: Pulmonary effort is normal.      Breath sounds: Normal breath sounds and air entry.   Musculoskeletal:         General: Normal range of motion.      Cervical back: Full passive range of motion without pain, normal range of motion and neck supple.   Lymphadenopathy:      Head:      Right side of head: No submental, submandibular, tonsillar, preauricular, posterior auricular or occipital adenopathy.      Left side of head: No submental, submandibular, tonsillar, preauricular, posterior auricular or occipital adenopathy.      Cervical: No cervical adenopathy.   Skin:     General: Skin is warm and dry.      Capillary Refill: Capillary refill takes less than 2 seconds.   Neurological:      General: No focal deficit present.      Mental Status: She is alert and oriented to person, place, and time.   Psychiatric:         Attention and Perception: Attention normal.         Mood and Affect: Mood normal.         Speech: Speech normal.         Behavior: Behavior normal. Behavior is cooperative.         Thought Content: Thought content normal.         Visit Vitals  BP 138/80 (BP 1 Location: Right arm, BP Patient Position: Sitting, BP Cuff Size: Adult)   Pulse 78   Temp 99.2 ??F (37.3 ??C) (Oral)   Resp 16   SpO2 100%     ASSESSMENT and PLAN  Diagnoses and all orders for this visit:    1. Acute non-recurrent maxillary sinusitis  -     amoxicillin-clavulanate (AUGMENTIN) 875-125 mg per tablet; Take 1 Tablet by mouth every twelve (12) hours for 7 days.      Start Augmentin as ordered above.    Patient Education:  Increase fluid intake. Hot steam showers for nasal congestion. Saline irrigation for nasal rinses. Sleep with HOB elevated. Avoid smoking, caffeine, and alcohol. May use ibuprofen, acetaminophen, or ASA for  sinus/head pain or discomfort.     For sinus congestion: May use Afrin, limited to only 3 day use. May use Sudafed only if not on antihypertensive. Recommended Corcidin for patient with HTN.     Follow up with PCP or here in 3-5 days if symptoms not any better or worse.   Seek ED care of developing severe headache, high fever, chest pain, or SOB.     Patient voices understanding and agrees with the plan of care described above.

## 2019-11-07 NOTE — Progress Notes (Signed)
HISTORY OF PRESENT ILLNESS  Jill Kaufman is a 54 y.o. female.  Presenting today with sinus complaints.     10 days ago started with a new sore throat slowly progressing to nasal congestion and a cough. For the past few days, symptoms have rapidly worsened with sinus pressure, teeth pain and sensitivity and low grade fever of 100.  She has been taking OTC decongestants, not helping.     Note she has received her full COVID19 vaccination.                Review of Systems   Constitutional: Positive for malaise/fatigue. Negative for chills, diaphoresis and fever.   HENT: Positive for congestion, sinus pain and sore throat. Negative for ear discharge, ear pain, hearing loss, nosebleeds and tinnitus.    Eyes: Negative.    Respiratory: Negative.  Negative for cough, shortness of breath and stridor.    Cardiovascular: Negative.  Negative for chest pain.   Gastrointestinal: Negative for abdominal pain, diarrhea, nausea and vomiting.   Musculoskeletal: Negative.  Negative for myalgias.   Skin: Negative.  Negative for itching and rash.   Neurological: Positive for headaches.       Past Medical History:   Diagnosis Date   ??? Breast cancer ([place])     T1cN0 right breast cancer   ??? GERD (gastroesophageal reflux disease)     OTC meds as needed    ??? High cholesterol    ??? Nausea & vomiting    ??? Thyroid disease 06/14/2011    Hypothyroidism      Social History     Socioeconomic History   ??? Marital status: MARRIED     Spouse name: Not on file   ??? Number of children: Not on file   ??? Years of education: Not on file   ??? Highest education level: Not on file   Occupational History   ??? Not on file   Tobacco Use   ??? Smoking status: Never Smoker   ??? Smokeless tobacco: Never Used   Substance and Sexual Activity   ??? Alcohol use: Yes     Comment: occassional   ??? Drug use: Not Currently   ??? Sexual activity: Not on file   Other Topics Concern   ??? Military Service Not Asked   ??? Blood Transfusions Not Asked   ??? Caffeine Concern Not Asked   ??? Occupational  Exposure Not Asked   ??? Hobby Hazards Not Asked   ??? Sleep Concern Not Asked   ??? Stress Concern Not Asked   ??? Weight Concern Not Asked   ??? Special Diet Not Asked   ??? Back Care Not Asked   ??? Exercise Not Asked   ??? Bike Helmet Not Asked   ??? Seat Belt Not Asked   ??? Self-Exams Not Asked   Social History Narrative   ??? Not on file     Social Determinants of Health     Financial Resource Strain:    ??? Difficulty of Paying Living Expenses:    Food Insecurity:    ??? Worried About Charity fundraiser in the Last Year:    ??? Arboriculturist in the Last Year:    Transportation Needs:    ??? Film/video editor (Medical):    ??? Lack of Transportation (Non-Medical):    Physical Activity:    ??? Days of Exercise per Week:    ??? Minutes of Exercise per Session:    Stress:    ???  Feeling of Stress :    Social Connections:    ??? Frequency of Communication with Friends and Family:    ??? Frequency of Social Gatherings with Friends and Family:    ??? Attends Religious Services:    ??? Marine scientist or Organizations:    ??? Attends Music therapist:    ??? Marital Status:    Intimate Production manager Violence:    ??? Fear of Current or Ex-Partner:    ??? Emotionally Abused:    ??? Physically Abused:    ??? Sexually Abused:      Family History   Problem Relation Age of Onset   ??? Heart Attack Mother    ??? Cancer Father         throat cancer, skin cancer    ??? Diabetes Father    ??? Heart Attack Father      Past Surgical History:   Procedure Laterality Date   ??? HX BREAST BIOPSY Right 05/04/2018    RIGHT BREAST NEEDLE LOCALIZED BIOPSY performed by Lorenda Cahill, MD at [place]   ??? HX BREAST LUMPECTOMY Right 05/04/2018    RIGHT BREAST LUMPECTOMY performed by Lorenda Cahill, MD at [place]   ??? HX GYN      hysterectomy partial-still has ovaries       Immunization History   Administered Date(s) Administered   ??? COVID-19, PFIZER, MRNA, LNP-S, PF, 30MCG/0.3ML DOSE 06/24/2019, 06/24/2019, 07/15/2019, 07/15/2019   ??? Influenza Vaccine 04/09/2019   ??? Tdap  09/27/2013     Current Outpatient Medications   Medication Sig Dispense Refill   ??? amoxicillin-clavulanate (AUGMENTIN) 875-125 mg per tablet Take 1 Tablet by mouth every twelve (12) hours for 7 days. 14 Tablet 0   ??? tamoxifen (NOLVADEX) 20 mg tablet Take 1 Tablet by mouth daily. Indications: hormone receptor positive breast cancer 90 Tablet 3   ??? cholecalciferol, vitamin D3, (VITAMIN D3 PO) Take  by mouth.     ??? lovastatin (MEVACOR) 10 mg tablet Take 1 Tab by mouth daily. 90 Tab 3   ??? levothyroxine (SYNTHROID) 75 mcg tablet Take 1 Tab by mouth Daily (before breakfast). 90 Tab 3   ??? Soolantra 1 % crea        Allergies   Allergen Reactions   ??? Codeine Vertigo       Physical Exam  Vitals reviewed.   Constitutional:       General: She is awake. She is not in acute distress.     Appearance: Normal appearance. She is well-developed and well-groomed. She is ill-appearing. She is not toxic-appearing.   HENT:      Head: Normocephalic and atraumatic.      Jaw: There is normal jaw occlusion.      Right Ear: Tympanic membrane, ear canal and external ear normal.      Left Ear: Tympanic membrane, ear canal and external ear normal.      Nose: Mucosal edema and congestion present.      Right Turbinates: Enlarged and swollen. Not pale.      Left Turbinates: Enlarged and swollen. Not pale.      Right Sinus: Maxillary sinus tenderness present. No frontal sinus tenderness.      Left Sinus: Maxillary sinus tenderness present. No frontal sinus tenderness.      Mouth/Throat:      Lips: Pink.      Mouth: Mucous membranes are moist.      Pharynx: Oropharynx is clear. Uvula midline.  Posterior oropharyngeal erythema present.      Tonsils: No tonsillar exudate or tonsillar abscesses. 1+ on the right. 1+ on the left.   Eyes:      General: Lids are normal.      Extraocular Movements: Extraocular movements intact.      Conjunctiva/sclera: Conjunctivae normal.      Pupils: Pupils are equal, round, and reactive to light.   Neck:      Trachea:  Trachea and phonation normal.   Cardiovascular:      Rate and Rhythm: Normal rate and regular rhythm.      Pulses: Normal pulses.      Heart sounds: Normal heart sounds, S1 normal and S2 normal.   Pulmonary:      Effort: Pulmonary effort is normal.      Breath sounds: Normal breath sounds and air entry.   Musculoskeletal:         General: Normal range of motion.      Cervical back: Full passive range of motion without pain, normal range of motion and neck supple.   Lymphadenopathy:      Head:      Right side of head: No submental, submandibular, tonsillar, preauricular, posterior auricular or occipital adenopathy.      Left side of head: No submental, submandibular, tonsillar, preauricular, posterior auricular or occipital adenopathy.      Cervical: No cervical adenopathy.   Skin:     General: Skin is warm and dry.      Capillary Refill: Capillary refill takes less than 2 seconds.   Neurological:      General: No focal deficit present.      Mental Status: She is alert and oriented to person, place, and time.   Psychiatric:         Attention and Perception: Attention normal.         Mood and Affect: Mood normal.         Speech: Speech normal.         Behavior: Behavior normal. Behavior is cooperative.         Thought Content: Thought content normal.         Visit Vitals  BP 138/80 (BP 1 Location: Right arm, BP Patient Position: Sitting, BP Cuff Size: Adult)   Pulse 78   Temp 99.2 ??F (37.3 ??C) (Oral)   Resp 16   SpO2 100%     ASSESSMENT and PLAN  Diagnoses and all orders for this visit:    1. Acute non-recurrent maxillary sinusitis  -     amoxicillin-clavulanate (AUGMENTIN) 875-125 mg per tablet; Take 1 Tablet by mouth every twelve (12) hours for 7 days.      Start Augmentin as ordered above.    Patient Education:  Increase fluid intake. Hot steam showers for nasal congestion. Saline irrigation for nasal rinses. Sleep with HOB elevated. Avoid smoking, caffeine, and alcohol. May use ibuprofen, acetaminophen, or ASA for  sinus/head pain or discomfort.     For sinus congestion: May use Afrin, limited to only 3 day use. May use Sudafed only if not on antihypertensive. Recommended Corcidin for patient with HTN.     Follow up with PCP or here in 3-5 days if symptoms not any better or worse.   Seek ED care of developing severe headache, high fever, chest pain, or SOB.     Patient voices understanding and agrees with the plan of care described above.

## 2020-01-25 ENCOUNTER — Emergency Department
Admission: EM | Admit: 2020-01-25 | Discharge: 2020-01-25 | Disposition: A | Discharge status: Home / Self Care | Payer: Self-pay | Attending: Student in an Organized Health Care Education/Training Program | Admitting: Student in an Organized Health Care Education/Training Program

## 2020-01-25 ENCOUNTER — Other Ambulatory Visit: Payer: Self-pay

## 2020-01-25 ENCOUNTER — Encounter: Payer: Self-pay | Admitting: Emergency Medicine

## 2020-01-25 DIAGNOSIS — Y9389 Activity, other specified: Secondary | ICD-10-CM | POA: Insufficient documentation

## 2020-01-25 DIAGNOSIS — Y999 Unspecified external cause status: Secondary | ICD-10-CM | POA: Insufficient documentation

## 2020-01-25 DIAGNOSIS — S61310A Laceration without foreign body of right index finger with damage to nail, initial encounter: Secondary | ICD-10-CM

## 2020-01-25 DIAGNOSIS — Y92090 Kitchen in other non-institutional residence as the place of occurrence of the external cause: Secondary | ICD-10-CM | POA: Insufficient documentation

## 2020-01-25 DIAGNOSIS — W260XXA Contact with knife, initial encounter: Secondary | ICD-10-CM | POA: Insufficient documentation

## 2020-01-25 DIAGNOSIS — F17210 Nicotine dependence, cigarettes, uncomplicated: Secondary | ICD-10-CM | POA: Insufficient documentation

## 2020-01-25 DIAGNOSIS — Z23 Encounter for immunization: Secondary | ICD-10-CM | POA: Insufficient documentation

## 2020-01-25 DIAGNOSIS — S91311A Laceration without foreign body, right foot, initial encounter: Secondary | ICD-10-CM | POA: Insufficient documentation

## 2020-01-25 MED ORDER — CEPHALEXIN 500 MG PO CAPS: 500.0000 mg | ORAL_CAPSULE | Freq: Four times a day (QID) | ORAL | 0 refills | Status: AC

## 2020-01-25 MED ORDER — LIDOCAINE HCL (PF) 1 % IJ SOLN
5.0000 mL | Freq: Once | INTRAMUSCULAR | Status: DC
  Filled 2020-01-25: qty 5

## 2020-01-25 MED ORDER — TETANUS-DIPHTH-ACELL PERTUSSIS 5-2.5-18.5 LF-MCG/0.5 IM SUSP
0.5000 mL | Freq: Once | INTRAMUSCULAR | Status: AC
  Administered 2020-01-25: 0.5 mL via INTRAMUSCULAR
  Filled 2020-01-25: qty 0.5

## 2020-01-25 NOTE — ED Triage Notes (Signed)
Pt to ED via POV stating that she has a laceration on her right index finger. Pt is in NAD. Bleeding is controlled at this time.

## 2020-01-25 NOTE — ED Provider Notes (Signed)
[facility] EMERGENCY DEPARTMENT Provider Note   CSN: [id number] Arrival date & time: 01/25/20  1749     History Chief Complaint  Patient presents with   Laceration    Marciel Offenberger is a 54 y.o. female.  Presents to the emergency department for evaluation of laceration to the right index finger.  Patient suffered a laceration just prior to arrival as she was cutting something in the kitchen with a knife.  She accidentally cut her right index finger along the radial aspect into the nail and nailbed.  She is unsure of her tetanus status.  Pain is mild to moderate.  HPI     Past Medical History:  Diagnosis Date   Abscess    to genitals    There are no problems to display for this patient.   Past Surgical History:  Procedure Laterality Date   HERNIA REPAIR       OB History   No obstetric history on file.     No family history on file.  Social History   Tobacco Use   Smoking status: Current Every Day Smoker    Packs/day: 0.50    Types: Cigarettes   Smokeless tobacco: Never Used  Substance Use Topics   Alcohol use: No   Drug use: No    Home Medications Prior to Admission medications   Medication Sig Start Date End Date Taking? Authorizing Provider  hydrocortisone-pramoxine (PROCTOFOAM HC) rectal foam Place 1 applicator rectally 2 (two) times daily. 05/30/13   Harriette Bouillon, MD    Allergies    Penicillins  Review of Systems   Review of Systems  Constitutional: Negative for fever.  Gastrointestinal: Negative for nausea and vomiting.  Skin: Positive for wound.  Neurological: Negative for numbness.    Physical Exam Updated Vital Signs BP (!) 142/96 (BP Location: Left Arm)    Pulse 99    Temp 99.1 F (37.3 C) (Oral)    Resp 20    Ht 5\' 6"  (1.676 m)    Wt 108.9 kg    LMP 05/15/2013    SpO2 94%    BMI 38.74 kg/m   Physical Exam Constitutional:      Appearance: She is well-developed.  HENT:     Head: Normocephalic  and atraumatic.  Eyes:     Conjunctiva/sclera: Conjunctivae normal.  Cardiovascular:     Rate and Rhythm: Normal rate.  Pulmonary:     Effort: Pulmonary effort is normal. No respiratory distress.  Musculoskeletal:        General: Normal range of motion.     Cervical back: Normal range of motion.     Comments: Right index finger with normal active range of motion.  Able to maintain full active extension.  She has a laceration going through the radial aspect of the nail fold, into the pulp space as well as into the nailbed and nail.  Laceration does not cross midline of the nail.  Sensation is intact distally.  No visible or palpable foreign body.  Skin:    General: Skin is warm.     Findings: No rash.  Neurological:     Mental Status: She is alert and oriented to person, place, and time.  Psychiatric:        Behavior: Behavior normal.        Thought Content: Thought content normal.     ED Results / Procedures / Treatments   Labs (all labs ordered are listed, but only abnormal results are displayed)  Labs Reviewed - No data to display  EKG None  Radiology No results found.  Procedures .Marland KitchenLaceration Repair  Date/Time: 01/25/2020 9:46 PM Performed by: Evon Slack, PA-C Authorized by: Evon Slack, PA-C   Consent:    Consent obtained:  Verbal   Consent given by:  Patient   Risks discussed:  Poor wound healing   Alternatives discussed:  No treatment Anesthesia (see MAR for exact dosages):    Anesthesia method:  Nerve block   Block location:  Right index finger   Block needle gauge:  25 G   Block anesthetic:  Lidocaine 1% w/o epi   Block injection procedure:  Anatomic landmarks identified and introduced needle   Block outcome:  Anesthesia achieved Laceration details:    Location: Right index finger radial aspect into the nailbed.   Length (cm):  2.5   Depth (mm):  2 Repair type:    Repair type:  Complex Pre-procedure details:    Preparation:  Patient was  prepped and draped in usual sterile fashion Exploration:    Wound extended: Distal portion of the nail was partially cut, the remainder of the distal nail was removed.  Laceration was present just along the distal aspect of the nailbed and this was repaired with two 6-0 Vicryl sutures.  Patient tolerated procedure well.  No expose.   Treatment:    Area cleansed with:  Betadine   Amount of cleaning:  Extensive   Irrigation solution:  Sterile saline Skin repair:    Repair method:  Sutures (Two 6-0 Vicryl sutures along the distal nailbed)   Suture size:  5-0   Suture material:  Nylon   Suture technique:  Simple interrupted   Number of sutures:  3 Approximation:    Approximation:  Close Post-procedure details:    Dressing:  Bulky dressing and non-adherent dressing   (including critical care time)  Medications Ordered in ED Medications  lidocaine (PF) (XYLOCAINE) 1 % injection 5 mL (has no administration in time range)  Tdap (BOOSTRIX) injection 0.5 mL (has no administration in time range)    ED Course  I have reviewed the triage vital signs and the nursing notes.  Pertinent labs & imaging results that were available during my care of the patient were reviewed by me and considered in my medical decision making (see chart for details).    MDM Rules/Calculators/A&P                          54 year old female with laceration to the right index finger.  Laceration did go through the distal portion of the nail and distal nailbed, laceration was thoroughly irrigated and repaired with two 6-0 Vicryl sutures and three 5-0 nylon sutures.  Tolerated procedure well with digital block.  Tetanus was updated she is placed on prophylactic antibiotics.  She is educated on wound care.  She understands signs symptoms return to ER for. Final Clinical Impression(s) / ED Diagnoses Final diagnoses:  Laceration of right index finger without foreign body with damage to nail, initial encounter    Rx / DC  Orders ED Discharge Orders    None       Ronnette Juniper 01/25/20 2149    Willy Eddy, MD 01/25/20 2243

## 2020-01-25 NOTE — Discharge Instructions (Addendum)
Please wear current dressing for 24 hours then apply Band-Aid daily.  Cleanse wound daily with peroxide or alcohol.  You may shower and get wet but do not submerge finger underwater, avoid dirty water.  Take Tylenol or ibuprofen as needed for pain.  Take antibiotic as prescribed.  3 nondissolvable sutures will need to be removed in 7 to 10 days.  Please call primary care provider to schedule follow-up appointment.  He had 2 dissolvable sutures through the nail that will dissolve on their own

## 2020-01-28 MED FILL — TAMOXIFEN CITRATE 20MG TABS: 20 mg | 90 days supply | Qty: 90 | Fill #1 | Status: AC

## 2020-02-05 ENCOUNTER — Other Ambulatory Visit: Admit: 2020-02-05 | Discharge: 2020-02-05 | Payer: PRIVATE HEALTH INSURANCE | Primary: Family Medicine

## 2020-02-05 DIAGNOSIS — E034 Atrophy of thyroid (acquired): Secondary | ICD-10-CM

## 2020-02-05 NOTE — Progress Notes (Signed)
Jill Kaufman is a 54 y.o. female had labs drawn today for labs per provider at [address] [place] [address].  The patient was drawn at  840am. In the   left arm and the patient tolerated the procedure fine with no issues.       Fredricka Bonine

## 2020-02-06 LAB — METABOLIC PANEL, COMPREHENSIVE
A-G Ratio: 1.7 (ref 1.2–2.2)
ALT (SGPT): 20 IU/L (ref 0–32)
AST (SGOT): 18 IU/L (ref 0–40)
Albumin: 4.3 g/dL (ref 3.8–4.9)
Alk. phosphatase: 58 IU/L (ref 48–121)
BUN/Creatinine ratio: 18 (ref 9–23)
BUN: 15 mg/dL (ref 6–24)
Bilirubin, total: 0.4 mg/dL (ref 0.0–1.2)
CO2: 23 mmol/L (ref 20–29)
Calcium: 9.2 mg/dL (ref 8.7–10.2)
Chloride: 103 mmol/L (ref 96–106)
Creatinine: 0.84 mg/dL (ref 0.57–1.00)
GFR est AA: 91 mL/min/{1.73_m2} (ref >59)
GFR est non-AA: 79 mL/min/{1.73_m2} (ref >59)
GLOBULIN, TOTAL: 2.5 g/dL (ref 1.5–4.5)
Glucose: 93 mg/dL (ref 65–99)
Potassium: 4 mmol/L (ref 3.5–5.2)
Protein, total: 6.8 g/dL (ref 6.0–8.5)
Sodium: 140 mmol/L (ref 134–144)

## 2020-02-06 LAB — LIPID PANEL
Cholesterol, Total: 190 mg/dL (ref 100–199)
Cholesterol, total: 190 mg/dL (ref 100–199)
HDL Cholesterol: 41 mg/dL (ref >39)
HDL: 41 mg/dL (ref >39)
LDL Calculated: 116 mg/dL — ABNORMAL HIGH (ref 0–99)
LDL, calculated: 116 mg/dL — ABNORMAL HIGH (ref 0–99)
Triglyceride: 188 mg/dL — ABNORMAL HIGH (ref 0–149)
Triglycerides: 188 mg/dL — ABNORMAL HIGH (ref 0–149)
VLDL, calculated: 33 mg/dL (ref 5–40)
VLDL: 33 mg/dL (ref 5–40)

## 2020-02-06 LAB — TSH 3RD GENERATION
TSH: 3.05 u[IU]/mL (ref 0.450–4.500)
TSH: 3.05 u[IU]/mL (ref 0.450–4.500)

## 2020-02-06 LAB — COMPREHENSIVE METABOLIC PANEL
ALT: 20 IU/L (ref 0–32)
AST: 18 IU/L (ref 0–40)
Albumin/Globulin Ratio: 1.7 NA (ref 1.2–2.2)
Albumin: 4.3 g/dL (ref 3.8–4.9)
Alkaline Phosphatase: 58 IU/L (ref 48–121)
BUN: 15 mg/dL (ref 6–24)
Bun/Cre Ratio: 18 NA (ref 9–23)
CO2: 23 mmol/L (ref 20–29)
Calcium: 9.2 mg/dL (ref 8.7–10.2)
Chloride: 103 mmol/L (ref 96–106)
Creatinine: 0.84 mg/dL (ref 0.57–1.00)
EGFR IF NonAfrican American: 79 mL/min/{1.73_m2} (ref >59)
GFR African American: 91 mL/min/{1.73_m2} (ref >59)
Globulin, Total: 2.5 g/dL (ref 1.5–4.5)
Glucose: 93 mg/dL (ref 65–99)
Potassium: 4 mmol/L (ref 3.5–5.2)
Sodium: 140 mmol/L (ref 134–144)
Total Bilirubin: 0.4 mg/dL (ref 0.0–1.2)
Total Protein: 6.8 g/dL (ref 6.0–8.5)

## 2020-02-12 ENCOUNTER — Ambulatory Visit: Attending: Family Medicine | Primary: Family Medicine

## 2020-02-12 ENCOUNTER — Ambulatory Visit
Admit: 2020-02-12 | Discharge: 2020-02-12 | Payer: PRIVATE HEALTH INSURANCE | Attending: Family Medicine | Primary: Family Medicine

## 2020-02-12 DIAGNOSIS — E7800 Pure hypercholesterolemia, unspecified: Secondary | ICD-10-CM

## 2020-02-12 NOTE — Progress Notes (Signed)
SUBJECTIVE:   Jill Kaufman is a 54 y.o. female who has a past medical history significant for hypothyroidism, high cholesterol, high triglycerides and breast cancer.  Current medications are listed in the EMR and reviewed today.  Review of systems reveals no complaints of chest pain, shortness of breath, orthopnea or PND.  GI and GU review of systems is unremarkable.  Patient continues to have intermittently mild lower extremity cramps.  She attributes these to both the tamoxifen and the Mevacor.  They are not significant enough that she would want anything specifically done about them.    HPI  See above    Past Medical History, Past Surgical History, Family history, Social History, and Medications were all reviewed with the patient today and updated as necessary.       Current Outpatient Medications   Medication Sig Dispense Refill   ??? tamoxifen (NOLVADEX) 20 mg tablet Take 1 Tablet by mouth daily. Indications: hormone receptor positive breast cancer 90 Tablet 3   ??? cholecalciferol, vitamin D3, (VITAMIN D3 PO) Take  by mouth.     ??? lovastatin (MEVACOR) 10 mg tablet Take 1 Tab by mouth daily. 90 Tab 3   ??? levothyroxine (SYNTHROID) 75 mcg tablet Take 1 Tab by mouth Daily (before breakfast). 90 Tab 3   ??? Soolantra 1 % crea        Allergies   Allergen Reactions   ??? Codeine Vertigo     Patient Active Problem List   Diagnosis Code   ??? Malignant neoplasm of central portion of right breast in female, estrogen receptor positive ([place]) C50.111, Z17.0   ??? S/P lumpectomy of breast Z98.890   ??? Abnormal breast finding N64.59     Past Medical History:   Diagnosis Date   ??? Breast cancer ([place])     T1cN0 right breast cancer   ??? GERD (gastroesophageal reflux disease)     OTC meds as needed    ??? High cholesterol    ??? Nausea & vomiting    ??? Thyroid disease 06/14/2011    Hypothyroidism      Past Surgical History:   Procedure Laterality Date   ??? HX BREAST BIOPSY Right 05/04/2018    RIGHT BREAST NEEDLE LOCALIZED BIOPSY performed by  Lorenda Cahill, MD at [place]   ??? HX BREAST LUMPECTOMY Right 05/04/2018    RIGHT BREAST LUMPECTOMY performed by Lorenda Cahill, MD at [place]   ??? HX GYN      hysterectomy partial-still has ovaries     Family History   Problem Relation Age of Onset   ??? Heart Attack Mother    ??? Cancer Father         throat cancer, skin cancer    ??? Diabetes Father    ??? Heart Attack Father      Social History     Tobacco Use   ??? Smoking status: Never Smoker   ??? Smokeless tobacco: Never Used   Substance Use Topics   ??? Alcohol use: Yes     Comment: occassional         Review of Systems  See above    OBJECTIVE:  Visit Vitals  BP 110/74 (BP 1 Location: Left upper arm, BP Patient Position: Sitting)   Ht 5\' 3"  (1.6 m)   Wt 150 lb (68 kg)   BMI 26.57 kg/m??        Physical Exam  Constitutional:       Appearance: She is well-developed.  HENT:      Head: Normocephalic and atraumatic.   Eyes:      Pupils: Pupils are equal, round, and reactive to light.   Neck:      Thyroid: No thyromegaly.      Vascular: No JVD.   Cardiovascular:      Rate and Rhythm: Normal rate and regular rhythm.      Heart sounds: Normal heart sounds. No murmur heard.   No friction rub. No gallop.    Pulmonary:      Effort: Pulmonary effort is normal. No respiratory distress.      Breath sounds: No wheezing or rales.   Abdominal:      Palpations: Abdomen is soft.      Tenderness: There is no abdominal tenderness. There is no guarding or rebound.   Musculoskeletal:         General: Normal range of motion.      Cervical back: Normal range of motion and neck supple.   Skin:     Findings: No rash.   Neurological:      Mental Status: She is alert and oriented to person, place, and time.         Medical problems and test results were reviewed with the patient today.         ASSESSMENT and PLAN    1.  High cholesterol.  LDL is 116.  Previously 136.  Continue statin therapy.  Cramps are minimal.  Tonic water as needed.  If worsens she will let me know.    2.  High  triglycerides.  Triglycerides 188.  Previously 158.  Dietary focus.    3.  Hypothyroidism.  TSH is 3.0.  Continue Synthroid.    4.  History of breast cancer.  Continue follow-up with oncology.    Elements of this note have been dictated using speech recognition software. As a result, errors of speech recognition may have occurred.

## 2020-02-19 LAB — BE WELL HEALTH SCREEN
Cholesterol, Total: 193 MG/DL (ref <200)
Cholesterol, total: 193 MG/DL (ref <200)
Glucose: 88 mg/dL (ref 65–100)
Glucose: 88 mg/dL (ref 65–100)
HDL Cholesterol: 48 MG/DL (ref 40–60)
HDL: 48 MG/DL (ref 40–60)
LDL Calculated: 115.6 MG/DL — ABNORMAL HIGH (ref <100)
LDL, calculated: 115.6 MG/DL — ABNORMAL HIGH (ref <100)
Triglyceride: 147 MG/DL (ref 35–150)
Triglycerides: 147 MG/DL (ref 35–150)

## 2020-03-19 ENCOUNTER — Encounter

## 2020-03-19 ENCOUNTER — Inpatient Hospital Stay: Admit: 2020-03-19 | Payer: PRIVATE HEALTH INSURANCE | Attending: Internal Medicine | Primary: Family Medicine

## 2020-03-19 DIAGNOSIS — C50911 Malignant neoplasm of unspecified site of right female breast: Secondary | ICD-10-CM

## 2020-04-27 MED FILL — TAMOXIFEN CITRATE 20MG TABS: 20 mg | 90 days supply | Qty: 90 | Fill #2 | Status: AC

## 2020-05-12 ENCOUNTER — Encounter

## 2020-05-13 ENCOUNTER — Ambulatory Visit: Attending: Internal Medicine | Primary: Family Medicine

## 2020-05-13 ENCOUNTER — Ambulatory Visit
Admit: 2020-05-13 | Discharge: 2020-05-13 | Payer: PRIVATE HEALTH INSURANCE | Attending: Internal Medicine | Primary: Family Medicine

## 2020-05-13 ENCOUNTER — Inpatient Hospital Stay: Admit: 2020-05-13 | Payer: PRIVATE HEALTH INSURANCE | Primary: Family Medicine

## 2020-05-13 DIAGNOSIS — C50911 Malignant neoplasm of unspecified site of right female breast: Secondary | ICD-10-CM

## 2020-05-13 LAB — CBC WITH AUTOMATED DIFF
ABS. BASOPHILS: 0.1 10*3/uL (ref 0.0–0.2)
ABS. EOSINOPHILS: 0.1 10*3/uL (ref 0.0–0.8)
ABS. IMM. GRANS.: 0 10*3/uL (ref 0.0–0.5)
ABS. LYMPHOCYTES: 1.9 10*3/uL (ref 0.5–4.6)
ABS. MONOCYTES: 0.4 10*3/uL (ref 0.1–1.3)
ABS. NEUTROPHILS: 4.9 10*3/uL (ref 1.7–8.2)
ABSOLUTE NRBC: 0 10*3/uL (ref 0.0–0.2)
BASOPHILS: 1 % (ref 0.0–2.0)
EOSINOPHILS: 2 % (ref 0.5–7.8)
HCT: 36.8 % (ref 35.8–46.3)
HGB: 12.7 g/dL (ref 11.7–15.4)
IMMATURE GRANULOCYTES: 0 % (ref 0.0–5.0)
LYMPHOCYTES: 25 % (ref 13–44)
MCH: 30.6 PG (ref 26.1–32.9)
MCHC: 34.5 g/dL (ref 31.4–35.0)
MCV: 88.7 FL (ref 79.6–97.8)
MONOCYTES: 6 % (ref 4.0–12.0)
MPV: 10.1 FL (ref 9.4–12.3)
NEUTROPHILS: 66 % (ref 43–78)
PLATELET: 234 10*3/uL (ref 150–450)
RBC: 4.15 M/uL (ref 4.05–5.2)
RDW: 12.3 % (ref 11.9–14.6)
WBC: 7.3 10*3/uL (ref 4.3–11.1)

## 2020-05-13 LAB — METABOLIC PANEL, COMPREHENSIVE
A-G Ratio: 0.9 — ABNORMAL LOW (ref 1.2–3.5)
ALT (SGPT): 38 U/L (ref 12–65)
AST (SGOT): 22 U/L (ref 15–37)
Albumin: 3.5 g/dL (ref 3.5–5.0)
Alk. phosphatase: 64 U/L (ref 50–136)
Anion gap: 4 mmol/L — ABNORMAL LOW (ref 7–16)
BUN: 16 MG/DL (ref 6–23)
Bilirubin, total: 0.3 MG/DL (ref 0.2–1.1)
CO2: 28 mmol/L (ref 21–32)
Calcium: 8.7 MG/DL (ref 8.3–10.4)
Chloride: 107 mmol/L (ref 98–107)
Creatinine: 1 MG/DL (ref 0.6–1.0)
GFR est AA: >60 mL/min/{1.73_m2} (ref >60)
GFR est non-AA: >60 mL/min/{1.73_m2} (ref >60)
Globulin: 3.7 g/dL — ABNORMAL HIGH (ref 2.3–3.5)
Glucose: 122 mg/dL — ABNORMAL HIGH (ref 65–100)
Potassium: 3.6 mmol/L (ref 3.5–5.1)
Protein, total: 7.2 g/dL (ref 6.3–8.2)
Sodium: 139 mmol/L (ref 136–145)

## 2020-05-13 LAB — CBC WITH AUTO DIFFERENTIAL
Basophils %: 1 % (ref 0.0–2.0)
Basophils Absolute: 0.1 10*3/uL (ref 0.0–0.2)
Eosinophils %: 2 % (ref 0.5–7.8)
Eosinophils Absolute: 0.1 10*3/uL (ref 0.0–0.8)
Granulocyte Absolute Count: 0 10*3/uL (ref 0.0–0.5)
Hematocrit: 36.8 % (ref 35.8–46.3)
Hemoglobin: 12.7 g/dL (ref 11.7–15.4)
Immature Granulocytes: 0 % (ref 0.0–5.0)
Lymphocytes %: 25 % (ref 13–44)
Lymphocytes Absolute: 1.9 10*3/uL (ref 0.5–4.6)
MCH: 30.6 PG (ref 26.1–32.9)
MCHC: 34.5 g/dL (ref 31.4–35.0)
MCV: 88.7 FL (ref 79.6–97.8)
MPV: 10.1 FL (ref 9.4–12.3)
Monocytes %: 6 % (ref 4.0–12.0)
Monocytes Absolute: 0.4 10*3/uL (ref 0.1–1.3)
NRBC Absolute: 0 10*3/uL (ref 0.0–0.2)
Neutrophils %: 66 % (ref 43–78)
Neutrophils Absolute: 4.9 10*3/uL (ref 1.7–8.2)
Platelets: 234 10*3/uL (ref 150–450)
RBC: 4.15 M/uL (ref 4.05–5.2)
RDW: 12.3 % (ref 11.9–14.6)
WBC: 7.3 10*3/uL (ref 4.3–11.1)

## 2020-05-13 LAB — COMPREHENSIVE METABOLIC PANEL
ALT: 38 U/L (ref 12–65)
AST: 22 U/L (ref 15–37)
Albumin/Globulin Ratio: 0.9 — ABNORMAL LOW (ref 1.2–3.5)
Albumin: 3.5 g/dL (ref 3.5–5.0)
Alkaline Phosphatase: 64 U/L (ref 50–136)
Anion Gap: 4 mmol/L — ABNORMAL LOW (ref 7–16)
BUN: 16 MG/DL (ref 6–23)
CO2: 28 mmol/L (ref 21–32)
Calcium: 8.7 MG/DL (ref 8.3–10.4)
Chloride: 107 mmol/L (ref 98–107)
Creatinine: 1 MG/DL (ref 0.6–1.0)
EGFR IF NonAfrican American: >60 mL/min/{1.73_m2} (ref >60)
GFR African American: >60 mL/min/{1.73_m2} (ref >60)
Globulin: 3.7 g/dL — ABNORMAL HIGH (ref 2.3–3.5)
Glucose: 122 mg/dL — ABNORMAL HIGH (ref 65–100)
Potassium: 3.6 mmol/L (ref 3.5–5.1)
Sodium: 139 mmol/L (ref 136–145)
Total Bilirubin: 0.3 MG/DL (ref 0.2–1.1)
Total Protein: 7.2 g/dL (ref 6.3–8.2)

## 2020-05-13 NOTE — Progress Notes (Signed)
Progress Notes by Greer Ee, MD at 05/13/20 1530                Author: Greer Ee, MD  Service: --  Author Type: Physician       Filed: 05/21/20 1839  Encounter Date: 05/13/2020  Status: Signed          Editor: Greer Ee, MD (Physician)               [facility] Hematology and Oncology: Office Visit Established Patient      Chief Complaint:       Chief Complaint       Patient presents with        ?  Follow-up        History of Present Illness:   Jill Kaufman is a  54 y.o. female who presents today for evaluation regarding breast cancer.  She underwent  routine mammogram in September 2019 which showed an irregular mass density in the upper outer right breast.  She underwent ultrasound and biopsy which showed invasive ductal carcinoma, grade 2, ER 95%, PR 95%, HER2 negative.  MRI showed the lesion to  be 1.5 cm with no other abnormalities.  She was referred to Dr. Domenica Fail and underwent lumpectomy and sentinel node biopsy on 05/04/18 which showed 1.6 cm of tumor with negative margins and 1 sentinel lymph node negative for tumor.  Oncotype Dx testing  was low risk at 6; therefore, chemotherapy can be safely deferred.  We will refer her for radiation therapy, then begin endocrine therapy.  She has completed hysterectomy but her ovaries are intact,  her FSH/LH/E2 levels were borderline so we felt it would be prudent to proceed with tamoxifen for 2-3 years, then transition to AI once she is clearly post-menopausal.        She returns for routine follow-up.  She continues to tolerate tamoxifen very well.  There are no GI or bowel complaints.  Appetite is good and weight is stable.  Good energy level.  No shortness of breath, chest pain, or edema.  No breast changes, no  suspicious mass or lump.  She continues to have intermittent hot flashes but is not interested in therapy to address this.         Review of Systems:   Constitutional: Positive for hot flashes.    HENT: Negative.    Eyes: Negative.     Respiratory: Negative.    Cardiovascular: Negative.    Gastrointestinal: Negative.    Genitourinary: Negative.    Musculoskeletal: Negative.    Skin: Negative.    Neurological: Negative.    Endo/Heme/Allergies: Negative.    Psychiatric/Behavioral: Negative.    All other systems reviewed and are negative.         Allergies        Allergen  Reactions         ?  Codeine  Vertigo          Past Medical History:        Diagnosis  Date         ?  Breast cancer ([place])            T1cN0 right breast cancer         ?  GERD (gastroesophageal reflux disease)            OTC meds as needed          ?  High cholesterol       ?  Nausea & vomiting       ?  Thyroid disease  06/14/2011          Hypothyroidism           Past Surgical History:         Procedure  Laterality  Date          ?  HX BREAST BIOPSY  Right  05/04/2018          RIGHT BREAST NEEDLE LOCALIZED BIOPSY performed by Lorenda Cahill, MD at [place]          ?  HX BREAST LUMPECTOMY  Right  05/04/2018          RIGHT BREAST LUMPECTOMY performed by Lorenda Cahill, MD at [place]          ?  HX GYN              hysterectomy partial-still has ovaries          Family History         Problem  Relation  Age of Onset          ?  Heart Attack  Mother       ?  Cancer  Father                throat cancer, skin cancer           ?  Diabetes  Father            ?  Heart Attack  Father            Social History          Socioeconomic History         ?  Marital status:  MARRIED              Spouse name:  Not on file         ?  Number of children:  Not on file     ?  Years of education:  Not on file     ?  Highest education level:  Not on file       Occupational History        ?  Not on file       Tobacco Use         ?  Smoking status:  Never Smoker     ?  Smokeless tobacco:  Never Used       Substance and Sexual Activity         ?  Alcohol use:  Yes             Comment: occassional         ?  Drug use:  Not Currently     ?  Sexual activity:  Not on file        Other Topics   Concern         ?  Military Service  Not Asked     ?  Blood Transfusions  Not Asked     ?  Caffeine Concern  Not Asked     ?  Occupational Exposure  Not Asked     ?  Hobby Hazards  Not Asked     ?  Sleep Concern  Not Asked     ?  Stress Concern  Not Asked     ?  Weight Concern  Not Asked     ?  Special Diet  Not Asked     ?  Back Care  Not Asked     ?  Exercise  Not Asked     ?  Bike Helmet  Not Asked     ?  Seat Belt  Not Asked     ?  Self-Exams  Not Asked       Social History Narrative        ?  Not on file          Social Determinants of Health          Financial Resource Strain:         ?  Difficulty of Paying Living Expenses: Not on file       Food Insecurity:         ?  Worried About Running Out of Food in the Last Year: Not on file     ?  Ran Out of Food in the Last Year: Not on file       Transportation Needs:         ?  Lack of Transportation (Medical): Not on file     ?  Lack of Transportation (Non-Medical): Not on file       Physical Activity:         ?  Days of Exercise per Week: Not on file     ?  Minutes of Exercise per Session: Not on file       Stress:         ?  Feeling of Stress : Not on file       Social Connections:         ?  Frequency of Communication with Friends and Family: Not on file     ?  Frequency of Social Gatherings with Friends and Family: Not on file     ?  Attends Religious Services: Not on file     ?  Active Member of Clubs or Organizations: Not on file     ?  Attends Archivist Meetings: Not on file     ?  Marital Status: Not on file       Intimate Partner Violence:         ?  Fear of Current or Ex-Partner: Not on file     ?  Emotionally Abused: Not on file     ?  Physically Abused: Not on file     ?  Sexually Abused: Not on file       Housing Stability:         ?  Unable to Pay for Housing in the Last Year: Not on file     ?  Number of Places Lived in the Last Year: Not on file        ?  Unstable Housing in the Last Year: Not on file          Current Outpatient  Medications          Medication  Sig  Dispense  Refill           ?  tamoxifen (NOLVADEX) 20 mg tablet  Take 1 Tablet by mouth daily. Indications: hormone receptor positive breast cancer  90 Tablet  3     ?  cholecalciferol, vitamin D3, (VITAMIN D3 PO)  Take  by mouth.         ?  lovastatin (MEVACOR) 10 mg tablet  Take 1 Tab by mouth daily.  90 Tab  3     ?  levothyroxine (SYNTHROID) 75  mcg tablet  Take 1 Tab by mouth Daily (before breakfast).  90 Tab  3           ?  Soolantra 1 % crea                 OBJECTIVE:   Visit Vitals       BP  122/80  Comment: standing        Pulse  80     Ht  5' 3" (1.6 m)     Wt  152 lb 11.2 oz (69.3 kg)     SpO2  97%        BMI  27.05 kg/m??               Constitutional:  Well developed, well nourished female in no acute  distress, sitting comfortably in the exam room chair.         HEENT:  Normocephalic and atraumatic. Sclerae anicteric. Neck supple without JVD. No thyromegaly present.      Lymph node     No palpable submandibular, cervical, supraclavicular, axillary lymph nodes.     Skin  Warm and dry.  No bruising and no rash noted.  No erythema.  No pallor.        Respiratory  Lungs are clear to auscultation bilaterally without wheezes, rales or rhonchi, normal air exchange without accessory muscle use.      CVS  Normal rate, regular rhythm and normal S1 and S2.  No murmurs, gallops, or rubs.     Abdomen  Soft, nontender and nondistended, normoactive bowel sounds.  No palpable mass.     Neuro  Grossly nonfocal with no obvious sensory or motor deficits.     MSK  Normal range of motion in general.  No edema and no tenderness.     Psych  Appropriate mood and affect.          Labs:     Recent Results (from the past 24 hour(s))     CBC WITH AUTOMATED DIFF          Collection Time: 05/13/20  3:09 PM         Result  Value  Ref Range            WBC  7.3  4.3 - 11.1 K/uL       RBC  4.15  4.05 - 5.2 M/uL       HGB  12.7  11.7 - 15.4 g/dL       HCT  36.8  35.8 - 46.3 %       MCV  88.7  79.6 -  97.8 FL       MCH  30.6  26.1 - 32.9 PG       MCHC  34.5  31.4 - 35.0 g/dL       RDW  12.3  11.9 - 14.6 %       PLATELET  234  150 - 450 K/uL       MPV  10.1  9.4 - 12.3 FL       ABSOLUTE NRBC  0.00  0.0 - 0.2 K/uL       DF  AUTOMATED          NEUTROPHILS  66  43 - 78 %       LYMPHOCYTES  25  13 - 44 %       MONOCYTES  6  4.0 - 12.0 %       EOSINOPHILS  2  0.5 - 7.8 %       BASOPHILS  1  0.0 - 2.0 %       IMMATURE GRANULOCYTES  0  0.0 - 5.0 %       ABS. NEUTROPHILS  4.9  1.7 - 8.2 K/UL       ABS. LYMPHOCYTES  1.9  0.5 - 4.6 K/UL       ABS. MONOCYTES  0.4  0.1 - 1.3 K/UL       ABS. EOSINOPHILS  0.1  0.0 - 0.8 K/UL       ABS. BASOPHILS  0.1  0.0 - 0.2 K/UL       ABS. IMM. GRANS.  0.0  0.0 - 0.5 K/UL       METABOLIC PANEL, COMPREHENSIVE          Collection Time: 05/13/20  3:09 PM         Result  Value  Ref Range            Sodium  139  136 - 145 mmol/L       Potassium  3.6  3.5 - 5.1 mmol/L       Chloride  107  98 - 107 mmol/L       CO2  28  21 - 32 mmol/L       Anion gap  4 (L)  7 - 16 mmol/L       Glucose  122 (H)  65 - 100 mg/dL       BUN  16  6 - 23 MG/DL       Creatinine  1.00  0.6 - 1.0 MG/DL       GFR est AA  >60  >60 ml/min/1.47m       GFR est non-AA  >60  >60 ml/min/1.767m      Calcium  8.7  8.3 - 10.4 MG/DL       Bilirubin, total  0.3  0.2 - 1.1 MG/DL       ALT (SGPT)  38  12 - 65 U/L       AST (SGOT)  22  15 - 37 U/L       Alk. phosphatase  64  50 - 136 U/L       Protein, total  7.2  6.3 - 8.2 g/dL       Albumin  3.5  3.5 - 5.0 g/dL       Globulin  3.7 (H)  2.3 - 3.5 g/dL            A-G Ratio  0.9 (L)  1.2 - 3.5             Imaging:   MRI of the Breasts with and without contrast   ??   CLINICAL INDICATION:  New diagnosis of right 9:00 breast cancer status post   biopsy on 10/1/2019at [facility] demonstrating invasive ductal   carcinoma grade 2. Evaluate for extent of disease, staging, therapy planning. No   reported personal malignancy or breast surgery otherwise, and no family breast   or  ovarian cancer.   ??   COMPARISON: [facility] 03/07/2018, 02/27/2018   ??   TECHNIQUE: Standard MRI sequences were obtained through the breasts in multiple   planes. Images were obtained before and after intravenous infusion of 13 mL of   Dotarem contrast. Images were reviewed with PACS and with Dynacad CAD software.   ??  FINDINGS: The breasts demonstrate moderate bilateral glandularity, and moderate   bilateral background enhancement which could limit accuracy. There are   innumerable tiny enhancing foci and a few scattered small cysts on each side.   ??   Right lateral 9:00 biopsy site demonstrates a clip within an irregular   heterogeneously enhancing mass measuring up to 1.3 x 1.0 x 1.5 cm.   ??   There is no evidence of suspicious enhancing mass, and no dominant or unique   nonmass enhancement, to suggest additional malignancy in either breast.  There   is no evidence of axillary or internal mammary lymphadenopathy. Chest wall   signal is normal.    ??   ??   IMPRESSION   IMPRESSION:    1. Right 9:00 breast cancer measures up to 1.5 cm.   2. No additional discrete abnormality in either breast. Moderate background   enhancement.   3. No evidence of lymphadenopathy or chest wall abnormalities.   ??   Recommend continued management as directed clinically. Annual bilateral   mammogram will be due in 02/20/2019. This patient would likely benefit from   supplemental breast MRI in one year as well.   ??   BI-RADS Assessment Category 6: Known Biopsy Proven Malignancy   ??   ??   Pathology:                      ASSESSMENT:             ICD-10-CM  ICD-9-CM             1.  Malignant neoplasm of right breast in female, estrogen receptor positive, unspecified site of breast ([place])   C50.911  174.9  MRI BREAST BI W WO CONT            Z17.0  V86.0  CBC WITH AUTOMATED DIFF           METABOLIC PANEL, COMPREHENSIVE           ESTRADIOL           FOLLICLE STIMULATING  HORMONE                LUTEINIZING HORMONE           Problem List   Date Reviewed:  2020-02-20                        Codes  Class  Noted             Abnormal breast finding  ICD-10-CM: N64.59   ICD-9-CM: 796.4    11/01/2018                       S/P lumpectomy of breast  ICD-10-CM: Z98.890   ICD-9-CM: V45.89    05/17/2018                       Malignant neoplasm of central portion of right breast in female, estrogen receptor positive ([place])  ICD-10-CM: C50.111, Z17.0   ICD-9-CM: 174.1, V86.0    04/12/2018                             PLAN:   Lab studies were personally reviewed.      Breast cancer: 1.6 cm, grade 2 IDC, sentinel lymph node biopsy negative, ER 95%, PR 95%, HER2 negative.  S/p lumpectomy with negative  margins.  She has completed definitive surgery.  Because of  the ER/PR positivity, the negative nodal assessment, and the T1 tumor, she would be an excellent candidate for Oncotype Dx for risk stratification, which was low risk at 6.  Therefore, chemotherapy can be safely deferred.  We will refer her for radiation  therapy, then begin endocrine therapy.  She has completed hysterectomy but her ovaries are intact, her FSH/LH/E2 levels were borderline so we felt it would be prudent to proceed with tamoxifen for 2-3 years, then transition to AI once she is clearly post-menopausal.   We discussed side effects of tamoxifen including the risk of VTE, as well as vasomotor symptoms and arthralgias.  She started tamoxifen after radiation was completed.        She returns for routine follow-up.  She continues to tolerate tamoxifen very well.  There are no GI or bowel complaints.  Appetite is good and weight is stable.  Good energy level.   No shortness of breath, chest pain, or edema.  No breast changes, no suspicious mass or lump.  She continues to have intermittent hot flashes but is not interested in therapy for management.   Labs reviewed and unremarkable.  Mammogram reviewed from  October 2021 and negative.  She  will continue with tamoxifen as planned.  She will be due for MRI for early spring 2022 to alternate with mammogram.  Recheck endocrine labs in 2022 to monitor for menopause and consider change to AI if post-menopausal.   All questions were asked and answered to the best of my ability.  F/u in 6 months for recheck.                   Greer Ee, MD   [facility] [place] [address]   Office : [phone]   Fax : [phone]

## 2020-06-16 ENCOUNTER — Ambulatory Visit: Attending: Family | Primary: Family Medicine

## 2020-06-16 ENCOUNTER — Encounter

## 2020-06-16 ENCOUNTER — Ambulatory Visit: Admit: 2020-06-16 | Payer: BLUE CROSS/BLUE SHIELD | Attending: Family | Primary: Family Medicine

## 2020-06-16 DIAGNOSIS — J069 Acute upper respiratory infection, unspecified: Secondary | ICD-10-CM

## 2020-06-16 LAB — AMB POC RAPID INFLUENZA TEST
Influenza A Ag POC: NEGATIVE
Influenza A Antigen, POC: NEGATIVE
Influenza B Ag POC: NEGATIVE
Influenza B Antigen, POC: NEGATIVE

## 2020-06-16 MED ORDER — PREDNISONE 10 MG TAB
10 mg | ORAL_TABLET | Freq: Every day | ORAL | 0 refills | Status: AC
Start: 2020-06-16 — End: 2020-06-21

## 2020-06-16 NOTE — Progress Notes (Signed)
HISTORY OF PRESENT ILLNESS  Jill Kaufman is a 55 y.o. female.  Presenting today with URI complaints.     Started yesterday with new onset rhinorrhea, sore throat, congestion, cough, body aches, and fatigue. She has been taking Dayquil/Nyquil, helping some. Denies any documented fever.    She is fully COVID19 vaccinated. Denies any known exposure.            Review of Systems   Constitutional: Positive for malaise/fatigue. Negative for chills, diaphoresis and fever.   HENT: Positive for congestion and sore throat. Negative for ear discharge, ear pain, hearing loss, nosebleeds, sinus pain and tinnitus.    Eyes: Negative.    Respiratory: Positive for cough. Negative for hemoptysis, sputum production, shortness of breath, wheezing and stridor.    Cardiovascular: Negative.  Negative for chest pain.   Gastrointestinal: Negative for abdominal pain, diarrhea, nausea and vomiting.   Musculoskeletal: Positive for myalgias.   Skin: Negative.  Negative for itching and rash.   Neurological: Negative.  Negative for dizziness and headaches.       Past Medical History:   Diagnosis Date   ??? Breast cancer (HCC)     T1cN0 right breast cancer   ??? GERD (gastroesophageal reflux disease)     OTC meds as needed    ??? High cholesterol    ??? Nausea & vomiting    ??? Thyroid disease 06/14/2011    Hypothyroidism      Social History     Socioeconomic History   ??? Marital status: MARRIED     Spouse name: Not on file   ??? Number of children: Not on file   ??? Years of education: Not on file   ??? Highest education level: Not on file   Occupational History   ??? Not on file   Tobacco Use   ??? Smoking status: Never Smoker   ??? Smokeless tobacco: Never Used   Substance and Sexual Activity   ??? Alcohol use: Yes     Comment: occassional   ??? Drug use: Not Currently   ??? Sexual activity: Not on file   Other Topics Concern   ??? Military Service Not Asked   ??? Blood Transfusions Not Asked   ??? Caffeine Concern Not Asked   ??? Occupational Exposure Not Asked   ??? Hobby Hazards  Not Asked   ??? Sleep Concern Not Asked   ??? Stress Concern Not Asked   ??? Weight Concern Not Asked   ??? Special Diet Not Asked   ??? Back Care Not Asked   ??? Exercise Not Asked   ??? Bike Helmet Not Asked   ??? Seat Belt Not Asked   ??? Self-Exams Not Asked   Social History Narrative   ??? Not on file     Social Determinants of Health     Financial Resource Strain:    ??? Difficulty of Paying Living Expenses: Not on file   Food Insecurity:    ??? Worried About Running Out of Food in the Last Year: Not on file   ??? Ran Out of Food in the Last Year: Not on file   Transportation Needs:    ??? Lack of Transportation (Medical): Not on file   ??? Lack of Transportation (Non-Medical): Not on file   Physical Activity:    ??? Days of Exercise per Week: Not on file   ??? Minutes of Exercise per Session: Not on file   Stress:    ??? Feeling of Stress : Not on file  Social Connections:    ??? Frequency of Communication with Friends and Family: Not on file   ??? Frequency of Social Gatherings with Friends and Family: Not on file   ??? Attends Religious Services: Not on file   ??? Active Member of Clubs or Organizations: Not on file   ??? Attends Archivist Meetings: Not on file   ??? Marital Status: Not on file   Intimate Partner Violence:    ??? Fear of Current or Ex-Partner: Not on file   ??? Emotionally Abused: Not on file   ??? Physically Abused: Not on file   ??? Sexually Abused: Not on file   Housing Stability:    ??? Unable to Pay for Housing in the Last Year: Not on file   ??? Number of Places Lived in the Last Year: Not on file   ??? Unstable Housing in the Last Year: Not on file     Family History   Problem Relation Age of Onset   ??? Heart Attack Mother    ??? Cancer Father         throat cancer, skin cancer    ??? Diabetes Father    ??? Heart Attack Father      Past Surgical History:   Procedure Laterality Date   ??? HX BREAST BIOPSY Right 05/04/2018    RIGHT BREAST NEEDLE LOCALIZED BIOPSY performed by Lorenda Cahill, MD at [facility]   ??? HX BREAST LUMPECTOMY Right  05/04/2018    RIGHT BREAST LUMPECTOMY performed by Lorenda Cahill, MD at [place]   ??? HX GYN      hysterectomy partial-still has ovaries       Immunization History   Administered Date(s) Administered   ??? COVID-19, PFIZER, MRNA, LNP-S, PF, 30MCG/0.3ML DOSE 06/24/2019, 06/24/2019, 07/15/2019, 07/15/2019   ??? Influenza Vaccine 04/09/2019   ??? Tdap 09/27/2013     Current Outpatient Medications   Medication Sig Dispense Refill   ??? predniSONE (DELTASONE) 10 mg tablet Take 40 mg by mouth daily (with breakfast) for 5 days. 20 Tablet 0   ??? tamoxifen (NOLVADEX) 20 mg tablet Take 1 Tablet by mouth daily. Indications: hormone receptor positive breast cancer 90 Tablet 3   ??? cholecalciferol, vitamin D3, (VITAMIN D3 PO) Take  by mouth.     ??? lovastatin (MEVACOR) 10 mg tablet Take 1 Tab by mouth daily. 90 Tab 3   ??? levothyroxine (SYNTHROID) 75 mcg tablet Take 1 Tab by mouth Daily (before breakfast). 90 Tab 3   ??? Soolantra 1 % crea        Allergies   Allergen Reactions   ??? Codeine Vertigo       Physical Exam  Vitals reviewed.   Constitutional:       General: She is awake. She is not in acute distress.     Appearance: Normal appearance. She is well-developed and well-groomed. She is not ill-appearing or toxic-appearing.   HENT:      Head: Normocephalic and atraumatic.      Jaw: There is normal jaw occlusion.      Right Ear: Tympanic membrane, ear canal and external ear normal.      Left Ear: Tympanic membrane, ear canal and external ear normal.      Nose: Mucosal edema and congestion present. No rhinorrhea.      Right Turbinates: Enlarged. Not swollen or pale.      Left Turbinates: Enlarged. Not swollen or pale.      Right  Sinus: No maxillary sinus tenderness or frontal sinus tenderness.      Left Sinus: No maxillary sinus tenderness or frontal sinus tenderness.      Mouth/Throat:      Lips: Pink.      Mouth: Mucous membranes are moist.      Pharynx: Oropharynx is clear. Uvula midline.      Tonsils: 1+ on the right. 1+ on the  left.   Eyes:      General: Lids are normal.      Extraocular Movements: Extraocular movements intact.      Conjunctiva/sclera: Conjunctivae normal.      Pupils: Pupils are equal, round, and reactive to light.   Neck:      Trachea: Trachea and phonation normal.   Cardiovascular:      Rate and Rhythm: Normal rate and regular rhythm.      Pulses: Normal pulses.      Heart sounds: Normal heart sounds, S1 normal and S2 normal.   Pulmonary:      Effort: Pulmonary effort is normal.      Breath sounds: Normal breath sounds and air entry.   Musculoskeletal:         General: Normal range of motion.      Cervical back: Full passive range of motion without pain.   Lymphadenopathy:      Cervical: No cervical adenopathy.   Skin:     General: Skin is warm and dry.      Capillary Refill: Capillary refill takes less than 2 seconds.   Neurological:      General: No focal deficit present.      Mental Status: She is alert and oriented to person, place, and time.   Psychiatric:         Mood and Affect: Mood normal.         Behavior: Behavior normal. Behavior is cooperative.         Thought Content: Thought content normal.         Visit Vitals  BP (!) 140/82 (BP 1 Location: Right arm, BP Patient Position: Sitting, BP Cuff Size: Adult)   Pulse 82   Temp 98.2 ??F (36.8 ??C) (Temporal)   Resp 16   SpO2 97%     Recent Results (from the past 24 hour(s))   AMB POC RAPID INFLUENZA TEST    Collection Time: 06/16/20 10:59 AM   Result Value Ref Range    VALID INTERNAL CONTROL POC Yes     Influenza A Ag POC Negative Negative    Influenza B Ag POC Negative Negative     ASSESSMENT and PLAN  Diagnoses and all orders for this visit:    1. Upper respiratory tract infection, unspecified type    Other orders  -     predniSONE (DELTASONE) 10 mg tablet; Take 40 mg by mouth daily (with breakfast) for 5 days.      Rapid flu NEGATIVE.   Following [facility] policy, needs COVID testing. I cannot offer this on-site. Given resource to call RN [facility] for COVID19  testing. [facility] is to call her with her PCR results. She is not to return to work until [employer] clears her.     Rx prednisone to keep on hand if cough worsens or poorly controlled with Dayquil/Nyquil.  May continue Dayquil/Nyquil.    Patient Education:  May use cool-mist humidifier/Vaporizer at bedside/living quarters. Recommended saline rinses with OceanMist spray or using NettiPot. Encouraged increase fluid intake for adequate hydration. Reviewed  good hand hygiene.  Tylenol/Ibuprofen for aches/pains.  Throat lozenges, honey, or warm salt water gargles for sore throat.  Informed symptoms should last for 5-7 days but may last up to 2 weeks. Cough may persist after cold symptoms resolve.    Risks and benefits of medications reviewed with patient .    Follow up: 5 days if not any better or worse or development of high fever.  Seek ER care for chest pain or SOB.    Patient voices understanding and agrees with the plan of care as described above.

## 2020-06-17 ENCOUNTER — Institutional Professional Consult (permissible substitution): Payer: BLUE CROSS/BLUE SHIELD | Primary: Family Medicine

## 2020-06-17 DIAGNOSIS — Z20822 Contact with and (suspected) exposure to covid-19: Secondary | ICD-10-CM

## 2020-06-17 NOTE — Progress Notes (Signed)
Patient presented to the consolidated drive thru for covid testing as ordered. After verbal consent was given by patient/caregiver, nasal swab obtained and sent to lab for processing.

## 2020-06-19 LAB — NOVEL CORONAVIRUS (COVID-19): SARS-CoV-2, NAA: DETECTED — AB

## 2020-06-19 LAB — SARS-COV-2, NAA 2 DAY TAT

## 2020-06-19 LAB — COVID-19: SARS-CoV-2, NAA: DETECTED — AB

## 2020-06-23 ENCOUNTER — Telehealth: Attending: Family Medicine | Primary: Family Medicine

## 2020-06-23 ENCOUNTER — Telehealth
Admit: 2020-06-23 | Discharge: 2020-06-23 | Payer: BLUE CROSS/BLUE SHIELD | Attending: Family Medicine | Primary: Family Medicine

## 2020-06-23 DIAGNOSIS — R059 Cough, unspecified: Secondary | ICD-10-CM

## 2020-06-23 MED ORDER — BENZONATATE 200 MG CAP
200 mg | ORAL_CAPSULE | Freq: Three times a day (TID) | ORAL | 0 refills | Status: AC | PRN
Start: 2020-06-23 — End: 2020-06-30

## 2020-06-23 MED ORDER — AZITHROMYCIN 250 MG TAB
250 mg | ORAL_TABLET | ORAL | 0 refills | Status: AC
Start: 2020-06-23 — End: 2020-06-28

## 2020-06-23 NOTE — Progress Notes (Signed)
SUBJECTIVE:   Jill Kaufman is a 55 y.o. female who has a past medical history significant for hypothyroidism, high cholesterol, high triglycerides and breast cancer.  Presents today for virtual visit via telephone encounter.  Patient states that she began to feel poorly right after Christmas with cough, congestion, fever and body aches.  Last week she was tested on Wednesday for COVID and was positive.  Fever and body aches have resolved.  However she is now developing a worsening cough that is beginning to become productive with chest congestion.  She reports no chest pain or shortness of breath.  She is COVID vaccinated.    Patient's vital signs were not performed as encounter was performed virtually.  Location of virtual visit was patient's home.      HPI  See above    Past Medical History, Past Surgical History, Family history, Social History, and Medications were all reviewed with the patient today and updated as necessary.       Current Outpatient Medications   Medication Sig Dispense Refill   ??? tamoxifen (NOLVADEX) 20 mg tablet Take 1 Tablet by mouth daily. Indications: hormone receptor positive breast cancer 90 Tablet 3   ??? cholecalciferol, vitamin D3, (VITAMIN D3 PO) Take  by mouth.     ??? lovastatin (MEVACOR) 10 mg tablet Take 1 Tab by mouth daily. 90 Tab 3   ??? levothyroxine (SYNTHROID) 75 mcg tablet Take 1 Tab by mouth Daily (before breakfast). 90 Tab 3   ??? Soolantra 1 % crea        Allergies   Allergen Reactions   ??? Codeine Vertigo     Patient Active Problem List   Diagnosis Code   ??? Malignant neoplasm of central portion of right breast in female, estrogen receptor positive ([place]) C50.111, Z17.0   ??? S/P lumpectomy of breast Z98.890   ??? Abnormal breast finding N64.59     Past Medical History:   Diagnosis Date   ??? Breast cancer ([place])     T1cN0 right breast cancer   ??? GERD (gastroesophageal reflux disease)     OTC meds as needed    ??? High cholesterol    ??? Nausea & vomiting    ??? Thyroid disease 06/14/2011     Hypothyroidism      Past Surgical History:   Procedure Laterality Date   ??? HX BREAST BIOPSY Right 05/04/2018    RIGHT BREAST NEEDLE LOCALIZED BIOPSY performed by Lorenda Cahill, MD at [place]   ??? HX BREAST LUMPECTOMY Right 05/04/2018    RIGHT BREAST LUMPECTOMY performed by Lorenda Cahill, MD at [place]   ??? HX GYN      hysterectomy partial-still has ovaries     Family History   Problem Relation Age of Onset   ??? Heart Attack Mother    ??? Cancer Father         throat cancer, skin cancer    ??? Diabetes Father    ??? Heart Attack Father      Social History     Tobacco Use   ??? Smoking status: Never Smoker   ??? Smokeless tobacco: Never Used   Substance Use Topics   ??? Alcohol use: Yes     Comment: occassional         Review of Systems  See above    OBJECTIVE:  There were no vitals taken for this visit.     Physical Exam    Medical problems and test results  were reviewed with the patient today.         ASSESSMENT and PLAN    1.  Cough.  Tessalon Perles 200 mg 3 times a day.  Start over-the-counter Mucinex and Nasacort.  Discussed potential benefit of prone therapy.  Continue to isolate/quarantine according to CDC guidelines.  Monitor for worsening symptoms which are described with the patient with instructions on what to do in the event they occur.    2.  Chest congestion.  As above.    3.  Bronchitis.  As above.  Z-Pak as directed.    4.  Positive COVID.  As above.      Consent:  This patient and/or their healthcare decision maker is aware that this patient-initiated Telehealth encounter is a billable service, with coverage as determined by their insurance carrier. Patient is aware that they may receive a bill and has provided verbal consent to proceed: Yes    I was in the office while conducting this encounter.      This virtual visit was conducted telephone encounter only. -  I affirm this is a Patient Initiated Episode with an Established Patient who has not had a related appointment within my department in  the past 7 days or scheduled within the next 24 hours.  Note: this encounter is not billable if this call serves to triage the patient into an appointment for the relevant concern.      Total Time: minutes: 21-30 minutes.    Elements of this note have been dictated using speech recognition software. As a result, errors of speech recognition may have occurred.

## 2020-07-16 LAB — COLOGUARD: COLOGUARD: NEGATIVE

## 2020-07-28 MED FILL — TAMOXIFEN CITRATE 20MG TABS: 20 mg | 90 days supply | Qty: 90 | Fill #3 | Status: AC

## 2020-08-12 ENCOUNTER — Other Ambulatory Visit: Admit: 2020-08-12 | Discharge: 2020-08-12 | Payer: BLUE CROSS/BLUE SHIELD | Primary: Family Medicine

## 2020-08-12 DIAGNOSIS — Z0000 Encounter for general adult medical examination without abnormal findings: Secondary | ICD-10-CM

## 2020-08-12 LAB — AMB POC COMPLETE CBC,AUTOMATED ENTER
ABS. GRANS (POC): 7.4 10*3/uL (ref 2.0–7.8)
ABS. LYMPHS (POC): 1.9 10*3/uL (ref 0.6–4.1)
GRANULOCYTES (POC): 75.8 % (ref 37.0–92.0)
Granulocytes %, POC: 75.8 % (ref 37.0–92.0)
Granulocytes Abs: 7.4 10*3/uL (ref 2.0–7.8)
HCT (POC): 39.9 % (ref 37.0–51.0)
HGB (POC): 13.5 g/dL (ref 12.0–18.0)
Hematocrit, POC: 39.9 % (ref 37.0–51.0)
Hemoglobin, POC: 13.5 g/dL (ref 12.0–18.0)
LYMPHOCYTES (POC): 19.2 % (ref 10.0–58.5)
Lymphocyte %: 19.2 % (ref 10.0–58.5)
Lymphs Abs: 1.9 10*3/uL (ref 0.6–4.1)
MCH (POC): 31.1 pg (ref 26.0–32.0)
MCH: 31.1 pg (ref 26.0–32.0)
MCHC (POC): 33.8 g/dL (ref 31.0–36.0)
MCHC: 33.8 g/dL (ref 31.0–36.0)
MCV (POC): 92 fL (ref 80.0–97.0)
MCV: 92 fL (ref 80.0–97.0)
MID% POC: 5 % (ref 0.1–24.0)
MPV (POC): 8.6 fL (ref 0.0–49.9)
MPV POC: 8.6 fL (ref 0.0–49.9)
Mid # (POC): 0.5 10*3/uL (ref 0.0–1.8)
Mid Cells %, POC: 5 % (ref 0.1–24.0)
Mid Cells Absoulute POC: 0.5 10*3/uL (ref 0.0–1.8)
PLATELET (POC): 251 10*3/uL (ref 140–440)
Platelet Count, POC: 251 10*3/uL (ref 140–440)
RBC (POC): 4.34 10*6/uL (ref 4.20–6.30)
RBC, POC: 4.34 10*6/uL (ref 4.20–6.30)
RDW (POC): 13.3 % (ref 11.5–14.5)
RDW, POC: 13.3 % (ref 11.5–14.5)
WBC (POC): 9.8 10*3/uL (ref 4.1–10.9)
WBC, POC: 9.8 10*3/uL (ref 4.1–10.9)

## 2020-08-12 NOTE — Progress Notes (Signed)
Jill Kaufman is a 55 y.o. female had labs drawn today for labs per provider at [address] [place] [address].  The patient was drawn at  920am. In the   RIGHT arm and the patient tolerated the procedure fine with no issues.       Fredricka Bonine

## 2020-08-13 LAB — METABOLIC PANEL, COMPREHENSIVE
A-G Ratio: 1.8 (ref 1.2–2.2)
ALT (SGPT): 30 IU/L (ref 0–32)
AST (SGOT): 23 IU/L (ref 0–40)
Albumin: 4.3 g/dL (ref 3.8–4.9)
Alk. phosphatase: 67 IU/L (ref 44–121)
BUN/Creatinine ratio: 15 (ref 9–23)
BUN: 14 mg/dL (ref 6–24)
Bilirubin, total: 0.5 mg/dL (ref 0.0–1.2)
CO2: 19 mmol/L — ABNORMAL LOW (ref 20–29)
Calcium: 9.1 mg/dL (ref 8.7–10.2)
Chloride: 101 mmol/L (ref 96–106)
Creatinine: 0.93 mg/dL (ref 0.57–1.00)
GLOBULIN, TOTAL: 2.4 g/dL (ref 1.5–4.5)
Glucose: 90 mg/dL (ref 65–99)
Potassium: 4.4 mmol/L (ref 3.5–5.2)
Protein, total: 6.7 g/dL (ref 6.0–8.5)
Sodium: 141 mmol/L (ref 134–144)
eGFR: 73 mL/min/{1.73_m2} (ref >59)

## 2020-08-13 LAB — TSH 3RD GENERATION
TSH: 1.87 u[IU]/mL (ref 0.450–4.500)
TSH: 1.87 u[IU]/mL (ref 0.450–4.500)

## 2020-08-13 LAB — LIPID PANEL
Cholesterol, Total: 210 mg/dL — ABNORMAL HIGH (ref 100–199)
Cholesterol, total: 210 mg/dL — ABNORMAL HIGH (ref 100–199)
HDL Cholesterol: 46 mg/dL (ref >39)
HDL: 46 mg/dL (ref >39)
LDL Calculated: 135 mg/dL — ABNORMAL HIGH (ref 0–99)
LDL, calculated: 135 mg/dL — ABNORMAL HIGH (ref 0–99)
Triglyceride: 164 mg/dL — ABNORMAL HIGH (ref 0–149)
Triglycerides: 164 mg/dL — ABNORMAL HIGH (ref 0–149)
VLDL, calculated: 29 mg/dL (ref 5–40)
VLDL: 29 mg/dL (ref 5–40)

## 2020-08-13 LAB — COMPREHENSIVE METABOLIC PANEL
ALT: 30 IU/L (ref 0–32)
AST: 23 IU/L (ref 0–40)
Albumin/Globulin Ratio: 1.8 NA (ref 1.2–2.2)
Albumin: 4.3 g/dL (ref 3.8–4.9)
Alkaline Phosphatase: 67 IU/L (ref 44–121)
BUN: 14 mg/dL (ref 6–24)
Bun/Cre Ratio: 15 NA (ref 9–23)
CO2: 19 mmol/L — ABNORMAL LOW (ref 20–29)
Calcium: 9.1 mg/dL (ref 8.7–10.2)
Chloride: 101 mmol/L (ref 96–106)
Creatinine: 0.93 mg/dL (ref 0.57–1.00)
Est, Glomerular Filtration Rate: 73 mL/min/{1.73_m2} (ref >59)
Globulin, Total: 2.4 g/dL (ref 1.5–4.5)
Glucose: 90 mg/dL (ref 65–99)
Potassium: 4.4 mmol/L (ref 3.5–5.2)
Sodium: 141 mmol/L (ref 134–144)
Total Bilirubin: 0.5 mg/dL (ref 0.0–1.2)
Total Protein: 6.7 g/dL (ref 6.0–8.5)

## 2020-08-14 ENCOUNTER — Ambulatory Visit: Primary: Family Medicine

## 2020-08-14 ENCOUNTER — Ambulatory Visit: Admit: 2020-08-14 | Payer: BLUE CROSS/BLUE SHIELD | Primary: Family Medicine

## 2020-08-14 DIAGNOSIS — J0100 Acute maxillary sinusitis, unspecified: Secondary | ICD-10-CM

## 2020-08-14 MED ORDER — AMOXICILLIN 500 MG CAP
500 mg | ORAL_CAPSULE | Freq: Two times a day (BID) | ORAL | 0 refills | Status: AC
Start: 2020-08-14 — End: 2020-08-21

## 2020-08-14 NOTE — Progress Notes (Signed)
HISTORY OF PRESENT ILLNESS  Jill Kaufman is a 55 y.o. female.  Sinus Infection   The history is provided by the patient. This is a new problem. Episode onset: 3 weeks. The problem has been gradually worsening. There has been no fever. The pain is moderate. The pain has been constant since onset. Associated symptoms include congestion, ear pain, rhinorrhea and headaches. Pertinent negatives include no chills, no sore throat, no swollen glands, no cough, no shortness of breath and no chest pain. Associated symptoms comments: Sinus headaches, sinus pain that is worse on the right side, teeth hurting. Treatments tried: Saline nasal spray, Zyrtec, Nasocort, Nyquil, Mucinex. The treatment provided mild relief.       Review of Systems   Constitutional: Negative for chills and fever.   HENT: Positive for congestion, ear pain, rhinorrhea and sinus pain. Negative for sore throat.         Right ear hurting   Respiratory: Negative for cough, shortness of breath and stridor.    Cardiovascular: Negative for chest pain.   Musculoskeletal: Negative for myalgias.   Skin: Negative for rash.   Neurological: Positive for headaches. Negative for dizziness.     Allergies   Allergen Reactions   ??? Codeine Vertigo     Current Outpatient Medications on File Prior to Visit   Medication Sig Dispense Refill   ??? tamoxifen (NOLVADEX) 20 mg tablet Take 1 Tablet by mouth daily. Indications: hormone receptor positive breast cancer 90 Tablet 3   ??? cholecalciferol, vitamin D3, (VITAMIN D3 PO) Take  by mouth.     ??? lovastatin (MEVACOR) 10 mg tablet Take 1 Tab by mouth daily. 90 Tab 3   ??? levothyroxine (SYNTHROID) 75 mcg tablet Take 1 Tab by mouth Daily (before breakfast). 90 Tab 3   ??? Soolantra 1 % crea        No current facility-administered medications on file prior to visit.       Physical Exam  Vitals reviewed.   Constitutional:       General: She is not in acute distress.     Appearance: Normal appearance. She is not ill-appearing.   HENT:       Head: Normocephalic.      Right Ear: Hearing, ear canal and external ear normal. A middle ear effusion is present. Tympanic membrane is bulging. Tympanic membrane is not injected or erythematous.      Left Ear: Hearing, tympanic membrane, ear canal and external ear normal.      Nose: Congestion present.      Right Turbinates: Swollen.      Right Sinus: Maxillary sinus tenderness present.      Left Sinus: Maxillary sinus tenderness present.      Mouth/Throat:      Lips: Pink.      Mouth: Mucous membranes are moist.      Pharynx: Oropharynx is clear. Uvula midline. No pharyngeal swelling, oropharyngeal exudate or posterior oropharyngeal erythema.   Cardiovascular:      Rate and Rhythm: Normal rate and regular rhythm.      Heart sounds: Normal heart sounds.   Pulmonary:      Effort: Pulmonary effort is normal. No respiratory distress.      Breath sounds: Normal breath sounds. No stridor. No wheezing or rhonchi.   Skin:     General: Skin is warm and dry.   Neurological:      Mental Status: She is alert and oriented to person, place, and time.   Psychiatric:  Mood and Affect: Mood normal.         Behavior: Behavior normal.         Thought Content: Thought content normal.         Judgment: Judgment normal.       Visit Vitals  BP (!) 144/90   Pulse 82   Temp 98 ??F (36.7 ??C)   Resp 16   SpO2 100%       ASSESSMENT and PLAN  A:     ICD-10-CM ICD-9-CM    1. Acute maxillary sinusitis, recurrence not specified  J01.00 461.0 amoxicillin (AMOXIL) 500 mg capsule   2. Right acute serous otitis media, recurrence not specified  H65.01 381.01        P:    * Rxs provided: Amoxicillin 500 mg one po BID x 7 days, #14, NR  Recommendations: May use cool-mist humidifier/Vaporizer at bedside/living quarters. Recommended saline rinses with OceanMist spray or using NettiPot. Encouraged increase fluid intake for adequate hydration. Reviewed good hand hygiene.  Tylenol/Ibuprofen for aches/pains.  Continue Zyrtec and Nasacort daily. Do not  take Nyquil may elevate blood pressure. Discussed low salt diet and keep f/u appointment with PCP and recheck blood pressure. Risk of HTN discussed    Risks and benefits of medications reviewed with patient .    Follow up: 5 days if not any better or worse  Seek ER care for chest pain or SOB.    Patient voices understanding and agrees with the plan of care as described above.  ???   ??? Labs performed/ordered:  ??? Educational information:  Neurosurgeon reviewed and provided at checkout re:_____  ??? Follow up: Patient was encouraged to return to the clinic and/or PCP if s/s persist or do not resolve completely. Also advised to seek emergent care if worsening signs and symptoms warrant immediate evaluation including, but not limited to HA, blurred vision, speech disturbance, difficulty with ambulation/gait, numbness, tingling, weakness, syncope, abdominal pain, chest pain, or shortness of breath.    *Side effects, adverse effects, risks versus benefits associated with medications prescribed/recommended were discussed with the patient. Patient verbalized understanding. All questions answered.      * I have reviewed the patient's medication list, past medical, family, social, and surgical    history and updated the patient record appropriately      Social History     Socioeconomic History   ??? Marital status: MARRIED     Spouse name: Not on file   ??? Number of children: Not on file   ??? Years of education: Not on file   ??? Highest education level: Not on file   Occupational History   ??? Not on file   Tobacco Use   ??? Smoking status: Never Smoker   ??? Smokeless tobacco: Never Used   Substance and Sexual Activity   ??? Alcohol use: Yes     Comment: occassional   ??? Drug use: Not Currently   ??? Sexual activity: Not on file   Other Topics Concern   ??? Military Service Not Asked   ??? Blood Transfusions Not Asked   ??? Caffeine Concern Not Asked   ??? Occupational Exposure Not Asked   ??? Hobby Hazards Not Asked   ??? Sleep Concern Not Asked    ??? Stress Concern Not Asked   ??? Weight Concern Not Asked   ??? Special Diet Not Asked   ??? Back Care Not Asked   ??? Exercise Not Asked   ??? Bike Helmet Not  Asked   ??? Seat Belt Not Asked   ??? Self-Exams Not Asked   Social History Narrative   ??? Not on file     Social Determinants of Health     Financial Resource Strain:    ??? Difficulty of Paying Living Expenses: Not on file   Food Insecurity:    ??? Worried About Running Out of Food in the Last Year: Not on file   ??? Ran Out of Food in the Last Year: Not on file   Transportation Needs:    ??? Lack of Transportation (Medical): Not on file   ??? Lack of Transportation (Non-Medical): Not on file   Physical Activity:    ??? Days of Exercise per Week: Not on file   ??? Minutes of Exercise per Session: Not on file   Stress:    ??? Feeling of Stress : Not on file   Social Connections:    ??? Frequency of Communication with Friends and Family: Not on file   ??? Frequency of Social Gatherings with Friends and Family: Not on file   ??? Attends Religious Services: Not on file   ??? Active Member of Clubs or Organizations: Not on file   ??? Attends Archivist Meetings: Not on file   ??? Marital Status: Not on file   Intimate Partner Violence:    ??? Fear of Current or Ex-Partner: Not on file   ??? Emotionally Abused: Not on file   ??? Physically Abused: Not on file   ??? Sexually Abused: Not on file   Housing Stability:    ??? Unable to Pay for Housing in the Last Year: Not on file   ??? Number of Places Lived in the Last Year: Not on file   ??? Unstable Housing in the Last Year: Not on file     Past Medical History:   Diagnosis Date   ??? Breast cancer ([place])     T1cN0 right breast cancer   ??? GERD (gastroesophageal reflux disease)     OTC meds as needed    ??? High cholesterol    ??? Nausea & vomiting    ??? Thyroid disease 06/14/2011    Hypothyroidism      Past Surgical History:   Procedure Laterality Date   ??? HX BREAST BIOPSY Right 05/04/2018    RIGHT BREAST NEEDLE LOCALIZED BIOPSY performed by Lorenda Cahill, MD  at [place]   ??? HX BREAST LUMPECTOMY Right 05/04/2018    RIGHT BREAST LUMPECTOMY performed by Lorenda Cahill, MD at [place]   ??? HX GYN      hysterectomy partial-still has ovaries     Family History   Problem Relation Age of Onset   ??? Heart Attack Mother    ??? Cancer Father         throat cancer, skin cancer    ??? Diabetes Father    ??? Heart Attack Father

## 2020-08-19 ENCOUNTER — Ambulatory Visit: Attending: Family Medicine | Primary: Family Medicine

## 2020-08-19 ENCOUNTER — Ambulatory Visit
Admit: 2020-08-19 | Discharge: 2020-08-19 | Payer: BLUE CROSS/BLUE SHIELD | Attending: Family Medicine | Primary: Family Medicine

## 2020-08-19 DIAGNOSIS — Z0000 Encounter for general adult medical examination without abnormal findings: Secondary | ICD-10-CM

## 2020-08-19 MED ORDER — LEVOTHYROXINE 75 MCG TAB
75 mcg | ORAL_TABLET | Freq: Every day | ORAL | 3 refills | Status: AC
Start: 2020-08-19 — End: ?

## 2020-08-19 MED ORDER — METHYLPREDNISOLONE 4 MG TABS IN A DOSE PACK
4 mg | ORAL | 0 refills | Status: DC
Start: 2020-08-19 — End: 2020-09-18

## 2020-08-19 NOTE — Progress Notes (Signed)
SUBJECTIVE:   Jill Kaufman is a 55 y.o. female who has a past medical history significant for hypothyroidism, high cholesterol, high triglycerides and breast cancer.  Current medications are listed in the EMR and reviewed today.  Review of systems reveals that the patient has had bilateral ringing in her ears following her second COVID vaccine.  Patient reports no hearing loss or history of noise trauma.  The ringing in the ears is constant.    In addition the patient had COVID in January.  Ever since she has had congestion in her head and chest with a drainage and increasing mucus production.    In addition she is also been noticing since she had COVID intermittent palpitations.  No prior history of palpitations.  She reports no chest pain, shortness of breath, orthopnea or PND.  She reports no excessive caffeine use.    HPI  See above    Past Medical History, Past Surgical History, Family history, Social History, and Medications were all reviewed with the patient today and updated as necessary.       Current Outpatient Medications   Medication Sig Dispense Refill   ??? amoxicillin (AMOXIL) 500 mg capsule Take 1 Capsule by mouth two (2) times a day for 7 days. Indications: acute bacterial infection of the sinuses 14 Capsule 0   ??? tamoxifen (NOLVADEX) 20 mg tablet Take 1 Tablet by mouth daily. Indications: hormone receptor positive breast cancer 90 Tablet 3   ??? cholecalciferol, vitamin D3, (VITAMIN D3 PO) Take  by mouth.     ??? lovastatin (MEVACOR) 10 mg tablet Take 1 Tab by mouth daily. 90 Tab 3   ??? Soolantra 1 % crea      ??? levothyroxine (SYNTHROID) 75 mcg tablet Take 1 Tablet by mouth Daily (before breakfast). 90 Tablet 3     Allergies   Allergen Reactions   ??? Codeine Vertigo     Patient Active Problem List   Diagnosis Code   ??? Malignant neoplasm of central portion of right breast in female, estrogen receptor positive (HCC) C50.111, Z17.0   ??? S/P lumpectomy of breast Z98.890   ??? Abnormal breast finding N64.59      Past Medical History:   Diagnosis Date   ??? Breast cancer (HCC)     T1cN0 right breast cancer   ??? GERD (gastroesophageal reflux disease)     OTC meds as needed    ??? High cholesterol    ??? Nausea & vomiting    ??? Thyroid disease 06/14/2011    Hypothyroidism      Past Surgical History:   Procedure Laterality Date   ??? HX BREAST BIOPSY Right 05/04/2018    RIGHT BREAST NEEDLE LOCALIZED BIOPSY performed by Iva Lento, MD at [facility] MAIN OR   ??? HX BREAST LUMPECTOMY Right 05/04/2018    RIGHT BREAST LUMPECTOMY performed by Iva Lento, MD at [facility] MAIN OR   ??? HX GYN      hysterectomy partial-still has ovaries     Family History   Problem Relation Age of Onset   ??? Heart Attack Mother    ??? Cancer Father         throat cancer, skin cancer    ??? Diabetes Father    ??? Heart Attack Father      Social History     Tobacco Use   ??? Smoking status: Never Smoker   ??? Smokeless tobacco: Never Used   Substance Use Topics   ??? Alcohol use: Yes  Comment: occassional         Review of Systems  See above    OBJECTIVE:  Visit Vitals  BP 126/84   Wt 152 lb 6.4 oz (69.1 kg)   BMI 27.00 kg/m??        Physical Exam  Constitutional:       Appearance: She is well-developed.   HENT:      Head: Normocephalic and atraumatic.      Right Ear: External ear normal.      Left Ear: External ear normal.      Ears:      Comments: TMs are dull bilaterally with clear effusions.     Nose: Nose normal.      Mouth/Throat:      Pharynx: No oropharyngeal exudate.   Eyes:      General: No scleral icterus.        Right eye: No discharge.         Left eye: No discharge.      Pupils: Pupils are equal, round, and reactive to light.   Neck:      Thyroid: No thyromegaly.      Vascular: No JVD.      Trachea: No tracheal deviation.   Cardiovascular:      Rate and Rhythm: Normal rate and regular rhythm.      Heart sounds: Normal heart sounds. No murmur heard.  No friction rub. No gallop.    Pulmonary:      Effort: Pulmonary effort is normal. No respiratory distress.       Breath sounds: Normal breath sounds. No wheezing or rales.   Chest:      Chest wall: No tenderness.   Abdominal:      General: Bowel sounds are normal. There is no distension.      Palpations: Abdomen is soft. There is no mass.      Tenderness: There is no abdominal tenderness. There is no guarding or rebound.   Musculoskeletal:         General: No tenderness. Normal range of motion.      Cervical back: Normal range of motion and neck supple.   Lymphadenopathy:      Cervical: No cervical adenopathy.   Skin:     General: Skin is warm and dry.      Findings: No erythema or rash.   Neurological:      Mental Status: She is alert and oriented to person, place, and time.      Cranial Nerves: No cranial nerve deficit.      Motor: No abnormal muscle tone.      Coordination: Coordination normal.      Deep Tendon Reflexes: Reflexes are normal and symmetric. Reflexes normal.   Psychiatric:         Behavior: Behavior normal.         Thought Content: Thought content normal.         Judgment: Judgment normal.         Medical problems and test results were reviewed with the patient today.         ASSESSMENT and PLAN    1.  CPX.  Anticipatory guidance discussed including the importance of sunscreen use, helmet use and seatbelt use.  Blood pressure 126/84.  Refer for screening colonoscopy.  Mammography is up-to-date.    2.  Hypothyroidism.  TSH 1.8.  Continue current dose of Synthroid.    3.  High cholesterol.  LDL is 135.  Continue Mevacor.  Intolerant to other statins.    4.  High triglycerides.  Triglycerides are 164.  Dietary focus.    5.  History of breast cancer.  Surveillance is up-to-date.    6.  Bilateral tinnitus.  Refer to ENT.    7.  Allergic rhinitis/eustachian tube dysfunction.  Plain Mucinex and Nasacort.  Medrol Dosepak as directed.  If symptoms persist ENT will evaluate.    8.  Head congestion.  As above.    9.  Palpitations.  TSH, CBC and metabolic panel unremarkable.  Post-COVID symptoms.  Referred to cardiology  at patient's request for further evaluation.    Elements of this note have been dictated using speech recognition software. As a result, errors of speech recognition may have occurred.

## 2020-09-07 ENCOUNTER — Ambulatory Visit: Attending: Medical | Primary: Family Medicine

## 2020-09-07 ENCOUNTER — Ambulatory Visit
Admit: 2020-09-07 | Discharge: 2020-09-07 | Payer: BLUE CROSS/BLUE SHIELD | Attending: Medical | Primary: Family Medicine

## 2020-09-07 DIAGNOSIS — H9313 Tinnitus, bilateral: Secondary | ICD-10-CM

## 2020-09-07 NOTE — Progress Notes (Signed)
HPI:  Jill Kaufman is a 55 y.o. female seen as a new patient for Ringing in Ear (Patient here for ringing in the L ear.Patient states that this started after covid.).   Patient presents to the clinic for evaluation of bilateral tinnitus (L>R). She reports that she noticed the ringing after her COVID vaccine about one year ago. She describes the sound as high pitched and constant. Patient reports that the ringing has become worse over the past few months since she was diagnosed with COVID. Patient reports that she had nasal congestion and runny nose with COVID. She states that she was treated with antibiotics and she felt like her symptoms returned shortly after the antibiotics. She states that she was placed on steroids and her PCP thought that the steroids would resolve the ringing but it didn't help. Patient reports that the steroids helped with her nasal symptoms.   Patient denies any medication adjustments or new medications when the ringing started. She states that she had fluid in her ears when her PCP checked her ears. She denies any prior ear surgery. She denies any significant noise exposure.    Past Medical History, Past Surgical History, Family history, Social History, and Medications were all reviewed with the patient today and updated as necessary.     Allergies   Allergen Reactions   ??? Codeine Vertigo     Patient Active Problem List   Diagnosis Code   ??? Malignant neoplasm of central portion of right breast in female, estrogen receptor positive ([place]) C50.111, Z17.0   ??? S/P lumpectomy of breast Z98.890   ??? Abnormal breast finding N64.59     Current Outpatient Medications   Medication Sig   ??? levothyroxine (SYNTHROID) 75 mcg tablet Take 1 Tablet by mouth Daily (before breakfast).   ??? methylPREDNISolone (MEDROL DOSEPACK) 4 mg tablet Take as directed   ??? tamoxifen (NOLVADEX) 20 mg tablet Take 1 Tablet by mouth daily. Indications: hormone receptor positive breast cancer   ??? cholecalciferol, vitamin D3,  (VITAMIN D3 PO) Take  by mouth.   ??? lovastatin (MEVACOR) 10 mg tablet Take 1 Tab by mouth daily.   ??? Soolantra 1 % crea      No current facility-administered medications for this visit.     Past Medical History:   Diagnosis Date   ??? Breast cancer ([place])     T1cN0 right breast cancer   ??? GERD (gastroesophageal reflux disease)     OTC meds as needed    ??? High cholesterol    ??? Nausea & vomiting    ??? Thyroid disease 06/14/2011    Hypothyroidism      Social History     Tobacco Use   ??? Smoking status: Never Smoker   ??? Smokeless tobacco: Never Used   Substance Use Topics   ??? Alcohol use: Yes     Comment: occassional     Past Surgical History:   Procedure Laterality Date   ??? HX BREAST BIOPSY Right 05/04/2018    RIGHT BREAST NEEDLE LOCALIZED BIOPSY performed by Lorenda Cahill, MD at [place]   ??? HX BREAST LUMPECTOMY Right 05/04/2018    RIGHT BREAST LUMPECTOMY performed by Lorenda Cahill, MD at [place]   ??? HX GYN      hysterectomy partial-still has ovaries     Family History   Problem Relation Age of Onset   ??? Heart Attack Mother    ??? Cancer Father  throat cancer, skin cancer    ??? Diabetes Father    ??? Heart Attack Father         ROS:    Review of Systems   Constitutional: Negative for chills and fever.   HENT: Positive for tinnitus.    Eyes: Negative for blurred vision.   Respiratory: Negative for cough.    Cardiovascular: Negative for chest pain.   Gastrointestinal: Negative for nausea.   Genitourinary: Negative for urgency.   Musculoskeletal: Negative for joint pain.   Skin: Negative for rash.   Neurological: Negative for dizziness and headaches.   Endo/Heme/Allergies: Negative for environmental allergies.          PHYSICAL EXAM:    Visit Vitals  BP (!) 140/80 (BP 1 Location: Left upper arm, BP Patient Position: Sitting)   Ht 5\' 4"  (1.626 m)   Wt 150 lb 3.2 oz (68.1 kg)   BMI 25.78 kg/m??       Head  Head and Face - The head and face are atraumatic, normocephalic.  The salivary glands are intact and the  facial appearance is symmetric.    Head shape - No scars, lesions, or masses    Ear  Ear - Tympanic membranes are clear, the external auditory canal is without discharge and the tympanic membranes are mobile.  There is no tympanic membrane erythema and no middle ear opacity is visualized.    Pinna: bilateral - No hematomas or lacerations    Eye  Eyeball - bilateral - extraocular motions intact, equal in size and movement    Nose and Sinuses  Nose - mucosa is pink and the septum is slightly deviated to the right.  There are no nasal lesions and there was mild turbinate hypertrophy.    Mouth and Throat  Lips - upper lip - normal: no dryness, cracking, pallor, cyanosis, or vesicular eruption.  Lower lip: normal: no dryness, cracking, pallor, cyanosis, or vesicular eruption.     Teeth and Gums - No bleeding, no inflammation or ulceration.    Lips - Pink and symmetrical  Oral Cavity - Oral mucosa pink, soft and hard palates contiguous and tongue moist without ulcers.  The mucosa is without ulcerations. No oral cavity masses present.   Parotid Gland - Bilateral - Non tender, not swollen.  Oropharynx - No discharge or Erythema  Nasopharynx - Non obstructed, mucosa pink and moist.    Hypopharynx - No erythema  Submandibular Gland - Non tender, not swollen.    Tonsils - Normal    Neck   Neck - Full range of motion and Supple.  Non Tender.   No Masses.    Trachea - Midline.  Thyroid - Gland - Symmetric.  Non Tender.  Nodules - No nodules.    Neurologic - II - XII Grossly intact bilaterally    Cardiac  Inspection - Jugular Vein:  Bilateral - non distended, no prominent pulsations    Chest and Lung  Inspection - Movements:  Chest symmetrical with bilateral expansion, respirations even and non labored      ASSESSMENT and PLAN      ICD-10-CM ICD-9-CM    1. Tinnitus of both ears  H93.13 388.30 COMPREHENSIVE HEARING TEST      TYMPANOMETRY       Audiogram shows normal hearing for both ears. Speech discrimination is 100% bilaterally.  Tympanograms are type A bilaterally. Patient reassured about hearing test and exam. We discussed that since the ringing is in both ears, we will monitor  and no imaging is needed at this time. Patient will follow up in one year for repeat audiogram to monitor hearing and return sooner if she notes any changes in her hearing. We discussed masking techniques she could try and lipoflavonoids.     Thank you for the opportunity to participate in the care of this patient. Please let me know if you have any further questions or concerns.    Neill Loft, PA-C  09/07/2020

## 2020-09-07 NOTE — Progress Notes (Signed)
AUDIOLOGY EVALUATION    Rolan Lipa had Tympanometry and Audiometry performed today.    The patient reports tinnitus.     Results as follows:    Tympanometry    Type A -  bilaterally    Audiometry    Test Performed - Comprehensive Audiogram    Type of Loss - Right Ear: normal hearing                          Left Ear: normal hearing    SRT   Measurement Right Ear Left Ear   Value 10 5   Unit dB dB     Discrimination  Measurement Right Ear Left Ear   Value 100% 100%   Unit dB dB     Recommend  Retest as clinically indicated    A. LaBelle, Artist

## 2020-09-18 ENCOUNTER — Ambulatory Visit: Attending: Cardiovascular Disease | Primary: Family Medicine

## 2020-09-18 ENCOUNTER — Ambulatory Visit: Admit: 2020-09-18 | Payer: BLUE CROSS/BLUE SHIELD | Attending: Cardiovascular Disease | Primary: Family Medicine

## 2020-09-18 DIAGNOSIS — R002 Palpitations: Secondary | ICD-10-CM

## 2020-09-18 NOTE — Progress Notes (Signed)
Progress Notes by Michail Sermon, MD at 09/18/20 0900                Author: Michail Sermon, MD  Service: --  Author Type: Physician       Filed: 09/18/20 0939  Encounter Date: 09/18/2020  Status: Signed          Editor: Michail Sermon, MD (Physician)                             [place]   [address], [address]   [place] [address]   PHONE: [phone]         09/18/20      NAME:  Jill Kaufman   DOB: 1966/05/08   MRN: [id number]             SUBJECTIVE:    Jill Kaufman is a 55 y.o.  female seen for a consultation visit regarding the following:         Chief Complaint       Patient presents with        ?  Referral / Consult        ?  Irregular Heart Beat                  HPI:   Consultation is requested by Elvis Coil, MD for evaluation of  Referral / Consult and Irregular Heart Beat    .      Consult palpitations.  COVID infection January 2022, has received vaccines.  Sometimes feels her heart fluttering  or beating fast.  Occurs less than weekly, maybe twice a month, lasting only seconds.  Exercises but not as often as she used to. When she does exercise she feels great.  Denies cp, dyspnea.  Consumes 1-2 caffienated sodas daily. No supplements.  Is on  tamoxifen for her breast cancer diagnosis.  Suffers with hot flashes, leg pain.  She was on livalo at one time though she tolerated and did well with but her insurance wouldn't cover it.          PAST CARDIAC HISTORY:   2019- breast cancer - lumpectomy, XRT, tamoxifen   Jan 2022- COVID infection               Past Medical History, Past Surgical History, Family history, Social History, and Medications were all reviewed with the patient today and updated as necessary.         Prior to Admission medications             Medication  Sig  Start Date  End Date  Taking?  Authorizing Provider            levothyroxine (SYNTHROID) 75 mcg tablet  Take 1 Tablet by mouth Daily (before breakfast).  08/19/20    Yes  Elvis Coil, MD     tamoxifen (NOLVADEX) 20 mg tablet  Take 1 Tablet by mouth daily. Indications: hormone receptor positive breast cancer  11/01/19    Yes  Greer Ee, MD     cholecalciferol, vitamin D3, (VITAMIN D3 PO)  Take  by mouth.      Yes  Provider, Historical     lovastatin (MEVACOR) 10 mg tablet  Take 1 Tab by mouth daily.  08/07/19    Yes  Elvis Coil, MD            Soolantra 1 % crea  04/23/19    Yes  Provider, Historical          Allergies        Allergen  Reactions         ?  Codeine  Vertigo          Past Medical History:        Diagnosis  Date         ?  Breast cancer ([place])            T1cN0 right breast cancer         ?  GERD (gastroesophageal reflux disease)            OTC meds as needed          ?  High cholesterol       ?  Nausea & vomiting       ?  Thyroid disease  06/14/2011          Hypothyroidism           Past Surgical History:         Procedure  Laterality  Date          ?  HX BREAST BIOPSY  Right  05/04/2018          RIGHT BREAST NEEDLE LOCALIZED BIOPSY performed by Lorenda Cahill, MD at [place]          ?  HX BREAST LUMPECTOMY  Right  05/04/2018          RIGHT BREAST LUMPECTOMY performed by Lorenda Cahill, MD at [place]          ?  HX GYN              hysterectomy partial-still has ovaries          Family History         Problem  Relation  Age of Onset          ?  Heart Attack  Mother                late 44's, currently in her 47's          ?  Heart Failure  Mother       ?  Cancer  Father                throat cancer, skin cancer           ?  Diabetes  Father       ?  Heart Attack  Father                98's, now late 74's          Social History          Tobacco Use         ?  Smoking status:  Never Smoker     ?  Smokeless tobacco:  Never Used       Substance Use Topics         ?  Alcohol use:  Yes             Comment: occasional           ROS:      Review of Systems    Cardiovascular: Positive for palpitations. Negative for chest pain.    Respiratory: Negative for  shortness of breath.               PHYSICAL EXAM:  Visit Vitals      BP  130/70     Pulse  65     Ht  5\' 4"  (1.626 m)     Wt  150 lb 14.4 oz (68.4 kg)        BMI  25.90 kg/m??            Physical Exam   Constitutional :        General: She is not in acute distress.     Appearance: She is well-developed.    HENT:       Head: Normocephalic and atraumatic.   Eyes :       General: No scleral icterus.     Conjunctiva/sclera: Conjunctivae normal.    Neck:       Vascular: No JVD.   Cardiovascular :       Rate and Rhythm: Normal rate and regular rhythm.      Heart sounds: No murmur heard.   No friction rub. No gallop.     Pulmonary:       Effort: No respiratory distress.      Breath sounds: Normal breath sounds. No wheezing or rales.    Abdominal:      General: Bowel sounds are normal. There is no distension.      Tenderness: There is no abdominal tenderness.     Musculoskeletal:          General: No deformity.      Cervical back: Neck supple.    Skin:      General: Skin is warm and dry.   Neurological :       Mental Status: She is alert.      Motor: No abnormal muscle tone.    Psychiatric:         Behavior: Behavior normal.             Medical problems and test results were reviewed with the patient today.       DATA REVIEW      LIPID PANEL      Recent Labs            08/12/20   1135  02/19/20   0906     CHOL  210*  193     HDL  46  48     LDLC  135*  115.6*         TRIGL  164*  147            CBC     Recent Labs               08/12/20   0920  05/13/20   1509  11/01/19   1357  07/31/19   0945  05/03/19   1407     WBC   --   7.3  6.8   --     < >     WBCPOC  9.8   --    --   7.5   --      HGB   --   12.7  13.3   --     < >     HGBPOC  13.5   --    --   14.3   --      HCT   --   36.8  38.1   --     < >     HCTPOC  39.9   --    --   43.8   --  PLT   --   234  269   --     < >     PLTPOC  251   --    --   320   --      MCV   --   88.7  86.6   --     < >     MCVPOC  92.0   --    --   94.5   --         < > = values in  this interval not displayed.        THYROID     Recent Labs            08/12/20   1135  02/05/20   0840         TSH  1.870  3.050             BMP      Recent Labs              08/12/20   1135  05/13/20   1509  02/19/20   0906  02/05/20   0840     NA  141  139   --   140     K  4.4  3.6   --   4.0     CL  101  107   --   103     CO2  19*  28   --   23     AGAP   --   4*   --    --      GLU  90  122*    < >  93     BUN  14  16   --   15     CREA  0.93  1.00   --   0.84     BUCR  15   --    --   18     GFRAA   --   >60   --   91     GFRNA   --   >60   --   79     CA  9.1  8.7   --   9.2        < > = values in this interval not displayed.            EKG      Sinus  Rhythm 65   normal axis, intervals   nstwf         CXR/IMAGING            DEVICE INTERROGATION            OUTSIDE RECORDS REVIEW      Records from outside providers have been reviewed and summarized as noted in the HPI, past history and data review sections of this note, and reviewed with patient. .          ASSESSMENT and PLAN      Diagnoses and all orders for this visit:      1. Palpitations   -     AMB POC EKG ROUTINE W/ 12 LEADS, INTER & REP      2. Family history of premature coronary artery disease   -     CT HEART W/O CONT WITH CALCIUM; Future               IMPRESSION:       Describes  benign isolated ectopy.  Offered ambulatory monitor but likely low yield with brevity and infrequency of symptoms.  Might do better  to use [facility]ia monitor which is reviewed      Offered coronary calcium score to refine risk and guide therapy. She is statin intolerant having developed myalgias on multiple, tolerating only lovastatin and pitavastatin.  If score elevated would try to get her on PCSK9 inhibitor. Results and recommendations  via My Chart           Follow-up and Dispositions      ??  Return if symptoms worsen or fail to improve.                        Thank you for allowing me to participate in this patient's care.  Please call or contact me if there are any  questions or concerns regarding the above.        Michail Sermon, MD   09/18/20   7:35 AM

## 2020-09-23 MED ORDER — LOVASTATIN 10 MG TAB
10 mg | ORAL_TABLET | Freq: Every day | ORAL | 3 refills | Status: AC
Start: 2020-09-23 — End: ?

## 2020-10-01 ENCOUNTER — Encounter

## 2020-10-08 ENCOUNTER — Inpatient Hospital Stay: Admit: 2020-10-08 | Payer: Self-pay | Attending: Cardiovascular Disease | Primary: Family Medicine

## 2020-10-08 DIAGNOSIS — K449 Diaphragmatic hernia without obstruction or gangrene: Secondary | ICD-10-CM

## 2020-10-08 NOTE — Progress Notes (Signed)
Great news!  Calcium score is 0, so would not worry about more expensive cholesterol therapies since your 10 year risk of heart attack is very, very low.  Do work on or continue regular exercise and healthy diet.  Please let me know if you have any additional concerns

## 2020-10-09 ENCOUNTER — Encounter

## 2020-10-09 LAB — BE WELL HEALTH SCREEN
Cholesterol, Total: 215 MG/DL — ABNORMAL HIGH (ref <200)
Cholesterol, total: 215 MG/DL — ABNORMAL HIGH (ref <200)
Glucose: 86 mg/dL (ref 65–100)
Glucose: 86 mg/dL (ref 65–100)
HDL Cholesterol: 42 MG/DL (ref 40–60)
HDL: 42 MG/DL (ref 40–60)
LDL Calculated: 127.6 MG/DL — ABNORMAL HIGH (ref <100)
LDL, calculated: 127.6 MG/DL — ABNORMAL HIGH (ref <100)
Triglyceride: 227 MG/DL — ABNORMAL HIGH (ref 35–150)
Triglycerides: 227 MG/DL — ABNORMAL HIGH (ref 35–150)

## 2020-10-12 MED ORDER — TAMOXIFEN 20 MG TAB
20 mg | ORAL_TABLET | Freq: Every day | ORAL | 3 refills | Status: AC
Start: 2020-10-12 — End: ?
  Filled 2020-10-12: qty 90, 90d supply, fill #0

## 2020-10-17 ENCOUNTER — Encounter

## 2020-11-10 ENCOUNTER — Inpatient Hospital Stay: Admit: 2020-11-10 | Payer: BLUE CROSS/BLUE SHIELD | Primary: Family Medicine

## 2020-11-10 ENCOUNTER — Ambulatory Visit: Payer: BLUE CROSS/BLUE SHIELD | Primary: Family Medicine

## 2020-11-10 DIAGNOSIS — Z853 Personal history of malignant neoplasm of breast: Secondary | ICD-10-CM

## 2020-11-10 MED ORDER — GADOTERIDOL 279.3 MG/ML IV SOLN
279.3 MG/ML | Freq: Once | INTRAVENOUS | Status: AC | PRN
Start: 2020-11-10 — End: 2020-11-10
  Administered 2020-11-10: 18:00:00 via INTRAVENOUS

## 2020-11-10 MED FILL — PROHANCE 279.3 MG/ML IV SOLN: 279.3 mg/mL | INTRAVENOUS | Qty: 15

## 2020-11-11 ENCOUNTER — Inpatient Hospital Stay: Admit: 2020-11-11 | Payer: BLUE CROSS/BLUE SHIELD | Primary: Family Medicine

## 2020-11-11 ENCOUNTER — Ambulatory Visit
Admit: 2020-11-11 | Discharge: 2020-11-12 | Payer: BLUE CROSS/BLUE SHIELD | Attending: Internal Medicine | Primary: Family Medicine

## 2020-11-11 ENCOUNTER — Encounter: Primary: Family Medicine

## 2020-11-11 ENCOUNTER — Encounter: Attending: Internal Medicine | Primary: Family Medicine

## 2020-11-11 DIAGNOSIS — Z170 Estrogen receptor positive status [ER+]: Secondary | ICD-10-CM

## 2020-11-11 DIAGNOSIS — C50111 Malignant neoplasm of central portion of right female breast: Secondary | ICD-10-CM

## 2020-11-11 LAB — COMPREHENSIVE METABOLIC PANEL
ALT: 30 U/L (ref 12–65)
AST: 17 U/L (ref 15–37)
Albumin/Globulin Ratio: 1.1 — ABNORMAL LOW (ref 1.2–3.5)
Albumin: 3.8 g/dL (ref 3.5–5.0)
Alk Phosphatase: 60 U/L (ref 50–136)
Anion Gap: 7 mmol/L (ref 7–16)
BUN: 17 MG/DL (ref 6–23)
CO2: 26 mmol/L (ref 21–32)
Calcium: 9 MG/DL (ref 8.3–10.4)
Chloride: 105 mmol/L (ref 98–107)
Creatinine: 1 MG/DL (ref 0.6–1.0)
GFR African American: >60 mL/min/{1.73_m2} (ref >60)
GFR Non-African American: >60 mL/min/{1.73_m2} (ref >60)
Globulin: 3.4 g/dL (ref 2.3–3.5)
Glucose: 109 mg/dL — ABNORMAL HIGH (ref 65–100)
Potassium: 3.9 mmol/L (ref 3.5–5.1)
Sodium: 138 mmol/L (ref 136–145)
Total Bilirubin: 0.4 MG/DL (ref 0.2–1.1)
Total Protein: 7.2 g/dL (ref 6.3–8.2)

## 2020-11-11 LAB — CBC WITH AUTO DIFFERENTIAL
Absolute Eos #: 0.1 10*3/uL (ref 0.0–0.8)
Absolute Immature Granulocyte: 0 10*3/uL (ref 0.0–0.5)
Absolute Lymph #: 1.6 10*3/uL (ref 0.5–4.6)
Absolute Mono #: 0.3 10*3/uL (ref 0.1–1.3)
Basophils Absolute: 0.1 10*3/uL (ref 0.0–0.2)
Basophils: 1 % (ref 0.0–2.0)
Eosinophils %: 2 % (ref 0.5–7.8)
Hematocrit: 38.5 % (ref 35.8–46.3)
Hemoglobin: 13.3 g/dL (ref 11.7–15.4)
Immature Granulocytes: 0 % (ref 0.0–5.0)
Lymphocytes: 24 % (ref 13–44)
MCH: 30.3 PG (ref 26.1–32.9)
MCHC: 34.5 g/dL (ref 31.4–35.0)
MCV: 87.7 FL (ref 79.6–97.8)
MPV: 10.3 FL (ref 9.4–12.3)
Monocytes: 5 % (ref 4.0–12.0)
Platelets: 240 10*3/uL (ref 150–450)
RBC: 4.39 M/uL (ref 4.05–5.2)
RDW: 12.3 % (ref 11.9–14.6)
Seg Neutrophils: 68 % (ref 43–78)
Segs Absolute: 4.7 10*3/uL (ref 1.7–8.2)
WBC: 6.9 10*3/uL (ref 4.3–11.1)
nRBC: 0 10*3/uL (ref 0.0–0.2)

## 2020-11-11 NOTE — Progress Notes (Signed)
Jill Kaufman    Chief Complaint:    Chief Complaint   Kaufman presents with   ??? Follow-up         History of Present Illness:  Jill Kaufman is a 55 y.o. female who returns today for management of breast cancer.  She underwent routine mammogram in September 2019 which showed an irregular mass density in the upper outer right breast.  She underwent ultrasound and biopsy which showed invasive ductal carcinoma, grade 2, ER 95%, PR 95%, HER2 negative.  MRI showed the lesion to  be 1.5 cm with no other abnormalities.  She was referred to Dr. Domenica Fail and underwent lumpectomy and sentinel node biopsy on 05/04/18 which showed 1.6 cm of tumor with negative margins and 1 sentinel lymph node negative for tumor.  Oncotype Dx testing  was low risk at 6; therefore, chemotherapy can be safely deferred.  We will refer her for radiation therapy, then begin endocrine therapy.  She has completed hysterectomy but her ovaries are intact,  her FSH/LH/E2 levels were borderline so we felt it would be prudent to proceed with tamoxifen for 2-3 years, then transition to AI once she is clearly post-menopausal.       She returns for routine follow-up.  She continues to tolerate tamoxifen very well.  There are no GI or bowel complaints.  Appetite is good and weight is stable.  Good energy level.  No shortness of breath, chest pain, or edema.  No breast masses or lumps, she did have some eczema over her breast diagnosed by dermatology, she uses Aquaphor daily with good results.       Review of Systems:  Constitutional: Negative.   HENT: Negative.   Eyes: Negative.   Respiratory: Negative.   Cardiovascular: Negative.   Gastrointestinal: Negative.   Genitourinary: Negative.   Musculoskeletal: Negative.   Skin: Negative.   Neurological: Negative.   Endo/Heme/Allergies: Negative.   Psychiatric/Behavioral: Negative.   All other systems reviewed and are negative.       Allergies   Allergen Reactions    ??? Codeine Other (See Comments)     Past Medical History:   Diagnosis Date   ??? Breast cancer ([place])     T1cN0 right breast cancer   ??? GERD (gastroesophageal reflux disease)     OTC meds as needed    ??? High cholesterol    ??? Nausea & vomiting    ??? Thyroid disease 06/14/2011    Hypothyroidism      Past Surgical History:   Procedure Laterality Date   ??? BREAST BIOPSY Right 05/04/2018    RIGHT BREAST NEEDLE LOCALIZED BIOPSY performed by Lorenda Cahill, MD at [place]   ??? BREAST LUMPECTOMY Right 05/04/2018    RIGHT BREAST LUMPECTOMY performed by Lorenda Cahill, MD at [place]   ??? GYN      hysterectomy partial-still has ovaries   ??? US GUIDED NEEDLE LOC OF RIGHT BREAST Right 05/04/2018    US GUIDED NEEDLE LOC OF RIGHT BREAST 05/04/2018 [facility]     Family History   Problem Relation Age of Onset   ??? Heart Failure Mother    ??? Cancer Father         throat cancer, skin cancer    ??? Diabetes Father    ??? Heart Attack Father         80's, now late 60's   ??? Heart Attack Mother  late 60's, currently in her 33's     Social History     Socioeconomic History   ??? Marital status: Married     Spouse name: Not on file   ??? Number of children: Not on file   ??? Years of education: Not on file   ??? Highest education level: Not on file   Occupational History   ??? Not on file   Tobacco Use   ??? Smoking status: Never Smoker   ??? Smokeless tobacco: Never Used   Substance and Sexual Activity   ??? Alcohol use: Yes   ??? Drug use: Not Currently   ??? Sexual activity: Not on file   Other Topics Concern   ??? Not on file   Social History Narrative   ??? Not on file     Social Determinants of Health     Financial Resource Strain:    ??? Difficulty of Paying Living Expenses: Not on file   Food Insecurity:    ??? Worried About Running Out of Food in the Last Year: Not on file   ??? Ran Out of Food in the Last Year: Not on file   Transportation Needs:    ??? Lack of Transportation (Medical): Not on file   ??? Lack of Transportation (Non-Medical): Not  on file   Physical Activity:    ??? Days of Exercise per Week: Not on file   ??? Minutes of Exercise per Session: Not on file   Stress:    ??? Feeling of Stress : Not on file   Social Connections:    ??? Frequency of Communication with Friends and Family: Not on file   ??? Frequency of Social Gatherings with Friends and Family: Not on file   ??? Attends Religious Services: Not on file   ??? Active Member of Clubs or Organizations: Not on file   ??? Attends Archivist Meetings: Not on file   ??? Marital Status: Not on file   Intimate Partner Violence:    ??? Fear of Current or Ex-Partner: Not on file   ??? Emotionally Abused: Not on file   ??? Physically Abused: Not on file   ??? Sexually Abused: Not on file   Housing Stability:    ??? Unable to Pay for Housing in the Last Year: Not on file   ??? Number of Places Lived in the Last Year: Not on file   ??? Unstable Housing in the Last Year: Not on file     Current Outpatient Medications   Medication Sig Dispense Refill   ??? levothyroxine (SYNTHROID) 75 MCG tablet Take 75 mcg by mouth every morning (before breakfast)     ??? lovastatin (MEVACOR) 10 MG tablet Take 10 mg by mouth daily     ??? tamoxifen (NOLVADEX) 20 MG tablet Take 20 mg by mouth daily       No current facility-administered medications for this visit.       OBJECTIVE:  BP 113/75    Pulse 82    Temp 98.2 ??F (36.8 ??C) (Oral)    Resp 16    Wt 149 lb 11.2 oz (67.9 kg)    SpO2 100%    BMI 25.70 kg/m??     Physical Exam:  Constitutional: Well developed, well nourished female in no acute distress, sitting comfortably in the exam room chair.    HEENT: Normocephalic and atraumatic. Sclerae anicteric. Neck supple without JVD. No thyromegaly present.    Lymph node   No palpable submandibular,  cervical, supraclavicular lymph nodes.   Skin Warm and dry.  No bruising and no rash noted.  No erythema.  No pallor.    Respiratory Lungs are clear to auscultation bilaterally without wheezes, rales or rhonchi, normal air exchange without accessory  muscle use.    CVS Normal rate, regular rhythm and normal S1 and S2.  No murmurs, gallops, or rubs.   Neuro Grossly nonfocal with no obvious sensory or motor deficits.   MSK Normal range of motion in general.  No edema and no tenderness.   Psych Appropriate mood and affect.      Labs:  Recent Results (from the past 96 hour(s))   CBC with Auto Differential    Collection Time: 11/11/20  3:38 PM   Result Value Ref Range    WBC 6.9 4.3 - 11.1 K/uL    RBC 4.39 4.05 - 5.2 M/uL    Hemoglobin 13.3 11.7 - 15.4 g/dL    Hematocrit 38.5 35.8 - 46.3 %    MCV 87.7 79.6 - 97.8 FL    MCH 30.3 26.1 - 32.9 PG    MCHC 34.5 31.4 - 35.0 g/dL    RDW 12.3 11.9 - 14.6 %    Platelets 240 150 - 450 K/uL    MPV 10.3 9.4 - 12.3 FL    nRBC 0.00 0.0 - 0.2 K/uL    Seg Neutrophils 68 43 - 78 %    Lymphocytes 24 13 - 44 %    Monocytes 5 4.0 - 12.0 %    Eosinophils % 2 0.5 - 7.8 %    Basophils 1 0.0 - 2.0 %    Immature Granulocytes 0 0.0 - 5.0 %    Segs Absolute 4.7 1.7 - 8.2 K/UL    Absolute Lymph # 1.6 0.5 - 4.6 K/UL    Absolute Mono # 0.3 0.1 - 1.3 K/UL    Absolute Eos # 0.1 0.0 - 0.8 K/UL    Basophils Absolute 0.1 0.0 - 0.2 K/UL    Absolute Immature Granulocyte 0.0 0.0 - 0.5 K/UL    Differential Type AUTOMATED     Comprehensive Metabolic Panel    Collection Time: 11/11/20  3:38 PM   Result Value Ref Range    Sodium 138 136 - 145 mmol/L    Potassium 3.9 3.5 - 5.1 mmol/L    Chloride 105 98 - 107 mmol/L    CO2 26 21 - 32 mmol/L    Anion Gap 7 7 - 16 mmol/L    Glucose 109 (H) 65 - 100 mg/dL    BUN 17 6 - 23 MG/DL    CREATININE 1.00 0.6 - 1.0 MG/DL    GFR African American >60 >60 ml/min/1.29m    GFR Non-African American >60 >60 ml/min/1.729m   Calcium 9.0 8.3 - 10.4 MG/DL    Total Bilirubin 0.4 0.2 - 1.1 MG/DL    ALT 30 12 - 65 U/L    AST 17 15 - 37 U/L    Alk Phosphatase 60 50 - 136 U/L    Total Protein 7.2 6.3 - 8.2 g/dL    Albumin 3.8 3.5 - 5.0 g/dL    Globulin 3.4 2.3 - 3.5 g/dL    Albumin/Globulin Ratio 1.1 (L) 1.2 - 3.5         Imaging:  MRI  BREAST BILATERAL W WO CONTRAST    Result Date: 11/10/2020  1.  No evidence of tumor recurrence or developing mass. 2.  Small area of skin enhancement  in the lateral right breast, probably reactive.  Suggest clinical correlation. BI-RADS Assessment Category 2: Benign finding.         ASSESSMENT:   Diagnosis Orders   1. 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 Screening mammogram for high-risk Kaufman  MAM TOMO DIGITAL SCREEN BILATERAL         PLAN:  Lab studies were personally reviewed.    Breast cancer: 1.6 cm, grade 2 IDC, sentinel lymph node biopsy negative, ER 95%, PR 95%, HER2 negative.  S/p lumpectomy with negative margins.  She has completed definitive surgery.  Because of  the ER/PR positivity, the negative nodal assessment, and the T1 tumor, she would be an excellent candidate for Oncotype Dx for risk stratification, which was low risk at 6.  Therefore, chemotherapy can be safely deferred.  We will refer her for radiation  therapy, then begin endocrine therapy.  She has completed hysterectomy but her ovaries are intact, her FSH/LH/E2 levels were borderline so we felt it would be prudent to proceed with tamoxifen for 2-3 years, then transition to AI once she is clearly post-menopausal.   We discussed side effects of tamoxifen including the risk of VTE, as well as vasomotor symptoms and arthralgias.  She started tamoxifen after radiation was completed.       She returns for routine follow-up.  She continues to tolerate tamoxifen very well.  There are no GI or bowel complaints.  Appetite is good and weight is stable.  Good energy level.  No shortness of breath, chest pain, or edema.  No breast masses or lumps, she did have some eczema over her breast diagnosed by dermatology, she uses Aquaphor daily with good results.  Labs reviewed and unremarkable.  FSH/LH/E2 pending.  MRI May 2022 shows  no suspicious findings, there is some mild skin enhancement laterally in the right breast, corresponding to some previous eczema - my suspicion for skin involvement of cancer is very low, but she (and dermatology) will monitor this, consider biopsy if suspicion increases.  Mammogram October 2021 negative, repeat October 2022.  We will await endocrine labs but if these are consistent with post-menopause, we will change to Arimidex.  She will also need a bone density scan if we transition to AI.  Side effects reviewed and she agrees to proceed.  All questions were asked and answered to the best of my ability.  F/u in 6 months for recheck.            Greer Ee, MD, MD  [facility] [place] [place] [address]  Office : [phone]  Fax : [phone]

## 2020-11-11 NOTE — Patient Instructions (Addendum)
Patient Instructions from Today's Visit    Reason for Visit:  Follow up- Breast    Diagnosis Information:  [email or web address]://www.cancer.net/about-us/asco-answers-patient-education-materials/asco-answers-fact-sheets      Plan:  - MRI reviewed  - mammogram due in October, order has ben placed.   - hormone levels are pending, continue tamoxifen at this time.     Follow Up:  - 6 months    Recent Lab Results:.  Hospital Outpatient Visit on 11/11/2020   Component Date Value Ref Range Status   ??? WBC 11/11/2020 6.9  4.3 - 11.1 K/uL Final   ??? RBC 11/11/2020 4.39  4.05 - 5.2 M/uL Final   ??? Hemoglobin 11/11/2020 13.3  11.7 - 15.4 g/dL Final   ??? Hematocrit 11/11/2020 38.5  35.8 - 46.3 % Final   ??? MCV 11/11/2020 87.7  79.6 - 97.8 FL Final   ??? MCH 11/11/2020 30.3  26.1 - 32.9 PG Final   ??? MCHC 11/11/2020 34.5  31.4 - 35.0 g/dL Final   ??? RDW 11/11/2020 12.3  11.9 - 14.6 % Final   ??? Platelets 11/11/2020 240  150 - 450 K/uL Final   ??? MPV 11/11/2020 10.3  9.4 - 12.3 FL Final   ??? nRBC 11/11/2020 0.00  0.0 - 0.2 K/uL Final    **Note: Absolute NRBC parameter is now reported with Hemogram**   ??? Seg Neutrophils 11/11/2020 68  43 - 78 % Final   ??? Lymphocytes 11/11/2020 24  13 - 44 % Final   ??? Monocytes 11/11/2020 5  4.0 - 12.0 % Final   ??? Eosinophils % 11/11/2020 2  0.5 - 7.8 % Final   ??? Basophils 11/11/2020 1  0.0 - 2.0 % Final   ??? Immature Granulocytes 11/11/2020 0  0.0 - 5.0 % Final   ??? Segs Absolute 11/11/2020 4.7  1.7 - 8.2 K/UL Final   ??? Absolute Lymph # 11/11/2020 1.6  0.5 - 4.6 K/UL Final   ??? Absolute Mono # 11/11/2020 0.3  0.1 - 1.3 K/UL Final   ??? Absolute Eos # 11/11/2020 0.1  0.0 - 0.8 K/UL Final   ??? Basophils Absolute 11/11/2020 0.1  0.0 - 0.2 K/UL Final   ??? Absolute Immature Granulocyte 11/11/2020 0.0  0.0 - 0.5 K/UL Final   ??? Differential Type 11/11/2020 AUTOMATED    Final       Treatment Summary has been discussed and given to patient:  n/a        -------------------------------------------------------------------------------------------------------------------  Please call our office at [phone] if you have any  of the following symptoms:   ?? Fever of 100.5 or greater  ?? Chills  ?? Shortness of breath  ?? Swelling or pain in one leg    After office hours an answering service is available and will contact a provider for emergencies or if you are experiencing any of the above symptoms.    ??? Patient has My Chart.  My Chart log in information explained on the after visit summary printout at the [place] desk.    Loistine Simas, RN

## 2020-11-12 ENCOUNTER — Encounter

## 2020-11-12 LAB — LUTEINIZING HORMONE: LH: 5.3 m[IU]/mL

## 2020-11-12 LAB — FOLLICLE STIMULATING HORMONE: FSH: 16.7 m[IU]/mL

## 2020-11-12 LAB — ESTRADIOL: Estradiol: <11.8 pg/mL

## 2020-11-12 MED ORDER — ANASTROZOLE 1 MG PO TABS
1 MG | ORAL_TABLET | Freq: Every day | ORAL | 3 refills | Status: DC
Start: 2020-11-12 — End: 2021-02-24
  Filled 2020-11-12: qty 30, 30d supply, fill #0

## 2020-11-18 ENCOUNTER — Inpatient Hospital Stay: Admit: 2020-11-18 | Payer: BLUE CROSS/BLUE SHIELD | Primary: Family Medicine

## 2020-11-18 DIAGNOSIS — Z13820 Encounter for screening for osteoporosis: Secondary | ICD-10-CM

## 2020-12-15 MED FILL — ANASTROZOLE 1MG TABS: 1 mg | 30 days supply | Qty: 30 | Fill #1 | Status: AC

## 2020-12-16 ENCOUNTER — Inpatient Hospital Stay: Admit: 2020-12-16 | Payer: BLUE CROSS/BLUE SHIELD | Primary: Family Medicine

## 2020-12-16 DIAGNOSIS — Z79811 Long term (current) use of aromatase inhibitors: Secondary | ICD-10-CM

## 2020-12-16 LAB — CBC WITH AUTO DIFFERENTIAL
Absolute Eos #: 0.1 10*3/uL (ref 0.0–0.8)
Absolute Immature Granulocyte: 0 10*3/uL (ref 0.0–0.5)
Absolute Lymph #: 1.2 10*3/uL (ref 0.5–4.6)
Absolute Mono #: 0.2 10*3/uL (ref 0.1–1.3)
Basophils Absolute: 0 10*3/uL (ref 0.0–0.2)
Basophils: 1 % (ref 0.0–2.0)
Eosinophils %: 2 % (ref 0.5–7.8)
Hematocrit: 40.3 % (ref 35.8–46.3)
Hemoglobin: 14 g/dL (ref 11.7–15.4)
Immature Granulocytes: 0 % (ref 0.0–5.0)
Lymphocytes: 20 % (ref 13–44)
MCH: 30.5 PG (ref 26.1–32.9)
MCHC: 34.7 g/dL (ref 31.4–35.0)
MCV: 87.8 FL (ref 79.6–97.8)
MPV: 10.6 FL (ref 9.4–12.3)
Monocytes: 3 % — ABNORMAL LOW (ref 4.0–12.0)
Platelets: 209 10*3/uL (ref 150–450)
RBC: 4.59 M/uL (ref 4.05–5.2)
RDW: 12.4 % (ref 11.9–14.6)
Seg Neutrophils: 74 % (ref 43–78)
Segs Absolute: 4.5 10*3/uL (ref 1.7–8.2)
WBC: 6 10*3/uL (ref 4.3–11.1)
nRBC: 0 10*3/uL (ref 0.0–0.2)

## 2020-12-16 LAB — COMPREHENSIVE METABOLIC PANEL
ALT: 27 U/L (ref 12–65)
AST: 20 U/L (ref 15–37)
Albumin/Globulin Ratio: 1 — ABNORMAL LOW (ref 1.2–3.5)
Albumin: 3.7 g/dL (ref 3.5–5.0)
Alk Phosphatase: 70 U/L (ref 50–136)
Anion Gap: 5 mmol/L — ABNORMAL LOW (ref 7–16)
BUN: 16 MG/DL (ref 6–23)
CO2: 27 mmol/L (ref 21–32)
Calcium: 8.8 MG/DL (ref 8.3–10.4)
Chloride: 110 mmol/L — ABNORMAL HIGH (ref 98–107)
Creatinine: 1 MG/DL (ref 0.6–1.0)
GFR African American: >60 mL/min/{1.73_m2} (ref >60)
GFR Non-African American: >60 mL/min/{1.73_m2} (ref >60)
Globulin: 3.7 g/dL — ABNORMAL HIGH (ref 2.3–3.5)
Glucose: 155 mg/dL — ABNORMAL HIGH (ref 65–100)
Potassium: 3.6 mmol/L (ref 3.5–5.1)
Sodium: 142 mmol/L (ref 136–145)
Total Bilirubin: 0.4 MG/DL (ref 0.2–1.1)
Total Protein: 7.4 g/dL (ref 6.3–8.2)

## 2020-12-16 LAB — FOLLICLE STIMULATING HORMONE: FSH: 13.1 m[IU]/mL

## 2020-12-16 LAB — LUTEINIZING HORMONE: LH: 4.1 m[IU]/mL

## 2020-12-16 LAB — ESTRADIOL: Estradiol: 31.54 pg/mL

## 2020-12-17 ENCOUNTER — Telehealth

## 2020-12-17 NOTE — Telephone Encounter (Signed)
Pt need to schedule an lab appt

## 2020-12-17 NOTE — Telephone Encounter (Signed)
Per Dr. Jenetta Loges pt needs to have lab only visit in 1 month to recheck hormone levels.   Called patient and LVM that this needs to be done and asked pt to call the office to set up appt.   Will forward to scheduling for further follow up.

## 2020-12-21 NOTE — Telephone Encounter (Signed)
Spoke w pt and confirmed she is taking tamoxifen and explained she only needs to come in for labs if she is on the arimidex. Pt verbalized understanding. KO

## 2021-01-01 ENCOUNTER — Encounter: Payer: BLUE CROSS/BLUE SHIELD | Attending: Nurse Practitioner | Primary: Family Medicine

## 2021-01-03 NOTE — Progress Notes (Signed)
Pt cancelled

## 2021-02-16 MED FILL — TAMOXIFEN CITRATE 20MG TABS: 20 mg | 90 days supply | Qty: 90 | Fill #1 | Status: AC

## 2021-02-17 ENCOUNTER — Encounter: Admit: 2021-02-17 | Discharge: 2021-02-17 | Payer: BLUE CROSS/BLUE SHIELD | Primary: Family Medicine

## 2021-02-17 ENCOUNTER — Encounter: Primary: Family Medicine

## 2021-02-17 DIAGNOSIS — E7800 Pure hypercholesterolemia, unspecified: Secondary | ICD-10-CM

## 2021-02-18 LAB — COMPREHENSIVE METABOLIC PANEL
ALT: 20 U/L (ref 12–65)
AST: 14 U/L — ABNORMAL LOW (ref 15–37)
Albumin/Globulin Ratio: 1.2 (ref 1.2–3.5)
Albumin: 4 g/dL (ref 3.5–5.0)
Alk Phosphatase: 57 U/L (ref 50–136)
Anion Gap: 8 mmol/L (ref 4–13)
BUN: 21 MG/DL (ref 6–23)
CO2: 25 mmol/L (ref 21–32)
Calcium: 9.1 MG/DL (ref 8.3–10.4)
Chloride: 106 mmol/L (ref 101–110)
Creatinine: 1 MG/DL (ref 0.6–1.0)
GFR African American: >60 mL/min/{1.73_m2} (ref >60)
GFR Non-African American: >60 mL/min/{1.73_m2} (ref >60)
Globulin: 3.3 g/dL (ref 2.3–3.5)
Glucose: 109 mg/dL — ABNORMAL HIGH (ref 65–100)
Potassium: 4 mmol/L (ref 3.5–5.1)
Sodium: 139 mmol/L (ref 136–145)
Total Bilirubin: 0.5 MG/DL (ref 0.2–1.1)
Total Protein: 7.3 g/dL (ref 6.3–8.2)

## 2021-02-18 LAB — LIPID PANEL
Chol/HDL Ratio: 5.5
Cholesterol, Total: 214 MG/DL — ABNORMAL HIGH (ref <200)
HDL: 39 MG/DL — ABNORMAL LOW (ref 40–60)
LDL Calculated: 108.4 MG/DL — ABNORMAL HIGH (ref <100)
Triglycerides: 333 MG/DL — ABNORMAL HIGH (ref 35–150)
VLDL Cholesterol Calculated: 66.6 MG/DL — ABNORMAL HIGH (ref 6.0–23.0)

## 2021-02-18 LAB — TSH: TSH, 3RD GENERATION: 1.44 u[IU]/mL (ref 0.358–3.740)

## 2021-02-24 ENCOUNTER — Ambulatory Visit
Admit: 2021-02-24 | Discharge: 2021-02-24 | Payer: BLUE CROSS/BLUE SHIELD | Attending: Family Medicine | Primary: Family Medicine

## 2021-02-24 ENCOUNTER — Encounter: Attending: Family Medicine | Primary: Family Medicine

## 2021-02-24 DIAGNOSIS — E781 Pure hyperglyceridemia: Secondary | ICD-10-CM

## 2021-02-24 NOTE — Progress Notes (Signed)
SUBJECTIVE:   Jill Kaufman is a 55 y.o. female who has a past medical history significant for hypothyroidism, high cholesterol, high triglycerides and breast cancer.  At previous visit the patient had complained of palpitations.  Please refer to prior notes for details.  Cardiology work-up has been negative and symptoms have resolved.    In addition the patient complained of bilateral tinnitus.  Referral to ENT was requested.  Work-up was negative.  Symptoms have not worsened but have not improved.    Patient reports no active chest pain, shortness of breath, orthopnea or PND.  Current medicines are listed in the EMR and reviewed today.    HPI  See above    Past Medical History, Past Surgical History, Family history, Social History, and Medications were all reviewed with the patient today and updated as necessary.       Current Outpatient Medications   Medication Sig Dispense Refill    levothyroxine (SYNTHROID) 75 MCG tablet Take 75 mcg by mouth every morning (before breakfast)      lovastatin (MEVACOR) 10 MG tablet Take 10 mg by mouth daily      tamoxifen (NOLVADEX) 20 MG tablet Take 20 mg by mouth daily       No current facility-administered medications for this visit.     Allergies   Allergen Reactions    Codeine Other (See Comments)     Patient Active Problem List   Diagnosis    S/P lumpectomy of breast    Malignant neoplasm of central portion of right breast in female, estrogen receptor positive ([facility])    Abnormal breast finding    Statin intolerance    Abnormal results of liver function studies    Hypothyroidism    Mixed hyperlipidemia    Pain in joint, lower leg    Vitamin D deficiency     Past Medical History:   Diagnosis Date    Breast cancer ([place])     T1cN0 right breast cancer    GERD (gastroesophageal reflux disease)     OTC meds as needed     High cholesterol     Nausea & vomiting     Thyroid disease 06/14/2011    Hypothyroidism      Past Surgical History:   Procedure Laterality Date    BREAST BIOPSY Right  05/04/2018    RIGHT BREAST NEEDLE LOCALIZED BIOPSY performed by Lorenda Cahill, MD at [place] LUMPECTOMY Right 05/04/2018    RIGHT BREAST LUMPECTOMY performed by Lorenda Cahill, MD at [place]      hysterectomy partial-still has ovaries    US GUIDED NEEDLE LOC OF RIGHT BREAST Right 05/04/2018    US GUIDED NEEDLE LOC OF RIGHT BREAST 05/04/2018 [facility]     Family History   Problem Relation Age of Onset    Heart Failure Mother     Cancer Father         throat cancer, skin cancer     Diabetes Father     Heart Attack Father         60's, now late 83's    Heart Attack Mother         late 60's, currently in her 30's     Social History     Tobacco Use    Smoking status: Never    Smokeless tobacco: Never   Substance Use Topics    Alcohol use: Yes  Review of Systems  See above    OBJECTIVE:  BP 118/70    Ht '5\' 4"'$  (1.626 m)    Wt 149 lb 6.4 oz (67.8 kg)    BMI 25.64 kg/m??      Physical Exam  Constitutional:       General: She is not in acute distress.     Appearance: Normal appearance. She is not ill-appearing.   HENT:      Head: Normocephalic and atraumatic.   Cardiovascular:      Rate and Rhythm: Normal rate and regular rhythm.      Heart sounds: Normal heart sounds. No murmur heard.  Pulmonary:      Effort: Pulmonary effort is normal.      Breath sounds: Normal breath sounds. No wheezing or rhonchi.   Musculoskeletal:         General: Normal range of motion.      Cervical back: Normal range of motion and neck supple.      Right lower leg: No edema.      Left lower leg: No edema.   Skin:     General: Skin is warm.      Findings: No rash.   Neurological:      Mental Status: She is alert and oriented to person, place, and time.   Psychiatric:         Mood and Affect: Mood normal.         Behavior: Behavior normal.         Thought Content: Thought content normal.         Judgment: Judgment normal.       Medical problems and test results were reviewed with the patient today.          ASSESSMENT and PLAN    1.  High triglycerides.  Triglycerides were 333.  Previously 164.  Patient acknowledges that her labs were nonfasting.  Reevaluate at follow-up with dietary focus.    2.  High cholesterol.  LDL 108.  Previously 135.  Continue Mevacor.  Liver functions remain normal.    3.  Hypothyroidism.  TSH 1.4.    4.  Palpitations.  Negative cardiac work-up.  No complaints.    5.  Bilateral tenderness.  Negative ENT work-up.  No worsening complaints.    Elements of this note have been dictated using speech recognition software. As a result, errors of speech recognition may have occurred.

## 2021-03-22 ENCOUNTER — Ambulatory Visit: Payer: BLUE CROSS/BLUE SHIELD | Primary: Family Medicine

## 2021-03-22 ENCOUNTER — Inpatient Hospital Stay: Admit: 2021-03-22 | Payer: BLUE CROSS/BLUE SHIELD | Primary: Family Medicine

## 2021-03-22 ENCOUNTER — Other Ambulatory Visit: Payer: Self-pay

## 2021-03-22 ENCOUNTER — Emergency Department
Admission: EM | Admit: 2021-03-22 | Discharge: 2021-03-22 | Disposition: A | Discharge status: Home / Self Care | Payer: BC Managed Care – PPO | Attending: Emergency Medicine | Admitting: Emergency Medicine

## 2021-03-22 ENCOUNTER — Ambulatory Visit: Payer: Self-pay | Admitting: *Deleted

## 2021-03-22 DIAGNOSIS — Z1231 Encounter for screening mammogram for malignant neoplasm of breast: Secondary | ICD-10-CM

## 2021-03-22 DIAGNOSIS — S91122A Laceration with foreign body of left great toe without damage to nail, initial encounter: Secondary | ICD-10-CM | POA: Insufficient documentation

## 2021-03-22 DIAGNOSIS — S93402A Sprain of unspecified ligament of left ankle, initial encounter: Secondary | ICD-10-CM

## 2021-03-22 DIAGNOSIS — W130XXA Fall from, out of or through balcony, initial encounter: Secondary | ICD-10-CM | POA: Diagnosis not present

## 2021-03-22 DIAGNOSIS — M25551 Pain in right hip: Secondary | ICD-10-CM

## 2021-03-22 DIAGNOSIS — M25561 Pain in right knee: Secondary | ICD-10-CM | POA: Diagnosis not present

## 2021-03-22 DIAGNOSIS — F17210 Nicotine dependence, cigarettes, uncomplicated: Secondary | ICD-10-CM | POA: Diagnosis not present

## 2021-03-22 DIAGNOSIS — W19XXXA Unspecified fall, initial encounter: Secondary | ICD-10-CM

## 2021-03-22 DIAGNOSIS — S6992XA Unspecified injury of left wrist, hand and finger(s), initial encounter: Secondary | ICD-10-CM | POA: Diagnosis present

## 2021-03-22 DIAGNOSIS — S91112A Laceration without foreign body of left great toe without damage to nail, initial encounter: Secondary | ICD-10-CM

## 2021-03-22 DIAGNOSIS — Y92009 Unspecified place in unspecified non-institutional (private) residence as the place of occurrence of the external cause: Secondary | ICD-10-CM

## 2021-03-22 NOTE — Telephone Encounter (Signed)
Calling with injury to left foot and right shin after falling thru board on her deck. Lacerations to both areas, was bleeding but she has them wrapped at this time. Stated she did not hit her head-has been up and moving. Thinks she is up to date with Tetanus. Patient called thinking we are an Urgent Care. Made patient aware we are not. Encouraged patient to seek evaluation at an UC and to call her pcp for support. Patient agreed.    Reason for Disposition  Sounds like a serious injury to the triager  Answer Assessment - Initial Assessment Questions 1. MECHANISM: "How did the fall happen?"     Fell after board broke on her deck 2. DOMESTIC VIOLENCE AND ELDER ABUSE SCREENING: "Did you fall because someone pushed you or tried to hurt you?" If Yes, ask: "Are you safe now?"     no 3. ONSET: "When did the fall happen?" (e.g., minutes, hours, or days ago)     Larey Seat thru a board. 4. LOCATION: "What part of the body hit the ground?" (e.g., back, buttocks, head, hips, knees, hands, head, stomach)     Left ankle and foot with right shin cut open 5. INJURY: "Did you hurt (injure) yourself when you fell?" If Yes, ask: "What did you injure? Tell me more about this?" (e.g., body area; type of injury; pain severity)"     Yes. Feet and right shin cut and bleeding 6. PAIN: "Is there any pain?" If Yes, ask: "How bad is the pain?" (e.g., Scale 1-10; or mild,  moderate, severe)   - NONE (0): No pain   - MILD (1-3): Doesn't interfere with normal activities    - MODERATE (4-7): Interferes with normal activities or awakens from sleep    - SEVERE (8-10): Excruciating pain, unable to do any normal activities      Painful but can walk 7. SIZE: For cuts, bruises, or swelling, ask: "How large is it?" (e.g., inches or centimeters)      Laceration with bleeding 8. PREGNANCY: "Is there any chance you are pregnant?" "When was your last menstrual period?"     no 9. OTHER SYMPTOMS: "Do you have any other symptoms?" (e.g.,  dizziness, fever, weakness; new onset or worsening).      no 10. CAUSE: "What do you think caused the fall (or falling)?" (e.g., tripped, dizzy spell)       Rotten board  Protocols used: Falls and [facility]

## 2021-03-22 NOTE — ED Notes (Signed)
See triage note  presents s/p fall  states she went thur part of her deck   having some back pain and toe pain
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
## 2021-03-23 ENCOUNTER — Ambulatory Visit: Payer: BLUE CROSS/BLUE SHIELD | Primary: Family Medicine

## 2021-04-01 ENCOUNTER — Encounter

## 2021-04-01 ENCOUNTER — Inpatient Hospital Stay: Admit: 2021-04-01 | Payer: BLUE CROSS/BLUE SHIELD | Primary: Family Medicine

## 2021-04-01 DIAGNOSIS — M7731 Calcaneal spur, right foot: Secondary | ICD-10-CM

## 2021-04-16 MED FILL — FLOWFLEX COVID-19 AG HOME TES  KIT: 8 days supply | Qty: 8 | Fill #0 | Status: AC

## 2021-04-18 ENCOUNTER — Emergency Department: Admit: 2021-04-18 | Payer: BLUE CROSS/BLUE SHIELD | Primary: Family Medicine

## 2021-04-18 ENCOUNTER — Inpatient Hospital Stay
Admit: 2021-04-18 | Discharge: 2021-04-18 | Disposition: A | Discharge status: Home / Self Care | Payer: BLUE CROSS/BLUE SHIELD | Attending: Emergency Medicine

## 2021-04-18 DIAGNOSIS — J40 Bronchitis, not specified as acute or chronic: Secondary | ICD-10-CM

## 2021-04-18 MED ORDER — BENZONATATE 200 MG PO CAPS
200 MG | ORAL_CAPSULE | Freq: Three times a day (TID) | ORAL | 0 refills | Status: DC | PRN
Start: 2021-04-18 — End: 2021-04-26

## 2021-04-18 MED ORDER — ALBUTEROL SULFATE HFA 108 (90 BASE) MCG/ACT IN AERS
10890 (90 Base) MCG/ACT | Freq: Four times a day (QID) | RESPIRATORY_TRACT | 3 refills | Status: DC | PRN
Start: 2021-04-18 — End: 2021-04-26

## 2021-04-18 MED ORDER — BENZONATATE 100 MG PO CAPS
100 MG | ORAL | Status: AC
Start: 2021-04-18 — End: 2021-04-18
  Administered 2021-04-18: 21:00:00 200 mg via ORAL

## 2021-04-18 MED FILL — BENZONATATE 100 MG PO CAPS: 100 MG | ORAL | Qty: 2

## 2021-04-18 NOTE — ED Triage Notes (Signed)
Paent reports having the flu last week, but cough is persisting and she feels fluid in her lungs and is worried about pneumonia.

## 2021-04-18 NOTE — Discharge Instructions (Signed)
Use inhlaer 2 puffs every 6 hours  1,200mg  mucinex DM every 12 hours for a week.  Try a sustained release sudafed every morning,  DIS-continue DayQuil/NyQuil, other combination remedies  Drink plenty of fluids    Alternate 4 ibuprofen every 8 hours with 2 extra-strength tylenol every 6 hours

## 2021-04-18 NOTE — ED Provider Notes (Signed)
[facility] Emergency Department Provider Note                   PCP:                Elvis Coil, MD               Age: 55 y.o.      Sex: female       ICD-10-CM    1. Bronchitis  J40           DISPOSITION Decision To Discharge 04/18/2021 03:47:02 PM       Discharge Medication List as of 04/18/2021  3:35 PM        START taking these medications    Details   albuterol sulfate HFA (PROVENTIL HFA) 108 (90 Base) MCG/ACT inhaler Inhale 2 puffs into the lungs every 6 hours as needed for Wheezing, Disp-18 g, R-3Print      benzonatate (TESSALON) 200 MG capsule Take 1 capsule by mouth 3 times daily as needed for Cough, Disp-21 capsule, R-0Print             Orders Placed This Encounter   Procedures    XR CHEST (2 VW)         Jill Kaufman is a 55 y.o. female who presents to the Emergency Department with chief complaint of    Chief Complaint   Patient presents with    Cough      Chief complaint :  cough    HISTORY OF PRESENT ILLNESS :  Location : chest    Quality : non-productive    Quantity : constant    Timing : close to two weeks    Severity : mild    Context : had the flu the week before last, but cough persists    Alleviating / exacerbating factors : no relief with otc's    Associated Symptoms : no fever or dyspnea    -------------------------------    SOCIAL HISTORY : married, nonsmoker, some alcohol intake        Review of Systems   Constitutional:  Negative for chills and fever.   HENT:  Negative for congestion and sore throat.    Eyes:  Negative for redness.   Respiratory:  Negative for cough, shortness of breath and wheezing.    Cardiovascular:  Negative for chest pain and palpitations.   Gastrointestinal:  Negative for nausea and vomiting.   Musculoskeletal:  Negative for arthralgias and myalgias.   Skin:  Negative for pallor and rash.   Neurological:  Negative for dizziness and light-headedness.   All other systems reviewed and are negative.   All other systems reviewed and are negative.      Past Medical History:    Diagnosis Date    Breast cancer ([place])     T1cN0 right breast cancer    GERD (gastroesophageal reflux disease)     OTC meds as needed     High cholesterol     History of therapeutic radiation     Nausea & vomiting     Thyroid disease 06/14/2011    Hypothyroidism         Past Surgical History:   Procedure Laterality Date    BREAST BIOPSY Right 05/04/2018    RIGHT BREAST NEEDLE LOCALIZED BIOPSY performed by Lorenda Cahill, MD at [place] LUMPECTOMY Right 05/04/2018    RIGHT BREAST LUMPECTOMY performed by Lorenda Cahill, MD at [place]  [facility]      hysterectomy partial-still has ovaries    HYSTERECTOMY (CERVIX STATUS UNKNOWN)      US GUIDED NEEDLE LOC OF RIGHT BREAST Right 05/04/2018    US GUIDED NEEDLE LOC OF RIGHT BREAST 05/04/2018 [facility]        Family History   Problem Relation Age of Onset    Heart Failure Mother     Cancer Father         throat cancer, skin cancer     Diabetes Father     Heart Attack Father         58's, now late 22's    Heart Attack Mother         late 29's, currently in her 36's        Social Connections: Not on file        Allergies   Allergen Reactions    Codeine Other (See Comments)        Vitals signs and nursing note reviewed.   No data found.       Physical Exam  Vitals and nursing note reviewed.   Constitutional:       General: She is not in acute distress.     Appearance: Normal appearance. She is not ill-appearing, toxic-appearing or diaphoretic.   HENT:      Head: Normocephalic and atraumatic.      Right Ear: External ear normal.      Left Ear: External ear normal.      Nose: Nose normal. No rhinorrhea.      Mouth/Throat:      Mouth: Mucous membranes are moist.      Pharynx: Oropharynx is clear. No oropharyngeal exudate or posterior oropharyngeal erythema.   Eyes:      General: No scleral icterus.        Right eye: No discharge.         Left eye: No discharge.      Extraocular Movements: Extraocular movements intact.      Conjunctiva/sclera:  Conjunctivae normal.   Cardiovascular:      Rate and Rhythm: Normal rate and regular rhythm.   Pulmonary:      Effort: Pulmonary effort is normal. No respiratory distress.      Breath sounds: Normal breath sounds. No wheezing, rhonchi or rales.   Musculoskeletal:         General: No deformity or signs of injury. Normal range of motion.      Cervical back: Normal range of motion and neck supple. No rigidity.   Lymphadenopathy:      Cervical: No cervical adenopathy.   Skin:     General: Skin is warm and dry.      Coloration: Skin is not jaundiced or pale.   Neurological:      General: No focal deficit present.      Mental Status: She is alert and oriented to person, place, and time.   Psychiatric:         Mood and Affect: Mood normal.         Behavior: Behavior normal.        MDM  Number of Diagnoses or Management Options  Bronchitis: new, needed workup  Diagnosis management comments: Bronchitis post-flu  Cxr clear       Amount and/or Complexity of Data Reviewed  Tests in the radiology section of CPT??: ordered and reviewed  Decide to obtain previous medical records or to obtain history from someone other than the patient: yes  Risk of Complications, Morbidity, and/or Mortality  Presenting problems: moderate  Diagnostic procedures: low  Management options: low    Patient Progress  Patient progress: improved      Procedures    Labs Reviewed - No data to display     XR CHEST (2 VW)   Final Result   Unremarkable two-view chest.                     Glasgow Coma Scale  Eye Opening: Spontaneous  Best Verbal Response: Oriented  Best Motor Response: Obeys commands  Glasgow Coma Scale Score: 15                     Voice dictation software was used during the making of this note.  This software is not perfect and grammatical and other typographical errors may be present.  This note has not been completely proofread for errors.       Collene Leyden, MD  05/02/21 [phone]

## 2021-04-18 NOTE — ED Notes (Signed)
I have reviewed discharge instructions with the patient.  The patient verbalized understanding.    Patient left ED via Discharge Method: ambulatory to Home with self    Opportunity for questions and clarification provided.       Patient given 2 scripts.         To continue your aftercare when you leave the hospital, you may receive an automated call from our care team to check in on how you are doing.  This is a free service and part of our promise to provide the best care and service to meet your aftercare needs.??? If you have questions, or wish to unsubscribe from this service please call [phone].  Thank you for Choosing our [facility] Emergency Department.        Zipporah Plants, RN  04/18/21 [phone]

## 2021-04-26 ENCOUNTER — Ambulatory Visit
Admit: 2021-04-26 | Discharge: 2021-04-26 | Payer: BLUE CROSS/BLUE SHIELD | Attending: Family Medicine | Primary: Family Medicine

## 2021-04-26 DIAGNOSIS — R052 Subacute cough: Secondary | ICD-10-CM

## 2021-04-26 MED ORDER — TRIAMCINOLONE ACETONIDE 40 MG/ML IJ SUSP
40 MG/ML | Freq: Once | INTRAMUSCULAR | Status: AC
Start: 2021-04-26 — End: 2021-04-26
  Administered 2021-04-26: 16:00:00 40 mg via INTRAMUSCULAR

## 2021-04-26 MED ORDER — AZITHROMYCIN 250 MG PO TABS
250 MG | ORAL_TABLET | ORAL | 0 refills | Status: DC
Start: 2021-04-26 — End: 2021-05-13

## 2021-04-26 MED ORDER — AZITHROMYCIN 250 MG PO TABS
250 MG | ORAL_TABLET | ORAL | 0 refills | Status: DC
Start: 2021-04-26 — End: 2021-04-26

## 2021-04-26 NOTE — Progress Notes (Signed)
SUBJECTIVE:   Jill Kaufman is a 56 y.o. female who has a past medical history significant for hypothyroidism, high cholesterol, high triglycerides and breast cancer.  Presents today reporting that about a week and a half ago she was exposed to the flu with several family members who were tested positive.  She subsequently developed a fever, body aches and cough.  Fever and body aches resolved.  She never tested for the flu.  However her cough is persisted and is now productive of a yellowish sputum.  She went to the emergency department where she had an x-ray that was negative.  She was given Ladona Ridgel and a albuterol inhaler.  This is not helped.  She reports no wheezing, chest pain or shortness of breath.    HPI  See above    Past Medical History, Past Surgical History, Family history, Social History, and Medications were all reviewed with the patient today and updated as necessary.       Current Outpatient Medications   Medication Sig Dispense Refill    levothyroxine (SYNTHROID) 75 MCG tablet Take 75 mcg by mouth every morning (before breakfast)      lovastatin (MEVACOR) 10 MG tablet Take 10 mg by mouth daily      tamoxifen (NOLVADEX) 20 MG tablet Take 20 mg by mouth daily      albuterol sulfate HFA (PROVENTIL HFA) 108 (90 Base) MCG/ACT inhaler Inhale 2 puffs into the lungs every 6 hours as needed for Wheezing (Patient not taking: Reported on 04/26/2021) 18 g 3    benzonatate (TESSALON) 200 MG capsule Take 1 capsule by mouth 3 times daily as needed for Cough (Patient not taking: Reported on 04/26/2021) 21 capsule 0     No current facility-administered medications for this visit.     Allergies   Allergen Reactions    Codeine Other (See Comments)     Patient Active Problem List   Diagnosis    S/P lumpectomy of breast    Malignant neoplasm of central portion of right breast in female, estrogen receptor positive (HCC)    Abnormal breast finding    Statin intolerance    Abnormal results of liver function studies     Hypothyroidism    Mixed hyperlipidemia    Pain in joint, lower leg    Vitamin D deficiency     Past Medical History:   Diagnosis Date    Breast cancer ([place])     T1cN0 right breast cancer    GERD (gastroesophageal reflux disease)     OTC meds as needed     High cholesterol     History of therapeutic radiation     Nausea & vomiting     Thyroid disease 06/14/2011    Hypothyroidism      Past Surgical History:   Procedure Laterality Date    BREAST BIOPSY Right 05/04/2018    RIGHT BREAST NEEDLE LOCALIZED BIOPSY performed by Lorenda Cahill, MD at [place] LUMPECTOMY Right 05/04/2018    RIGHT BREAST LUMPECTOMY performed by Lorenda Cahill, MD at [place]      hysterectomy partial-still has ovaries    HYSTERECTOMY (CERVIX STATUS UNKNOWN)      US GUIDED NEEDLE LOC OF RIGHT BREAST Right 05/04/2018    US GUIDED NEEDLE LOC OF RIGHT BREAST 05/04/2018 [facility]     Family History   Problem Relation Age of Onset    Heart Failure Mother  Cancer Father         throat cancer, skin cancer     Diabetes Father     Heart Attack Father         3's, now late 56's    Heart Attack Mother         late 64's, currently in her 105's     Social History     Tobacco Use    Smoking status: Never    Smokeless tobacco: Never   Substance Use Topics    Alcohol use: Yes         Review of Systems  See above    OBJECTIVE:  BP 120/80    Ht 5\' 3"  (1.6 m)    Wt 148 lb 9.6 oz (67.4 kg)    BMI 26.32 kg/m??      Physical Exam  Constitutional:       General: She is not in acute distress.     Appearance: Normal appearance. She is not ill-appearing.   HENT:      Head: Normocephalic and atraumatic.   Cardiovascular:      Rate and Rhythm: Normal rate and regular rhythm.      Heart sounds: Normal heart sounds. No murmur heard.  Pulmonary:      Effort: Pulmonary effort is normal.      Breath sounds: Rhonchi present. No wheezing.      Comments: A few scattered rhonchi are appreciated in both lung fields.  No wheezes are  noted.  Musculoskeletal:         General: Normal range of motion.      Cervical back: Normal range of motion and neck supple.      Right lower leg: No edema.      Left lower leg: No edema.   Skin:     General: Skin is warm.      Findings: No rash.   Neurological:      Mental Status: She is alert and oriented to person, place, and time.   Psychiatric:         Mood and Affect: Mood normal.         Behavior: Behavior normal.         Thought Content: Thought content normal.         Judgment: Judgment normal.       Medical problems and test results were reviewed with the patient today.         ASSESSMENT and PLAN    1.  Cough.  Productive of yellow sputum.  Consistent with bronchitis.  Over-the-counter plain Mucinex.  Z-Pak as directed.  Delsym as needed.  Return if symptoms persist or worsen.  Recent chest x-ray negative for pneumonia.    2.  Bronchitis.  As above.    3.  Allergic rhinitis.  History of allergies which could be contributing.  As above.  1 cc of Kenalog IM.    Elements of this note have been dictated using speech recognition software. As a result, errors of speech recognition may have occurred.

## 2021-04-27 ENCOUNTER — Encounter

## 2021-04-27 NOTE — Telephone Encounter (Signed)
From: MA Chasiny G  To: Jill Kaufman  Sent: 04/27/2021 3:47 PM EST  Subject: Colon Cancer Screening Due    Good Afternoon,  According to our records, it is time for you to have colon cancer screening. Early detection saves lives. We encourage you to make a appointment and talk to our providers about having a colonoscopy or another method for colon cancer screening performed. You could save your life by having this simple procedure done. We are happy to place the referral for you and someone will call you to get you scheduled for the most convenient time and date. Please feel free to respond to the mychart message or call [phone] to request a referral. If you have already had this done, please provide our office with a copy so that we can update your chart.

## 2021-05-12 MED FILL — TAMOXIFEN CITRATE 20MG TABS: 20 mg | 90 days supply | Qty: 90 | Fill #2 | Status: AC

## 2021-05-13 ENCOUNTER — Ambulatory Visit: Admit: 2021-05-13 | Discharge: 2021-05-13 | Payer: BLUE CROSS/BLUE SHIELD | Attending: Family | Primary: Family Medicine

## 2021-05-13 ENCOUNTER — Encounter

## 2021-05-13 ENCOUNTER — Inpatient Hospital Stay: Admit: 2021-05-13 | Payer: BLUE CROSS/BLUE SHIELD | Primary: Family Medicine

## 2021-05-13 DIAGNOSIS — Z170 Estrogen receptor positive status [ER+]: Secondary | ICD-10-CM

## 2021-05-13 DIAGNOSIS — C50111 Malignant neoplasm of central portion of right female breast: Secondary | ICD-10-CM

## 2021-05-13 LAB — CBC WITH AUTO DIFFERENTIAL
Absolute Eos #: 0.1 10*3/uL (ref 0.0–0.8)
Absolute Immature Granulocyte: 0 10*3/uL (ref 0.0–0.5)
Absolute Lymph #: 1.4 10*3/uL (ref 0.5–4.6)
Absolute Mono #: 0.4 10*3/uL (ref 0.1–1.3)
Basophils Absolute: 0.1 10*3/uL (ref 0.0–0.2)
Basophils: 1 % (ref 0.0–2.0)
Eosinophils %: 1 % (ref 0.5–7.8)
Hematocrit: 39.3 % (ref 35.8–46.3)
Hemoglobin: 13.4 g/dL (ref 11.7–15.4)
Immature Granulocytes: 0 % (ref 0.0–5.0)
Lymphocytes: 19 % (ref 13–44)
MCH: 29.9 PG (ref 26.1–32.9)
MCHC: 34.1 g/dL (ref 31.4–35.0)
MCV: 87.7 FL (ref 82.0–102.0)
MPV: 10.4 FL (ref 9.4–12.3)
Monocytes: 5 % (ref 4.0–12.0)
Platelets: 278 10*3/uL (ref 150–450)
RBC: 4.48 M/uL (ref 4.05–5.2)
RDW: 13 % (ref 11.9–14.6)
Seg Neutrophils: 74 % (ref 43–78)
Segs Absolute: 5.4 10*3/uL (ref 1.7–8.2)
WBC: 7.4 10*3/uL (ref 4.3–11.1)
nRBC: 0 10*3/uL (ref 0.0–0.2)

## 2021-05-13 LAB — COMPREHENSIVE METABOLIC PANEL
ALT: 21 U/L (ref 12–65)
AST: 10 U/L — ABNORMAL LOW (ref 15–37)
Albumin/Globulin Ratio: 1.1 (ref 0.4–1.6)
Albumin: 3.8 g/dL (ref 3.5–5.0)
Alk Phosphatase: 61 U/L (ref 50–136)
Anion Gap: 10 mmol/L (ref 2–11)
BUN: 14 MG/DL (ref 6–23)
CO2: 24 mmol/L (ref 21–32)
Calcium: 8.9 MG/DL (ref 8.3–10.4)
Chloride: 107 mmol/L (ref 101–110)
Creatinine: 1 MG/DL (ref 0.6–1.0)
Est, Glom Filt Rate: >60 mL/min/{1.73_m2} (ref >60)
Globulin: 3.5 g/dL (ref 2.8–4.5)
Glucose: 130 mg/dL — ABNORMAL HIGH (ref 65–100)
Potassium: 4 mmol/L (ref 3.5–5.1)
Sodium: 141 mmol/L (ref 133–143)
Total Bilirubin: 0.5 MG/DL (ref 0.2–1.1)
Total Protein: 7.3 g/dL (ref 6.3–8.2)

## 2021-05-13 NOTE — Progress Notes (Signed)
[facility]: Office Visit Established Patient    Chief Complaint:    Chief Complaint   Patient presents with    Follow-up         History of Present Illness:  [name] is a 55 y.o. female who returns today for management of breast cancer.  She underwent routine mammogram in September 2019 which showed an irregular mass density in the upper outer right breast.  She underwent ultrasound and biopsy which showed invasive ductal carcinoma, grade 2, ER 95%, PR 95%, HER2 negative.  MRI showed the lesion to  be 1.5 cm with no other abnormalities.  She was referred to Dr. Domenica Fail and underwent lumpectomy and sentinel node biopsy on 05/04/18 which showed 1.6 cm of tumor with negative margins and 1 sentinel lymph node negative for tumor.  Oncotype Dx testing  was low risk at 6; therefore, chemotherapy can be safely deferred.  We will refer her for radiation therapy, then begin endocrine therapy.  She has completed hysterectomy but her ovaries are intact,  her FSH/LH/E2 levels were borderline so we felt it would be prudent to proceed with tamoxifen for 2-3 years, then transition to AI once she is clearly post-menopausal.       She is here today for routine 6 month follow up on Tamoxifen.  Since last seen she has been well and continues to tolerate treatment well.  There are no GI or bowel complaints.  Appetite is good and weight is stable.  Energy level is good as well.  There is no chest pain, shortness of breath, or edema.  She denies any arthralgias, however does experience muscle aches in her legs that she attributes to the combo of tamoxifen and lovastatin.  Night sweats are ongoing.  She denies any breast changes or concerns, continues with intermittent eczema of her right breat that resolved with Aquaphor.      Review of Systems:  Constitutional: Negative.   HENT: Negative.   Eyes: Negative.   Respiratory: Negative.   Cardiovascular: Negative.   Gastrointestinal: Negative.   Genitourinary:  Negative.   Musculoskeletal: Positive for myalgias of legs   Skin: Negative.   Neurological: Negative.   Endo/Heme/Allergies: Negative.   Psychiatric/Behavioral: Negative.   All other systems reviewed and are negative.       Allergies   Allergen Reactions    Codeine Other (See Comments)     Past Medical History:   Diagnosis Date    Breast cancer ([place])     T1cN0 right breast cancer    GERD (gastroesophageal reflux disease)     OTC meds as needed     High cholesterol     History of therapeutic radiation     Nausea & vomiting     Thyroid disease 06/14/2011    Hypothyroidism      Past Surgical History:   Procedure Laterality Date    BREAST BIOPSY Right 05/04/2018    RIGHT BREAST NEEDLE LOCALIZED BIOPSY performed by Lorenda Cahill, MD at [place] LUMPECTOMY Right 05/04/2018    RIGHT BREAST LUMPECTOMY performed by Lorenda Cahill, MD at [facility]      hysterectomy partial-still has ovaries    HYSTERECTOMY (CERVIX STATUS UNKNOWN)      US GUIDED NEEDLE LOC OF RIGHT BREAST Right 05/04/2018    US GUIDED NEEDLE LOC OF RIGHT BREAST 05/04/2018 [facility]     Family History   Problem  Relation Age of Onset    Heart Failure Mother     Cancer Father         throat cancer, skin cancer     Diabetes Father     Heart Attack Father         54's, now late 59's    Heart Attack Mother         late 2's, currently in her 28's     Social History     Socioeconomic History    Marital status: Married     Spouse name: Not on file    Number of children: Not on file    Years of education: Not on file    Highest education level: Not on file   Occupational History    Not on file   Tobacco Use    Smoking status: Never    Smokeless tobacco: Never   Substance and Sexual Activity    Alcohol use: Yes    Drug use: Not Currently    Sexual activity: Not on file   Other Topics Concern    Not on file   Social History Narrative    Not on file     Social Determinants of Health     Financial Resource Strain: Low Risk      Difficulty of Paying Living Expenses: Not hard at all   Food Insecurity: No Food Insecurity    Worried About Charity fundraiser in the Last Year: Never true    Ran Out of Food in the Last Year: Never true   Transportation Needs: Not on file   Physical Activity: Not on file   Stress: Not on file   Social Connections: Not on file   Intimate Partner Violence: Not on file   Housing Stability: Not on file     Current Outpatient Medications   Medication Sig Dispense Refill    levothyroxine (SYNTHROID) 75 MCG tablet Take 75 mcg by mouth every morning (before breakfast)      lovastatin (MEVACOR) 10 MG tablet Take 10 mg by mouth daily      tamoxifen (NOLVADEX) 20 MG tablet Take 20 mg by mouth daily      azithromycin (ZITHROMAX) 250 MG tablet Take 2 Tablets with food by mouth first day, then 1 tab with food by mouth daily for days 2 through 5. (Patient not taking: Reported on 05/13/2021) 6 tablet 0     No current facility-administered medications for this visit.       OBJECTIVE:  BP 139/89 Comment: sitting   Pulse 82    Temp 98.9 ??F (37.2 ??C) (Oral)    Resp 20    Ht 5' 3"  (1.6 m)    Wt 145 lb (65.8 kg)    SpO2 98%    BMI 25.69 kg/m??   Pain Score:   0 - No pain (fatigue-0)    ECOG: 0    Physical Exam:  Constitutional: Well developed, well nourished female in no acute distress, sitting comfortably in the exam room chair.    HEENT: Normocephalic and atraumatic. Sclerae anicteric. Neck supple without JVD. No thyromegaly present.    Lymph node   No palpable submandibular, cervical, supraclavicular, or axillary lymph nodes.   Skin Warm and dry.  No bruising and no rash noted.  No erythema.  No pallor.    Respiratory Lungs are clear to auscultation bilaterally without wheezes, rales or rhonchi, normal air exchange without accessory muscle use.    CVS Normal rate, regular  rhythm and normal S1 and S2.  No murmurs, gallops, or rubs.   Neuro Grossly nonfocal with no obvious sensory or motor deficits.   MSK Normal range of motion in  general.  No edema and no tenderness.   Psych Appropriate mood and affect.      Labs:  Recent Results (from the past 96 hour(s))   CBC with Auto Differential    Collection Time: 05/13/21  2:41 PM   Result Value Ref Range    WBC 7.4 4.3 - 11.1 K/uL    RBC 4.48 4.05 - 5.2 M/uL    Hemoglobin 13.4 11.7 - 15.4 g/dL    Hematocrit 39.3 35.8 - 46.3 %    MCV 87.7 82.0 - 102.0 FL    MCH 29.9 26.1 - 32.9 PG    MCHC 34.1 31.4 - 35.0 g/dL    RDW 13.0 11.9 - 14.6 %    Platelets 278 150 - 450 K/uL    MPV 10.4 9.4 - 12.3 FL    nRBC 0.00 0.0 - 0.2 K/uL    Seg Neutrophils 74 43 - 78 %    Lymphocytes 19 13 - 44 %    Monocytes 5 4.0 - 12.0 %    Eosinophils % 1 0.5 - 7.8 %    Basophils 1 0.0 - 2.0 %    Immature Granulocytes 0 0.0 - 5.0 %    Segs Absolute 5.4 1.7 - 8.2 K/UL    Absolute Lymph # 1.4 0.5 - 4.6 K/UL    Absolute Mono # 0.4 0.1 - 1.3 K/UL    Absolute Eos # 0.1 0.0 - 0.8 K/UL    Basophils Absolute 0.1 0.0 - 0.2 K/UL    Absolute Immature Granulocyte 0.0 0.0 - 0.5 K/UL    Differential Type AUTOMATED     Comprehensive Metabolic Panel    Collection Time: 05/13/21  2:41 PM   Result Value Ref Range    Sodium 141 133 - 143 mmol/L    Potassium 4.0 3.5 - 5.1 mmol/L    Chloride 107 101 - 110 mmol/L    CO2 24 21 - 32 mmol/L    Anion Gap 10 2 - 11 mmol/L    Glucose 130 (H) 65 - 100 mg/dL    BUN 14 6 - 23 MG/DL    Creatinine 1.00 0.6 - 1.0 MG/DL    Est, Glom Filt Rate >60 >60 ml/min/1.64m    Calcium 8.9 8.3 - 10.4 MG/DL    Total Bilirubin 0.5 0.2 - 1.1 MG/DL    ALT 21 12 - 65 U/L    AST 10 (L) 15 - 37 U/L    Alk Phosphatase 61 50 - 136 U/L    Total Protein 7.3 6.3 - 8.2 g/dL    Albumin 3.8 3.5 - 5.0 g/dL    Globulin 3.5 2.8 - 4.5 g/dL    Albumin/Globulin Ratio 1.1 0.4 - 1.6         Imaging:  MRI BREAST BILATERAL W WO CONTRAST    Result Date: 11/10/2020  1.  No evidence of tumor recurrence or developing mass. 2.  Small area of skin enhancement in the lateral right breast, probably reactive.  Suggest clinical correlation. BI-RADS Assessment  Category 2: Benign finding.         ASSESSMENT:   Diagnosis Orders   1. Malignant neoplasm of central portion of right breast in female, estrogen receptor positive ([facility]        2. Long-term current use of tamoxifen  3. Hot flashes due to tamoxifen              PLAN:  Lab studies were personally reviewed.    Breast cancer: 1.6 cm, grade 2 IDC, sentinel lymph node biopsy negative, ER 95%, PR 95%, HER2 negative.  S/p lumpectomy with negative margins.  She has completed definitive surgery.  Because of  the ER/PR positivity, the negative nodal assessment, and the T1 tumor, she would be an excellent candidate for Oncotype Dx for risk stratification, which was low risk at 6.  Therefore, chemotherapy can be safely deferred.  We will refer her for radiation  therapy, then begin endocrine therapy.  She has completed hysterectomy but her ovaries are intact, her FSH/LH/E2 levels were borderline so we felt it would be prudent to proceed with tamoxifen for 2-3 years, then transition to AI once she is clearly post-menopausal.   We discussed side effects of tamoxifen including the risk of VTE, as well as vasomotor symptoms and arthralgias.  She started tamoxifen after radiation was completed.       She is here today for routine 6 month follow up on Tamoxifen.  Since last seen she has been well and continues to tolerate treatment well.  There are no GI or bowel complaints.  Appetite is good and weight is stable.  Energy level is good as well.  There is no chest pain, shortness of breath, or edema.  She denies any arthralgias, however does experience muscle aches in her legs that she attributes to the combo of tamoxifen and lovastatin.  Night sweats are ongoing.  She denies any breast changes or concerns, continues with intermittent eczema of her right breat that resolved with Aquaphor.  Labs reviewed and unremarkable.  She will continue on Tamoxifen daily as planned and we will recheck her hormone levels at her next visit to  determine if she can switch to AI.  DEXA in June 2022 was normal.  Annual mammogram in October was benign, repeat October 2023.  She desires to continue with annual MRI as well, ordered for May 2023.  All questions were asked and answered to the best of my ability.  F/u in 6 months for recheck.            Charna Elizabeth, APRN - [facility]  [address] [place] [address]  Office : [phone]  Fax : [phone]

## 2021-06-29 ENCOUNTER — Encounter

## 2021-07-07 MED FILL — TAMOXIFEN CITRATE 20MG TABS: 20 mg | 90 days supply | Qty: 90 | Fill #0 | Status: AC

## 2021-07-19 ENCOUNTER — Encounter

## 2021-07-19 MED ORDER — GOLYTELY 236 G PO SOLR
236 g | Freq: Once | ORAL | 0 refills | Status: AC
Start: 2021-07-19 — End: 2021-07-19

## 2021-07-30 NOTE — Other (Signed)
Patient verified name, DOB, and procedure.    Type: 1a; abbreviated assessment per anesthesia guidelines    Labs per anesthesia: None    Instructed pt that they will be notified the day before their procedure by the GI Lab for time of arrival if their procedure is [facility] and Pre-op for [place] cases. Arrival times should be called by 5 pm. If no phone is received the patient should contact their respective hospital. The GI lab telephone number is [phone] and ES Pre-op is [phone].     Follow diet and prep instructions per office including NPO status.  If patient has NOT received instructions from office patient is advised to call surgeon office, verbalizes understanding.    Bath or shower the night before and the am of surgery with non-moisturizing soap. No lotions, oils, powders, cologne on skin. No make up, eye make up or jewelry. Wear loose fitting comfortable, clean clothing.     Must have adult present in building the entire time .     Medications for the day of procedure Tamoxifen, levothyroxine, patient to hold vitamins, supplements, herbals 7 days prior to procedure and NSAIDS/ASA 5 days prior to procedure per anesthesia guidelines.     The following discharge instructions reviewed with patient: medication given during procedure may cause drowsiness for several hours, therefore, do not drive or operate machinery for remainder of the day. You may not drink alcohol on the day of your procedure, please resume regular diet and activity unless otherwise directed. You may experience abdominal distention for several hours that is relieved by the passage of gas. Contact your physician if you have any of the following: fever or chills, severe abdominal pain or excessive amount of bleeding or a large amount when having a bowel movement. Occasional specks of blood with bowel movement would not be unusual.

## 2021-08-05 NOTE — Other (Signed)
Patient verified name, DOB, and procedure.    Type: 1a; abbreviated assessment per anesthesia guidelines    Labs per anesthesia: none    Instructed pt that they will be notified the day before their procedure by the GI Lab for time of arrival if their procedure is [facility] and Pre-op for [place] cases. Arrival times should be called by 5 pm. If no phone is received the patient should contact their respective hospital. The GI lab telephone number is [phone] and ES Pre-op is [phone].     Follow diet and prep instructions per office including NPO status.  If patient has NOT received instructions from office patient is advised to call surgeon office, verbalizes understanding.    Bath or shower the night before and the am of surgery with non-moisturizing soap. No lotions, oils, powders, cologne on skin. No make up, eye make up or jewelry. Wear loose fitting comfortable, clean clothing.     Must have adult present in building the entire time .     Medications for the day of procedure Tamoxifen (Nolvadex), Levothyroxine (Synthroid) patient to hold none per anesthesia guidelines.     The following discharge instructions reviewed with patient: medication given during procedure may cause drowsiness for several hours, therefore, do not drive or operate machinery for remainder of the day. You may not drink alcohol on the day of your procedure, please resume regular diet and activity unless otherwise directed. You may experience abdominal distention for several hours that is relieved by the passage of gas. Contact your physician if you have any of the following: fever or chills, severe abdominal pain or excessive amount of bleeding or a large amount when having a bowel movement. Occasional specks of blood with bowel movement would not be unusual.

## 2021-08-06 NOTE — Telephone Encounter (Signed)
Spoke with patient to confirm procedure for 08/09/2021 and let ptient know change of location to [facility]

## 2021-08-06 NOTE — Progress Notes (Signed)
Spoke with patient.  She was advised to arrive at 0620 for her 0755 procedure.  She was advised of our driver policy and to register in the lobby prior to coming to the GI lab.  Patient verbalized understanding.

## 2021-08-09 ENCOUNTER — Inpatient Hospital Stay: Payer: BLUE CROSS/BLUE SHIELD

## 2021-08-09 MED ORDER — LACTATED RINGERS IV SOLN
INTRAVENOUS | Status: DC
Start: 2021-08-09 — End: 2021-08-09
  Administered 2021-08-09: 12:00:00 via INTRAVENOUS

## 2021-08-09 MED ORDER — SODIUM CHLORIDE 0.9 % IV SOLN
0.9 % | INTRAVENOUS | Status: DC | PRN
Start: 2021-08-09 — End: 2021-08-09

## 2021-08-09 MED ORDER — NORMAL SALINE FLUSH 0.9 % IV SOLN
0.9 % | Freq: Two times a day (BID) | INTRAVENOUS | Status: DC
Start: 2021-08-09 — End: 2021-08-09

## 2021-08-09 MED ORDER — PROPOFOL 200 MG/20ML IV EMUL
200 MG/20ML | INTRAVENOUS | Status: DC | PRN
Start: 2021-08-09 — End: 2021-08-09
  Administered 2021-08-09: 12:00:00 160 via INTRAVENOUS
  Administered 2021-08-09: 12:00:00 50 via INTRAVENOUS

## 2021-08-09 MED ORDER — NORMAL SALINE FLUSH 0.9 % IV SOLN
0.9 % | INTRAVENOUS | Status: DC | PRN
Start: 2021-08-09 — End: 2021-08-09

## 2021-08-09 MED ORDER — LIDOCAINE HCL 1 % IJ SOLN
1 % | Freq: Once | INTRAMUSCULAR | Status: DC | PRN
Start: 2021-08-09 — End: 2021-08-09

## 2021-08-09 MED ORDER — LIDOCAINE HCL (PF) 2 % IJ SOLN
2 % | INTRAMUSCULAR | Status: DC | PRN
Start: 2021-08-09 — End: 2021-08-09
  Administered 2021-08-09: 12:00:00 60 via INTRAVENOUS

## 2021-08-09 NOTE — Anesthesia Post-Procedure Evaluation (Signed)
Department of Anesthesiology  Postprocedure Note    Patient: Jill Kaufman  MRN: [id number]  Birthdate: [birth year]  Date of evaluation: 08/09/2021      Procedure Summary     Date: 08/09/21 Room / Location: [facility]O 04 / [facility]    Anesthesia Start: 0719 Anesthesia Stop: 4481    Procedure: COLORECTAL CANCER SCREENING, NOT HIGH RISK Diagnosis:       Encounter for screening colonoscopy      (Encounter for screening colonoscopy [Z12.11])    Surgeons: Concepcion Living, MD Responsible Provider: Porfirio Mylar, MD    Anesthesia Type: TIVA ASA Status: 2          Anesthesia Type: TIVA    Aldrete Phase I: Aldrete Score: 10    Aldrete Phase II: Aldrete Score: 10      Anesthesia Post Evaluation    Patient location during evaluation: PACU  Patient participation: complete - patient participated  Level of consciousness: awake and alert  Airway patency: patent  Nausea: well controlled.  Complications: no  Cardiovascular status: acceptable.  Respiratory status: acceptable  Hydration status: stable

## 2021-08-09 NOTE — H&P (Signed)
Jill Kaufman (DOB:  05-13-1966) is a 56 y.o. female, new patient here for evaluation of the following chief complaint(s): Screening colonoscopy  No chief complaint on file.         ASSESSMENT/PLAN: Screening colonoscopy/colonoscopy  @[email or web address] @         Subjective   SUBJECTIVE/OBJECTIVE  Patient presenting to arrange a screening colonoscopic evaluation denied any rectal bleeding change in bowel habits.  She has a personal history of breast cancer    Past Medical History:   Diagnosis Date    Breast cancer ([place])     T1cN0 right breast cancer    GERD (gastroesophageal reflux disease)     no medication at this time (noted 07/30/21)    High cholesterol     History of therapeutic radiation     Nausea & vomiting     PONV (postoperative nausea and vomiting)     Thyroid disease 06/14/2011    Hypothyroidism        Past Surgical History:   Procedure Laterality Date    BREAST BIOPSY Right 05/04/2018    RIGHT BREAST NEEDLE LOCALIZED BIOPSY performed by Lorenda Cahill, MD at [place] LUMPECTOMY Right 05/04/2018    RIGHT BREAST LUMPECTOMY performed by Lorenda Cahill, MD at [place]      hysterectomy partial-still has ovaries    HYSTERECTOMY (CERVIX STATUS UNKNOWN)      US GUIDED NEEDLE LOC OF RIGHT BREAST Right 05/04/2018    US GUIDED NEEDLE LOC OF RIGHT BREAST 05/04/2018 [facility]             Allergies   Allergen Reactions    Codeine Nausea And Vomiting and Dizziness or Vertigo          Review of Systems   Constitutional:  Negative for appetite change.   HENT:  Negative for mouth sores and trouble swallowing.    Respiratory:  Negative for shortness of breath.    Cardiovascular:  Negative for chest pain.   Gastrointestinal:  Negative for abdominal pain, blood in stool and vomiting.   Skin:  Negative for color change.   Allergic/Immunologic: Negative for food allergies.   Neurological:  Negative for seizures and weakness.   Hematological:  Does not bruise/bleed easily.             Objective   Physical Exam  HENT:      Head: Normocephalic.      Mouth/Throat:      Mouth: Mucous membranes are moist.   Eyes:      General: No scleral icterus.  Cardiovascular:      Rate and Rhythm: Normal rate and regular rhythm.   Pulmonary:      Effort: No respiratory distress.   Abdominal:      General: There is no distension.      Tenderness: There is no abdominal tenderness. There is no rebound.   Lymphadenopathy:      Cervical: No cervical adenopathy.   Skin:     Coloration: Skin is not jaundiced.      Findings: No bruising.   Neurological:      General: No focal deficit present.      Mental Status: She is alert.            Current Facility-Administered Medications   Medication Dose Route Frequency Provider Last Rate Last Admin    lidocaine 1 % injection 1 mL  1 mL IntraDERmal Once PRN  Porfirio Mylar, MD        lactated ringers IV soln infusion   IntraVENous Continuous Porfirio Mylar, MD 125 mL/hr at 08/09/21 0708 New Bag at 08/09/21 0708    sodium chloride flush 0.9 % injection 5-40 mL  5-40 mL IntraVENous 2 times per day Porfirio Mylar, MD        sodium chloride flush 0.9 % injection 5-40 mL  5-40 mL IntraVENous PRN Porfirio Mylar, MD        0.9 % sodium chloride infusion   IntraVENous PRN Porfirio Mylar, MD           Family History   Problem Relation Age of Onset    Heart Failure Mother     Cancer Father         throat cancer, skin cancer     Diabetes Father     Heart Attack Father         62's, now late 62's    Heart Attack Mother         late 69's, currently in her 63's       No follow-ups on file.            An electronic signature was used to authenticate this note.    --Concepcion Living, MD

## 2021-08-09 NOTE — Discharge Instructions (Addendum)
Gastrointestinal Colonoscopy/Flexible Sigmoidoscopy - Lower Exam Discharge Instructions  Call Dr. Marcelene Butte at [phone] for any problems or questions.  Contact the doctor???s office for follow up appointment as directed  Medication may cause drowsiness for several hours, therefore, do not drive or operate machinery for remainder of the day.  No alcohol today.  Do not make any important decisions such signing legal paperwork.  Ordinarily, you may resume regular diet and activity after exam unless otherwise specified by your physician.  Because of air put into your colon during exam, you may experience some abdominal distension, relieved by the passage of gas, for several hours.  Contact your physician if you have any of the following:  Excessive amount of bleeding - large amount when having a bowel movement.  Occasional specks of blood with bowel movement would not be unusual.  Severe abdominal pain  Fever or Chills    Any additional instructions:   Follow up as needed  Repeat colonoscopy in 10 years        Instructions given to Jill Kaufman and other family members.

## 2021-08-09 NOTE — Progress Notes (Signed)
Pt was calm    Loving husband present    Prayer

## 2021-08-09 NOTE — Progress Notes (Signed)
Patient and husband voiced understanding of discharge and follow up instructions. Will be discharged via wheelchair

## 2021-08-09 NOTE — Anesthesia Pre-Procedure Evaluation (Signed)
Department of Anesthesiology  Preprocedure Note       Name:  Jill Kaufman   Age:  56 y.o.  DOB:  1966-02-12                                          MRN:  [id number]         Date:  08/09/2021      Surgeon: Juliann Mule):  Concepcion Living, MD    Procedure: Procedure(s):  COLORECTAL CANCER SCREENING, NOT HIGH RISK    Medications prior to admission:   Prior to Admission medications    Medication Sig Start Date End Date Taking? Authorizing Provider   levothyroxine (SYNTHROID) 75 MCG tablet Take 75 mcg by mouth every morning (before breakfast) 08/19/20   Ar Automatic Reconciliation   lovastatin (MEVACOR) 10 MG tablet Take 10 mg by mouth nightly 09/23/20   Ar Automatic Reconciliation   tamoxifen (NOLVADEX) 20 MG tablet Take 20 mg by mouth daily 11/01/19   Ar Automatic Reconciliation       Current medications:    Current Facility-Administered Medications   Medication Dose Route Frequency Provider Last Rate Last Admin   ??? lidocaine 1 % injection 1 mL  1 mL IntraDERmal Once PRN Porfirio Mylar, MD       ??? lactated ringers IV soln infusion   IntraVENous Continuous Porfirio Mylar, MD 125 mL/hr at 08/09/21 0708 New Bag at 08/09/21 0708   ??? sodium chloride flush 0.9 % injection 5-40 mL  5-40 mL IntraVENous 2 times per day Porfirio Mylar, MD       ??? sodium chloride flush 0.9 % injection 5-40 mL  5-40 mL IntraVENous PRN Porfirio Mylar, MD       ??? 0.9 % sodium chloride infusion   IntraVENous PRN Porfirio Mylar, MD           Allergies:    Allergies   Allergen Reactions   ??? Codeine Nausea And Vomiting and Dizziness or Vertigo       Problem List:    Patient Active Problem List   Diagnosis Code   ??? S/P lumpectomy of breast Z98.890   ??? Malignant neoplasm of central portion of right breast in female, estrogen receptor positive ([place]) C50.111, Z17.0   ??? Abnormal breast finding N64.59   ??? Statin intolerance Z78.9   ??? Abnormal results of liver function studies R94.5   ??? Hypothyroidism E03.9   ??? Mixed hyperlipidemia E78.2   ??? Pain in joint, lower  leg M25.569   ??? Vitamin D deficiency E55.9   ??? Encounter for screening colonoscopy Z12.11       Past Medical History:        Diagnosis Date   ??? Breast cancer ([place])     T1cN0 right breast cancer   ??? GERD (gastroesophageal reflux disease)     no medication at this time (noted 07/30/21)   ??? High cholesterol    ??? History of therapeutic radiation    ??? Nausea & vomiting    ??? PONV (postoperative nausea and vomiting)    ??? Thyroid disease 06/14/2011    Hypothyroidism        Past Surgical History:        Procedure Laterality Date   ??? BREAST BIOPSY Right 05/04/2018    RIGHT BREAST NEEDLE LOCALIZED BIOPSY performed by Lorenda Cahill, MD at [place]  OR   ??? BREAST LUMPECTOMY Right 05/04/2018    RIGHT BREAST LUMPECTOMY performed by Lorenda Cahill, MD at [place]   ??? GYN      hysterectomy partial-still has ovaries   ??? HYSTERECTOMY (CERVIX STATUS UNKNOWN)     ??? US GUIDED NEEDLE LOC OF RIGHT BREAST Right 05/04/2018    US GUIDED NEEDLE LOC OF RIGHT BREAST 05/04/2018 [facility]       Social History:    Social History     Tobacco Use   ??? Smoking status: Never   ??? Smokeless tobacco: Never   Substance Use Topics   ??? Alcohol use: Yes     Comment: ocassionally                                Counseling given: Not Answered      Vital Signs (Current):   Vitals:    08/05/21 1447 08/09/21 0703   BP:  (!) 178/81   Pulse:  72   Resp:  18   Temp:  98.3 ??F (36.8 ??C)   TempSrc:  Oral   SpO2:  98%   Weight: 150 lb (68 kg) 150 lb (68 kg)   Height: 5\' 3"  (1.6 m) 5\' 3"  (1.6 m)                                              BP Readings from Last 3 Encounters:   08/09/21 (!) 178/81   05/13/21 139/89   04/26/21 120/80       NPO Status: Time of last liquid consumption: 0500                        Time of last solid consumption: 1900                        Date of last liquid consumption: 08/09/21                        Date of last solid food consumption: 08/07/21    BMI:   Wt Readings from Last 3 Encounters:   08/09/21 150 lb (68 kg)    05/13/21 145 lb (65.8 kg)   04/26/21 148 lb 9.6 oz (67.4 kg)     Body mass index is 26.57 kg/m??.    CBC:   Lab Results   Component Value Date/Time    WBC 7.4 05/13/2021 02:41 PM    RBC 4.48 05/13/2021 02:41 PM    HGB 13.4 05/13/2021 02:41 PM    HCT 39.3 05/13/2021 02:41 PM    MCV 87.7 05/13/2021 02:41 PM    MCV 92.0 08/12/2020 09:20 AM    RDW 13.0 05/13/2021 02:41 PM    PLT 278 05/13/2021 02:41 PM       CMP:   Lab Results   Component Value Date/Time    NA 141 05/13/2021 02:41 PM    K 4.0 05/13/2021 02:41 PM    CL 107 05/13/2021 02:41 PM    CO2 24 05/13/2021 02:41 PM    BUN 14 05/13/2021 02:41 PM    CREATININE 1.00 05/13/2021 02:41 PM    GFRAA >60 02/17/2021 02:52 PM    AGRATIO 1.8 08/12/2020 11:35 AM  LABGLOM >60 05/13/2021 02:41 PM    LABGLOM 73 08/12/2020 11:35 AM    GLUCOSE 130 05/13/2021 02:41 PM    PROT 7.3 05/13/2021 02:41 PM    CALCIUM 8.9 05/13/2021 02:41 PM    BILITOT 0.5 05/13/2021 02:41 PM    ALKPHOS 61 05/13/2021 02:41 PM    ALKPHOS 67 08/12/2020 11:35 AM    AST 10 05/13/2021 02:41 PM    ALT 21 05/13/2021 02:41 PM       POC Tests: No results for input(s): POCGLU, POCNA, POCK, POCCL, POCBUN, POCHEMO, POCHCT in the last 72 hours.    Coags: No results found for: PROTIME, INR, APTT    HCG (If Applicable): No results found for: PREGTESTUR, PREGSERUM, HCG, HCGQUANT     ABGs: No results found for: PHART, PO2ART, PCO2ART, HCO3ART, BEART, O2SATART     Type & Screen (If Applicable):  No results found for: LABABO, LABRH    Drug/Infectious Status (If Applicable):  No results found for: HIV, HEPCAB    COVID-19 Screening (If Applicable):   Lab Results   Component Value Date/Time    COVID19 Detected 06/17/2020 08:56 AM    COVID19 Performed 06/17/2020 08:56 AM           Anesthesia Evaluation  Patient summary reviewed and Nursing notes reviewed   history of anesthetic complications: PONV.  Airway: Mallampati: II  TM distance: >3 FB   Neck ROM: full  Mouth opening: > = 3 FB   Dental: normal exam          Pulmonary:Negative Pulmonary ROS and normal exam                               Cardiovascular:    (+) hyperlipidemia                  Neuro/Psych:               GI/Hepatic/Renal:   (+) GERD:,           Endo/Other:    (+) hypothyroidism::., Cancer: h/o R breast cancer..    Cancer: h/o R breast cancer.               Abdominal:             Vascular:          Other Findings:           Anesthesia Plan      TIVA     ASA 2       Induction: intravenous.      Anesthetic plan and risks discussed with patient.                        Porfirio Mylar, MD   08/09/2021

## 2021-08-09 NOTE — Procedures (Signed)
Operative Report    Patient: Marki Frede MRN: [id number]      Date of Birth: [birth year]  Age: 56 y.o.  Sex: female            Indications:  screening for colon cancer @ICD10DX @     Preoperative Evaluation: The patient was evaluated prior to the procedure in the GI lab admission area, the patient ASA was recorded .  Consent was obtained from the patient with the risk of perforation bleeding and aspiration.    Anesthesia: IVA-per anesthesia    Complications: None; patient tolerated the procedure well.    EBL -insignificant      Procedure: The patient was sedated in the left lateral decubitus position.  Scope was advanced from the rectum to the cecum.  Evaluation was performed to the cecum twice.  The scope was withdrawn to the rectum, retroflexed view was performed.  The rectal exam was normal.  Preparation was good Boston score of 3/3/3;9 .    Findings:   Exam to the cecum.  2 passes performed into the right colon.  Normal cecum ascending transverse mucosa.  Mild left-sided diverticulosis.  Circumferential external hemorrhoid at the dentate line noted.  No sign of colon polyps      Postoperative Diagnosis: Normal screening colonoscopy    [id number] Colonoscopy, Flexible; diagnostic       Recommendations:   - For colon cancer screening in this average-risk patient, colonoscopy may be repeated in 10 years.      Signed By:  Concepcion Living, MD     August 09, 2021

## 2021-08-11 MED ORDER — LEVOTHYROXINE SODIUM 75 MCG PO TABS
75 MCG | ORAL_TABLET | ORAL | 3 refills | Status: AC
Start: 2021-08-11 — End: 2022-09-14

## 2021-08-18 ENCOUNTER — Encounter: Payer: BLUE CROSS/BLUE SHIELD | Primary: Family Medicine

## 2021-08-18 LAB — AMB POC KTY COMPLETE CBC
Granulocytes %, POC: 64.2 %
Granulocytes Abs: 4.6 10*3/uL
Hematocrit, POC: 41.8 %
Hemoglobin, POC: 13.8 G/DL
Lymphocyte %: 29.4 %
Lymphs Abs: 2.1 10*3/uL
MCHC: 33
MCV: 92.9
MPV POC: 7.8 fL
Monocyte %: 6.4 %
Monocyte Absolute, POC: 0.5 10*3/uL
Platelet Count, POC: 333 10*3/uL
RBC, POC: 4.5 M/uL
RDW, POC: 12.7 %
WBC, POC: 7.2 10*3/uL

## 2021-08-19 LAB — COMPREHENSIVE METABOLIC PANEL
ALT: 21 U/L (ref 12–65)
AST: 16 U/L (ref 15–37)
Albumin/Globulin Ratio: 1.2 (ref 0.4–1.6)
Albumin: 3.8 g/dL (ref 3.5–5.0)
Alk Phosphatase: 52 U/L (ref 50–136)
Anion Gap: 18 mmol/L — ABNORMAL HIGH (ref 2–11)
BUN: 21 MG/DL (ref 6–23)
CO2: 14 mmol/L — ABNORMAL LOW (ref 21–32)
Calcium: 9.1 MG/DL (ref 8.3–10.4)
Chloride: 107 mmol/L (ref 101–110)
Creatinine: 1 MG/DL (ref 0.6–1.0)
Est, Glom Filt Rate: >60 mL/min/{1.73_m2} (ref >60)
Globulin: 3.3 g/dL (ref 2.8–4.5)
Glucose: 98 mg/dL (ref 65–100)
Potassium: 4.7 mmol/L (ref 3.5–5.1)
Sodium: 139 mmol/L (ref 133–143)
Total Bilirubin: 0.6 MG/DL (ref 0.2–1.1)
Total Protein: 7.1 g/dL (ref 6.3–8.2)

## 2021-08-19 LAB — LIPID PANEL
Chol/HDL Ratio: 4.4
Cholesterol, Total: 218 MG/DL — ABNORMAL HIGH (ref <200)
HDL: 49 MG/DL (ref 40–60)
LDL Calculated: 133 MG/DL — ABNORMAL HIGH (ref <100)
Triglycerides: 180 MG/DL — ABNORMAL HIGH (ref 35–150)
VLDL Cholesterol Calculated: 36 MG/DL — ABNORMAL HIGH (ref 6.0–23.0)

## 2021-08-19 LAB — TSH: TSH, 3RD GENERATION: 1.82 u[IU]/mL (ref 0.358–3.740)

## 2021-08-19 LAB — HEPATITIS C ANTIBODY: Hepatitis C Ab: NONREACTIVE

## 2021-08-25 MED ORDER — LOVASTATIN 10 MG PO TABS
10 MG | ORAL_TABLET | ORAL | 2 refills | Status: AC
Start: 2021-08-25 — End: 2022-06-02

## 2021-09-01 ENCOUNTER — Ambulatory Visit
Admit: 2021-09-01 | Discharge: 2021-09-01 | Payer: BLUE CROSS/BLUE SHIELD | Attending: Family Medicine | Primary: Family Medicine

## 2021-09-01 DIAGNOSIS — Z0000 Encounter for general adult medical examination without abnormal findings: Secondary | ICD-10-CM

## 2021-09-01 NOTE — Progress Notes (Signed)
SUBJECTIVE:   Jill Kaufman is a 56 y.o. female who has a past medical history significant for hypothyroidism, high cholesterol, high triglycerides and breast cancer.  Presents today for CPX.Review of systems reveals no complaints of chest pain, shortness of breath, orthopnea or PND.  GI and GU review of systems is unremarkable.  Current medications are listed in the EMR and reviewed today.        HPI  See above    Past Medical History, Past Surgical History, Family history, Social History, and Medications were all reviewed with the patient today and updated as necessary.       Current Outpatient Medications   Medication Sig Dispense Refill    lovastatin (MEVACOR) 10 MG tablet TAKE 1 TABLET BY MOUTH DAILY 100 tablet 2    levothyroxine (SYNTHROID) 75 MCG tablet TAKE 1 TABLET BY MOUTH DAILY BEFORE BREAKFAST 100 tablet 3    tamoxifen (NOLVADEX) 20 MG tablet Take 20 mg by mouth daily       No current facility-administered medications for this visit.     Allergies   Allergen Reactions    Codeine Nausea And Vomiting and Dizziness or Vertigo     Patient Active Problem List   Diagnosis    S/P lumpectomy of breast    Malignant neoplasm of central portion of right breast in female, estrogen receptor positive ([facility])    Abnormal breast finding    Statin intolerance    Abnormal results of liver function studies    Hypothyroidism    Mixed hyperlipidemia    Pain in joint, lower leg    Vitamin D deficiency     Past Medical History:   Diagnosis Date    Breast cancer ([place])     T1cN0 right breast cancer    GERD (gastroesophageal reflux disease)     no medication at this time (noted 07/30/21)    High cholesterol     History of therapeutic radiation     Nausea & vomiting     PONV (postoperative nausea and vomiting)     Thyroid disease 06/14/2011    Hypothyroidism      Past Surgical History:   Procedure Laterality Date    BREAST BIOPSY Right 05/04/2018    RIGHT BREAST NEEDLE LOCALIZED BIOPSY performed by Lorenda Cahill, MD at  [facility] [place] LUMPECTOMY Right 05/04/2018    RIGHT BREAST LUMPECTOMY performed by Lorenda Cahill, MD at [facility]    COLONOSCOPY N/A 08/09/2021    COLORECTAL CANCER SCREENING, NOT HIGH RISK performed by Concepcion Living, MD at [place]      hysterectomy partial-still has ovaries    HYSTERECTOMY (CERVIX STATUS UNKNOWN)      US GUIDED NEEDLE LOC OF RIGHT BREAST Right 05/04/2018    US GUIDED NEEDLE LOC OF RIGHT BREAST 05/04/2018 [facility]     Family History   Problem Relation Age of Onset    Heart Failure Mother     Cancer Father         throat cancer, skin cancer     Diabetes Father     Heart Attack Father         79's, now late 31's    Heart Attack Mother         late 58's, currently in her 45's     Social History     Tobacco Use    Smoking status: Never    Smokeless tobacco:  Never   Substance Use Topics    Alcohol use: Yes     Comment: ocassionally         Review of Systems  See above    OBJECTIVE:  BP 122/76    Ht '5\' 3"'$  (1.6 m)    Wt 146 lb 9.6 oz (66.5 kg)    BMI 25.97 kg/m??      Physical Exam  Constitutional:       General: She is not in acute distress.     Appearance: Normal appearance. She is not ill-appearing.   HENT:      Head: Normocephalic and atraumatic.      Right Ear: Ear canal and external ear normal. There is no impacted cerumen.      Left Ear: Tympanic membrane, ear canal and external ear normal. There is no impacted cerumen.      Nose: Nose normal.      Mouth/Throat:      Mouth: Mucous membranes are moist.      Pharynx: Oropharynx is clear. No oropharyngeal exudate or posterior oropharyngeal erythema.   Eyes:      General: No scleral icterus.     Extraocular Movements: Extraocular movements intact.      Conjunctiva/sclera: Conjunctivae normal.      Pupils: Pupils are equal, round, and reactive to light.   Neck:      Vascular: No carotid bruit.   Cardiovascular:      Rate and Rhythm: Normal rate and regular rhythm.      Pulses: Normal pulses.      Heart sounds: Normal  heart sounds. No murmur heard.  Pulmonary:      Effort: Pulmonary effort is normal. No respiratory distress.      Breath sounds: Normal breath sounds. No wheezing.   Abdominal:      General: Abdomen is flat. Bowel sounds are normal. There is no distension.      Palpations: Abdomen is soft. There is no mass.      Tenderness: There is no abdominal tenderness.      Hernia: No hernia is present.   Musculoskeletal:         General: No swelling, tenderness or deformity. Normal range of motion.      Cervical back: Normal range of motion and neck supple.      Right lower leg: No edema.      Left lower leg: No edema.   Lymphadenopathy:      Cervical: No cervical adenopathy.   Skin:     General: Skin is warm.      Findings: No rash.   Neurological:      General: No focal deficit present.      Mental Status: She is alert and oriented to person, place, and time.      Cranial Nerves: No cranial nerve deficit.      Motor: No weakness.      Deep Tendon Reflexes: Reflexes normal.   Psychiatric:         Mood and Affect: Mood normal.         Behavior: Behavior normal.         Thought Content: Thought content normal.         Judgment: Judgment normal.       Medical problems and test results were reviewed with the patient today.         ASSESSMENT and PLAN    1.  CPX.  Anticipatory guidance discussed including the importance of sunscreen use,  helmet use and seatbelt use.  Screening colonoscopy and mammography are up-to-date.  Blood pressure 122/76.    2.  High cholesterol.  LDL 133.  Previously 108.  Continue Mevacor.  Liver enzymes remain normal.    3.  High triglycerides.  Triglycerides are 180.  Previously 333.  Dietary focus.    4.  Hypothyroidism.  TSH 1.8.  Continue current dose of Synthroid.    5.  History of breast cancer.  Surveillance up-to-date.    Elements of this note have been dictated using speech recognition software. As a result, errors of speech recognition may have occurred.

## 2021-09-07 ENCOUNTER — Encounter: Attending: Medical | Primary: Family Medicine

## 2021-09-17 NOTE — Telephone Encounter (Signed)
Call from [facility] and they want a script sent with 90 fill for tamoxifen. Call to the patient to confirm. Last script went to [facility].   LVM.

## 2021-09-21 NOTE — Telephone Encounter (Signed)
Call to the patient and LVM.

## 2021-09-22 MED ORDER — TAMOXIFEN CITRATE 20 MG PO TABS
20 MG | ORAL_TABLET | Freq: Every day | ORAL | 3 refills | Status: AC
Start: 2021-09-22 — End: 2022-09-27
  Filled 2021-11-04: qty 90, 90d supply, fill #0

## 2021-09-22 NOTE — Telephone Encounter (Signed)
No return call from he patient so script sent to [facility] due to them saying her insurance requires it to go to them.

## 2021-09-22 NOTE — Telephone Encounter (Signed)
[facility] calling on the behalf of the PT due to  a refill on her medication tamoxifen 20 Mg.    Ph[facility]

## 2021-10-27 ENCOUNTER — Inpatient Hospital Stay: Admit: 2021-10-27 | Payer: BLUE CROSS/BLUE SHIELD | Primary: Family Medicine

## 2021-10-27 DIAGNOSIS — C50111 Malignant neoplasm of central portion of right female breast: Secondary | ICD-10-CM

## 2021-10-27 DIAGNOSIS — Z170 Estrogen receptor positive status [ER+]: Secondary | ICD-10-CM

## 2021-10-27 MED ORDER — SODIUM CHLORIDE FLUSH 0.9 % IV SOLN
0.9 % | INTRAVENOUS | Status: AC | PRN
Start: 2021-10-27 — End: 2021-10-31
  Administered 2021-10-27: 16:00:00 30 mL via INTRAVENOUS

## 2021-10-27 MED ORDER — GADOTERIDOL 279.3 MG/ML IV SOLN
279.3 MG/ML | Freq: Once | INTRAVENOUS | Status: AC | PRN
Start: 2021-10-27 — End: 2021-10-27
  Administered 2021-10-27: 16:00:00 13 mL via INTRAVENOUS

## 2021-10-27 MED FILL — PROHANCE 279.3 MG/ML IV SOLN: 279.3 MG/ML | INTRAVENOUS | Qty: 15

## 2021-11-04 MED FILL — TAMOXIFEN CITRATE 20MG TABS: 20 MG | 90 days supply | Qty: 90 | Fill #0 | Status: AC

## 2021-11-10 ENCOUNTER — Encounter

## 2021-11-12 ENCOUNTER — Ambulatory Visit
Admit: 2021-11-12 | Discharge: 2021-11-12 | Payer: BLUE CROSS/BLUE SHIELD | Attending: Internal Medicine | Primary: Family Medicine

## 2021-11-12 ENCOUNTER — Inpatient Hospital Stay: Admit: 2021-11-12 | Payer: BLUE CROSS/BLUE SHIELD | Primary: Family Medicine

## 2021-11-12 DIAGNOSIS — Z170 Estrogen receptor positive status [ER+]: Secondary | ICD-10-CM

## 2021-11-12 DIAGNOSIS — C50111 Malignant neoplasm of central portion of right female breast: Secondary | ICD-10-CM

## 2021-11-12 LAB — CBC WITH AUTO DIFFERENTIAL
Absolute Immature Granulocyte: 0 10*3/uL (ref 0.0–0.5)
Basophils %: 1 % (ref 0.0–2.0)
Basophils Absolute: 0.1 10*3/uL (ref 0.0–0.2)
Eosinophils %: 1 % (ref 0.5–7.8)
Eosinophils Absolute: 0.1 10*3/uL (ref 0.0–0.8)
Hematocrit: 36.6 % (ref 35.8–46.3)
Hemoglobin: 12.6 g/dL (ref 11.7–15.4)
Immature Granulocytes: 0 % (ref 0.0–5.0)
Lymphocytes %: 21 % (ref 13–44)
Lymphocytes Absolute: 1.5 10*3/uL (ref 0.5–4.6)
MCH: 29.7 PG (ref 26.1–32.9)
MCHC: 34.4 g/dL (ref 31.4–35.0)
MCV: 86.3 FL (ref 82.0–102.0)
MPV: 10.4 FL (ref 9.4–12.3)
Monocytes %: 5 % (ref 4.0–12.0)
Monocytes Absolute: 0.3 10*3/uL (ref 0.1–1.3)
Neutrophils %: 72 % (ref 43–78)
Neutrophils Absolute: 5.1 10*3/uL (ref 1.7–8.2)
Platelets: 253 10*3/uL (ref 150–450)
RBC: 4.24 M/uL (ref 4.05–5.2)
RDW: 12.1 % (ref 11.9–14.6)
WBC: 7.1 10*3/uL (ref 4.3–11.1)
nRBC: 0 10*3/uL (ref 0.0–0.2)

## 2021-11-12 LAB — COMPREHENSIVE METABOLIC PANEL
ALT: 26 U/L (ref 12–65)
AST: 16 U/L (ref 15–37)
Albumin/Globulin Ratio: 1 (ref 0.4–1.6)
Albumin: 3.6 g/dL (ref 3.5–5.0)
Alk Phosphatase: 63 U/L (ref 50–136)
Anion Gap: 7 mmol/L (ref 2–11)
BUN: 17 MG/DL (ref 6–23)
CO2: 25 mmol/L (ref 21–32)
Calcium: 9 MG/DL (ref 8.3–10.4)
Chloride: 110 mmol/L (ref 101–110)
Creatinine: 1.1 MG/DL — ABNORMAL HIGH (ref 0.6–1.0)
Est, Glom Filt Rate: 59 mL/min/{1.73_m2} — ABNORMAL LOW (ref >60)
Globulin: 3.5 g/dL (ref 2.8–4.5)
Glucose: 117 mg/dL — ABNORMAL HIGH (ref 65–100)
Potassium: 3.5 mmol/L (ref 3.5–5.1)
Sodium: 142 mmol/L (ref 133–143)
Total Bilirubin: 0.4 MG/DL (ref 0.2–1.1)
Total Protein: 7.1 g/dL (ref 6.3–8.2)

## 2021-11-12 LAB — FOLLICLE STIMULATING HORMONE: FSH: 17.3 m[IU]/mL

## 2021-11-12 LAB — ESTRADIOL: Estradiol: 12.32 pg/mL

## 2021-11-12 LAB — LUTEINIZING HORMONE: LH: 4.7 m[IU]/mL

## 2021-11-12 NOTE — Patient Instructions (Addendum)
Patient Instructions from Today's Visit    Reason for Visit:  Follow up Breast Cancer     Diagnosis Information:  https://www.cancer.net/about-us/asco-answers-patient-education-materials/asco-answers-fact-sheets    Plan:  Lab results reviewed       Follow Up:  Follow up in 6 months with labs prior     Recent Lab Results:  Hospital Outpatient Visit on 11/12/2021   Component Date Value Ref Range Status    WBC 11/12/2021 7.1  4.3 - 11.1 K/uL Final    RBC 11/12/2021 4.24  4.05 - 5.2 M/uL Final    Hemoglobin 11/12/2021 12.6  11.7 - 15.4 g/dL Final    Hematocrit 11/12/2021 36.6  35.8 - 46.3 % Final    MCV 11/12/2021 86.3  82.0 - 102.0 FL Final    MCH 11/12/2021 29.7  26.1 - 32.9 PG Final    MCHC 11/12/2021 34.4  31.4 - 35.0 g/dL Final    RDW 11/12/2021 12.1  11.9 - 14.6 % Final    Platelets 11/12/2021 253  150 - 450 K/uL Final    MPV 11/12/2021 10.4  9.4 - 12.3 FL Final    nRBC 11/12/2021 0.00  0.0 - 0.2 K/uL Final    **Note: Absolute NRBC parameter is now reported with Hemogram**    Differential Type 11/12/2021 AUTOMATED    Final    Neutrophils % 11/12/2021 72  43 - 78 % Final    Lymphocytes % 11/12/2021 21  13 - 44 % Final    Monocytes % 11/12/2021 5  4.0 - 12.0 % Final    Eosinophils % 11/12/2021 1  0.5 - 7.8 % Final    Basophils % 11/12/2021 1  0.0 - 2.0 % Final    Immature Granulocytes 11/12/2021 0  0.0 - 5.0 % Final    Neutrophils Absolute 11/12/2021 5.1  1.7 - 8.2 K/UL Final    Lymphocytes Absolute 11/12/2021 1.5  0.5 - 4.6 K/UL Final    Monocytes Absolute 11/12/2021 0.3  0.1 - 1.3 K/UL Final    Eosinophils Absolute 11/12/2021 0.1  0.0 - 0.8 K/UL Final    Basophils Absolute 11/12/2021 0.1  0.0 - 0.2 K/UL Final    Absolute Immature Granulocyte 11/12/2021 0.0  0.0 - 0.5 K/UL Final     Treatment Summary has been discussed and given to patient: N/A    -------------------------------------------------------------------------------------------------------------------  Please call our office at [phone] if you have  any  of the following symptoms:   Fever of 100.5 or greater  Chills  Shortness of breath  Swelling or pain in one leg    After office hours an answering service is available and will contact a provider for emergencies or if you are experiencing any of the above symptoms.    Patient does express an interest in My Chart.  My Chart log in information explained on the after visit summary printout at the [facility] desk.    Edmonia James, RN

## 2021-11-12 NOTE — Progress Notes (Signed)
[facility]: Office Visit Established Patient    Chief Complaint:    Chief Complaint   Patient presents with    Follow-up         History of Present Illness:  [name] is a 56 y.o. female who returns today for management of breast cancer.  She underwent routine mammogram in September 2019 which showed an irregular mass density in the upper outer right breast.  She underwent ultrasound and biopsy which showed invasive ductal carcinoma, grade 2, ER 95%, PR 95%, HER2 negative.  MRI showed the lesion to  be 1.5 cm with no other abnormalities.  She was referred to Dr. Domenica Fail and underwent lumpectomy and sentinel node biopsy on 05/04/18 which showed 1.6 cm of tumor with negative margins and 1 sentinel lymph node negative for tumor.  Oncotype Dx testing  was low risk at 6; therefore, chemotherapy can be safely deferred.  We will refer her for radiation therapy, then begin endocrine therapy.  She has completed hysterectomy but her ovaries are intact,  her FSH/LH/E2 levels were borderline so we felt it would be prudent to proceed with tamoxifen for 2-3 years, then transition to AI once she is clearly post-menopausal.       She is here today for routine 6 month follow up on Tamoxifen.  She has now been on therapy about 3 years.  Since last seen she has been well and continues to tolerate treatment well.  There are no GI or bowel complaints.  Appetite is good and weight is stable.  Energy level is good as well.  No hot flashes.  She denies any breast changes or concerns.      Review of Systems:  Constitutional: Negative.   HENT: Negative.   Eyes: Negative.   Respiratory: Negative.   Cardiovascular: Negative.   Gastrointestinal: Negative.   Genitourinary: Negative.   Musculoskeletal: Positive for myalgias of legs   Skin: Negative.   Neurological: Negative.   Endo/Heme/Allergies: Negative.   Psychiatric/Behavioral: Negative.   All other systems reviewed and are negative.       Allergies   Allergen  Reactions    Codeine Nausea And Vomiting and Dizziness or Vertigo     Past Medical History:   Diagnosis Date    Breast cancer ([place])     T1cN0 right breast cancer    GERD (gastroesophageal reflux disease)     no medication at this time (noted 07/30/21)    High cholesterol     History of therapeutic radiation     Nausea & vomiting     PONV (postoperative nausea and vomiting)     Thyroid disease 06/14/2011    Hypothyroidism      Past Surgical History:   Procedure Laterality Date    BREAST BIOPSY Right 05/04/2018    RIGHT BREAST NEEDLE LOCALIZED BIOPSY performed by Lorenda Cahill, MD at [place] LUMPECTOMY Right 05/04/2018    RIGHT BREAST LUMPECTOMY performed by Lorenda Cahill, MD at [place]    COLONOSCOPY N/A 08/09/2021    COLORECTAL CANCER SCREENING, NOT HIGH RISK performed by Concepcion Living, MD at [place]      hysterectomy partial-still has ovaries    HYSTERECTOMY (CERVIX STATUS UNKNOWN)      US GUIDED NEEDLE LOC OF RIGHT BREAST Right 05/04/2018    US GUIDED NEEDLE LOC OF RIGHT BREAST 05/04/2018 [facility]     Family History  Problem Relation Age of Onset    Heart Failure Mother     Cancer Father         throat cancer, skin cancer     Diabetes Father     Heart Attack Father         6's, now late 61's    Heart Attack Mother         late 61's, currently in her 23's     Social History     Socioeconomic History    Marital status: Married     Spouse name: Not on file    Number of children: Not on file    Years of education: Not on file    Highest education level: Not on file   Occupational History    Not on file   Tobacco Use    Smoking status: Never    Smokeless tobacco: Never   Vaping Use    Vaping Use: Never used   Substance and Sexual Activity    Alcohol use: Yes     Comment: ocassionally    Drug use: Not Currently    Sexual activity: Not on file   Other Topics Concern    Not on file   Social History Narrative    Not on file     Social Determinants of Health     Financial  Resource Strain: Unknown    Difficulty of Paying Living Expenses: Patient refused   Food Insecurity: Unknown    Worried About [place] in the Last Year: Patient refused    [place] in the Last Year: Patient refused   Transportation Needs: Unknown    Film/video editor (Medical): Not on file    Lack of Transportation (Non-Medical): Patient refused   Physical Activity: Not on file   Stress: Not on file   Social Connections: Not on file   Intimate Partner Violence: Not on file   Housing Stability: Unknown    Unable to Pay for Housing in the Last Year: Not on file    Number of [place] in the Last Year: Not on file    Unstable Housing in the Last Year: Patient refused     Current Outpatient Medications   Medication Sig Dispense Refill    Ivermectin 1 % CREA daily      tamoxifen (NOLVADEX) 20 MG tablet Take 1 tablet by mouth daily 90 tablet 3    lovastatin (MEVACOR) 10 MG tablet TAKE 1 TABLET BY MOUTH DAILY 100 tablet 2    levothyroxine (SYNTHROID) 75 MCG tablet TAKE 1 TABLET BY MOUTH DAILY BEFORE BREAKFAST 100 tablet 3     No current facility-administered medications for this visit.       OBJECTIVE:  BP 127/70   Pulse 77   Temp 99 F (37.2 C)   Resp 14   Ht 5' 3"  (1.6 m)   Wt 147 lb 14.4 oz (67.1 kg)   SpO2 97%   BMI 26.20 kg/m   Pain Score:   0 - No pain (fatigue-0)    ECOG: 0    Physical Exam:  Constitutional: Well developed, well nourished female in no acute distress, sitting comfortably in the exam room chair.    HEENT: Normocephalic and atraumatic. Sclerae anicteric. Neck supple without JVD. No thyromegaly present.    Lymph node   No palpable submandibular, cervical, supraclavicular, or axillary lymph nodes.   Skin Warm and dry.  No bruising and no rash noted.  No  erythema.  No pallor.    Respiratory Lungs are clear to auscultation bilaterally without wheezes, rales or rhonchi, normal air exchange without accessory muscle use.    CVS Normal rate, regular rhythm and normal S1 and  S2.  No murmurs, gallops, or rubs.   Neuro Grossly nonfocal with no obvious sensory or motor deficits.   MSK Normal range of motion in general.  No edema and no tenderness.   Psych Appropriate mood and affect.      Labs:  Recent Results (from the past 96 hour(s))   CBC with Auto Differential    Collection Time: 11/12/21  2:13 PM   Result Value Ref Range    WBC 7.1 4.3 - 11.1 K/uL    RBC 4.24 4.05 - 5.2 M/uL    Hemoglobin 12.6 11.7 - 15.4 g/dL    Hematocrit 36.6 35.8 - 46.3 %    MCV 86.3 82.0 - 102.0 FL    MCH 29.7 26.1 - 32.9 PG    MCHC 34.4 31.4 - 35.0 g/dL    RDW 12.1 11.9 - 14.6 %    Platelets 253 150 - 450 K/uL    MPV 10.4 9.4 - 12.3 FL    nRBC 0.00 0.0 - 0.2 K/uL    Differential Type AUTOMATED      Neutrophils % 72 43 - 78 %    Lymphocytes % 21 13 - 44 %    Monocytes % 5 4.0 - 12.0 %    Eosinophils % 1 0.5 - 7.8 %    Basophils % 1 0.0 - 2.0 %    Immature Granulocytes 0 0.0 - 5.0 %    Neutrophils Absolute 5.1 1.7 - 8.2 K/UL    Lymphocytes Absolute 1.5 0.5 - 4.6 K/UL    Monocytes Absolute 0.3 0.1 - 1.3 K/UL    Eosinophils Absolute 0.1 0.0 - 0.8 K/UL    Basophils Absolute 0.1 0.0 - 0.2 K/UL    Absolute Immature Granulocyte 0.0 0.0 - 0.5 K/UL   Comprehensive Metabolic Panel    Collection Time: 11/12/21  2:13 PM   Result Value Ref Range    Sodium 142 133 - 143 mmol/L    Potassium 3.5 3.5 - 5.1 mmol/L    Chloride 110 101 - 110 mmol/L    CO2 25 21 - 32 mmol/L    Anion Gap 7 2 - 11 mmol/L    Glucose 117 (H) 65 - 100 mg/dL    BUN 17 6 - 23 MG/DL    Creatinine 1.10 (H) 0.6 - 1.0 MG/DL    Est, Glom Filt Rate 59 (L) >60 ml/min/1.28m    Calcium 9.0 8.3 - 10.4 MG/DL    Total Bilirubin 0.4 0.2 - 1.1 MG/DL    ALT 26 12 - 65 U/L    AST 16 15 - 37 U/L    Alk Phosphatase 63 50 - 136 U/L    Total Protein 7.1 6.3 - 8.2 g/dL    Albumin 3.6 3.5 - 5.0 g/dL    Globulin 3.5 2.8 - 4.5 g/dL    Albumin/Globulin Ratio 1.0 0.4 - 1.6         Imaging:  MRI BREAST BILATERAL W WO CONTRAST    Result Date: 10/27/2021  1. No MRI evidence of  residual or recurrent breast malignancy. 2. Stable post-treatment sequelae on the right. Annual screening mammography will be due in October 2023. She may benefit from continued supplemental MRI in the future, as appropriate. BI-RADS Assessment Category 2: Benign  finding.          MRI BREAST BILATERAL W WO CONTRAST    Result Date: 11/10/2020  1.  No evidence of tumor recurrence or developing mass. 2.  Small area of skin enhancement in the lateral right breast, probably reactive.  Suggest clinical correlation. BI-RADS Assessment Category 2: Benign finding.         ASSESSMENT:   Diagnosis Orders   1. Malignant neoplasm of central portion of right breast in female, estrogen receptor positive ([place])                PLAN:  Lab studies were personally reviewed.    Breast cancer: 1.6 cm, grade 2 IDC, sentinel lymph node biopsy negative, ER 95%, PR 95%, HER2 negative.  S/p lumpectomy with negative margins.  She has completed definitive surgery.  Because of  the ER/PR positivity, the negative nodal assessment, and the T1 tumor, she would be an excellent candidate for Oncotype Dx for risk stratification, which was low risk at 6.  Therefore, chemotherapy can be safely deferred.  We will refer her for radiation  therapy, then begin endocrine therapy.  She has completed hysterectomy but her ovaries are intact, her FSH/LH/E2 levels were borderline so we felt it would be prudent to proceed with tamoxifen for 2-3 years, then transition to AI once she is clearly post-menopausal.   We discussed side effects of tamoxifen including the risk of VTE, as well as vasomotor symptoms and arthralgias.  She started tamoxifen after radiation was completed.       She is here today for routine 6 month follow up on Tamoxifen.  She has now been on therapy about 3 years.  Since last seen she has been well and continues to tolerate treatment well.  There are no GI or bowel complaints.  Appetite is good and weight is stable.  Energy level is good as  well.  No hot flashes.  She denies any breast changes or concerns.  Labs reviewed and unremarkable.  FSH/LH/E2 are pending, she will continue on tamoxifen for now but the endocrine labs will determine if she can switch to AI.  DEXA in June 2022 was normal.  Annual mammogram in October 2022 was benign, repeat October 2023.  She desires to continue with annual MRI as well, negative in May 2023 - she did have an incidental finding of a gallstone, she will discuss with primary care but has no symptoms.  All questions were asked and answered to the best of my ability.  F/u in 6 months for recheck (unless she switches to AI, in which case we will do 3 months).            Greer Ee, MD  [facility] [place] [address]  Office : [phone]  Fax : [phone]

## 2022-02-01 MED FILL — TAMOXIFEN CITRATE 20MG TABS: 20 20 MG | 90 days supply | Qty: 90 | Fill #1 | Status: AC

## 2022-03-09 ENCOUNTER — Encounter: Admit: 2022-03-09 | Discharge: 2022-03-09 | Payer: BLUE CROSS/BLUE SHIELD | Primary: Family Medicine

## 2022-03-09 DIAGNOSIS — E034 Atrophy of thyroid (acquired): Secondary | ICD-10-CM

## 2022-03-09 LAB — COMPREHENSIVE METABOLIC PANEL
ALT: 26 U/L (ref 12–65)
AST: 16 U/L (ref 15–37)
Albumin/Globulin Ratio: 1.1 (ref 0.4–1.6)
Albumin: 3.9 g/dL (ref 3.5–5.0)
Alk Phosphatase: 61 U/L (ref 50–136)
Anion Gap: 5 mmol/L (ref 2–11)
BUN: 15 MG/DL (ref 6–23)
CO2: 27 mmol/L (ref 21–32)
Calcium: 9.3 MG/DL (ref 8.3–10.4)
Chloride: 109 mmol/L (ref 101–110)
Creatinine: 0.9 MG/DL (ref 0.6–1.0)
Est, Glom Filt Rate: >60 mL/min/{1.73_m2} (ref >60)
Globulin: 3.7 g/dL (ref 2.8–4.5)
Glucose: 94 mg/dL (ref 65–100)
Potassium: 4.2 mmol/L (ref 3.5–5.1)
Sodium: 141 mmol/L (ref 133–143)
Total Bilirubin: 0.5 MG/DL (ref 0.2–1.1)
Total Protein: 7.6 g/dL (ref 6.3–8.2)

## 2022-03-09 LAB — LIPID PANEL
Chol/HDL Ratio: 4.2
Cholesterol, Total: 211 MG/DL — ABNORMAL HIGH (ref <200)
HDL: 50 MG/DL (ref 40–60)
LDL Calculated: 118.8 MG/DL — ABNORMAL HIGH (ref <100)
Triglycerides: 211 MG/DL — ABNORMAL HIGH (ref 35–150)
VLDL Cholesterol Calculated: 42.2 MG/DL — ABNORMAL HIGH (ref 6.0–23.0)

## 2022-03-09 LAB — TSH: TSH, 3RD GENERATION: 4.01 u[IU]/mL — ABNORMAL HIGH (ref 0.358–3.740)

## 2022-03-16 ENCOUNTER — Encounter: Payer: BLUE CROSS/BLUE SHIELD | Attending: Family Medicine | Primary: Family Medicine

## 2022-03-16 ENCOUNTER — Ambulatory Visit
Admit: 2022-03-16 | Discharge: 2022-03-16 | Payer: BLUE CROSS/BLUE SHIELD | Attending: Family Medicine | Primary: Family Medicine

## 2022-03-16 DIAGNOSIS — E034 Atrophy of thyroid (acquired): Secondary | ICD-10-CM

## 2022-03-16 MED ORDER — AZITHROMYCIN 250 MG PO TABS
250 MG | ORAL_TABLET | ORAL | 0 refills | Status: DC
Start: 2022-03-16 — End: 2022-05-16

## 2022-03-16 NOTE — Progress Notes (Signed)
SUBJECTIVE:   Jill Kaufman is a 56 y.o. female who has a past medical history significant for hypothyroidism, high cholesterol, high triglycerides and breast cancer.  Review of systems reveals no complaints of chest pain, shortness of breath, orthopnea or PND.  She does report a 7-day history of facial pain, yellow-green bloody nasal drainage and head congestion.  No fever or body aches.  Mild cough reported as well.    HPI  See above    Past Medical History, Past Surgical History, Family history, Social History, and Medications were all reviewed with the patient today and updated as necessary.       Current Outpatient Medications   Medication Sig Dispense Refill    Ivermectin 1 % CREA daily      tamoxifen (NOLVADEX) 20 MG tablet Take 1 tablet by mouth daily 90 tablet 3    lovastatin (MEVACOR) 10 MG tablet TAKE 1 TABLET BY MOUTH DAILY 100 tablet 2    levothyroxine (SYNTHROID) 75 MCG tablet TAKE 1 TABLET BY MOUTH DAILY BEFORE BREAKFAST 100 tablet 3     No current facility-administered medications for this visit.     Allergies   Allergen Reactions    Codeine Nausea And Vomiting and Dizziness or Vertigo     Patient Active Problem List   Diagnosis    S/P lumpectomy of breast    Malignant neoplasm of central portion of right breast in female, estrogen receptor positive ([facility])    Abnormal breast finding    Statin intolerance    Abnormal results of liver function studies    Hypothyroidism    Mixed hyperlipidemia    Pain in joint, lower leg    Vitamin D deficiency     Past Medical History:   Diagnosis Date    Breast cancer ([place])     T1cN0 right breast cancer    GERD (gastroesophageal reflux disease)     no medication at this time (noted 07/30/21)    High cholesterol     History of therapeutic radiation     Nausea & vomiting     PONV (postoperative nausea and vomiting)     Thyroid disease 06/14/2011    Hypothyroidism      Past Surgical History:   Procedure Laterality Date    BREAST BIOPSY Right 05/04/2018    RIGHT  BREAST NEEDLE LOCALIZED BIOPSY performed by Lorenda Cahill, MD at [place] LUMPECTOMY Right 05/04/2018    RIGHT BREAST LUMPECTOMY performed by Lorenda Cahill, MD at [place]    COLONOSCOPY N/A 08/09/2021    COLORECTAL CANCER SCREENING, NOT HIGH RISK performed by Concepcion Living, MD at [place]      hysterectomy partial-still has ovaries    HYSTERECTOMY (CERVIX STATUS UNKNOWN)      US GUIDED NEEDLE LOC OF RIGHT BREAST Right 05/04/2018    US GUIDED NEEDLE LOC OF RIGHT BREAST 05/04/2018 [facility]     Family History   Problem Relation Age of Onset    Heart Failure Mother     Cancer Father         throat cancer, skin cancer     Diabetes Father     Heart Attack Father         65's, now late 90's    Heart Attack Mother         late 41's, currently in her 74's     Social History  Tobacco Use    Smoking status: Never    Smokeless tobacco: Never   Substance Use Topics    Alcohol use: Yes     Comment: ocassionally         Review of Systems  See above    OBJECTIVE:  BP 118/78   Ht '5\' 3"'$  (1.6 m)   Wt 148 lb 12.8 oz (67.5 kg)   BMI 26.36 kg/m      Physical Exam  Constitutional:       General: She is not in acute distress.     Appearance: Normal appearance. She is not ill-appearing or toxic-appearing.   HENT:      Head: Normocephalic and atraumatic.      Comments: Patient has frontal and maxillary sinus tenderness bilaterally.     Ears:      Comments: TM's are clear bilaterally with dull effusions.     Nose: Congestion and rhinorrhea present.      Mouth/Throat:      Mouth: Mucous membranes are moist.      Pharynx: Oropharynx is clear. No oropharyngeal exudate or posterior oropharyngeal erythema.   Cardiovascular:      Rate and Rhythm: Normal rate and regular rhythm.      Heart sounds: No murmur heard.  Pulmonary:      Effort: Pulmonary effort is normal.      Breath sounds: Normal breath sounds. No wheezing or rhonchi.   Musculoskeletal:      Cervical back: Normal range of motion and  neck supple. No rigidity or tenderness.   Lymphadenopathy:      Cervical: No cervical adenopathy.   Skin:     Findings: No rash.   Neurological:      Mental Status: She is alert and oriented to person, place, and time.   Psychiatric:         Mood and Affect: Mood normal.         Behavior: Behavior normal.         Medical problems and test results were reviewed with the patient today.         ASSESSMENT and PLAN    1.  Hypothyroidism.  TSH is 4.0.  Previously 1.8.  Patient acknowledges that she has not been taking his Synthroid correctly.  Discussed with the patient proper administration of thyroid medication.  She agrees to begin this process.  Recheck TSH at scheduled follow-up.  She is asymptomatic.    2.  High cholesterol.  LDL is 118.  Continue statin.  Liver enzymes remain normal.    3.  High triglycerides.  Triglycerides of 211.  Dietary focus.    4.  Facial pain.  Underlying sinusitis.    5.  Head congestion.  As above.  Over-the-counter plain Mucinex and Nasacort.    6.  Sinusitis.  Z-Pak as directed.  Return if symptoms persist or worsen.    Elements of this note have been dictated using speech recognition software. As a result, errors of speech recognition may have occurred.

## 2022-03-30 ENCOUNTER — Ambulatory Visit: Payer: BLUE CROSS/BLUE SHIELD | Primary: Family Medicine

## 2022-03-30 DIAGNOSIS — Z170 Estrogen receptor positive status [ER+]: Secondary | ICD-10-CM

## 2022-03-30 DIAGNOSIS — C50111 Malignant neoplasm of central portion of right female breast: Secondary | ICD-10-CM

## 2022-04-29 MED FILL — TAMOXIFEN CITRATE 20MG TABS: 20 20 MG | 90 days supply | Qty: 90 | Fill #2 | Status: AC

## 2022-05-16 ENCOUNTER — Encounter: Admit: 2022-05-16 | Discharge: 2022-05-16 | Payer: BLUE CROSS/BLUE SHIELD | Attending: Family | Primary: Family Medicine

## 2022-05-16 ENCOUNTER — Encounter

## 2022-05-16 ENCOUNTER — Inpatient Hospital Stay: Admit: 2022-05-16 | Payer: BLUE CROSS/BLUE SHIELD | Primary: Family Medicine

## 2022-05-16 DIAGNOSIS — Z170 Estrogen receptor positive status [ER+]: Secondary | ICD-10-CM

## 2022-05-16 DIAGNOSIS — C50111 Malignant neoplasm of central portion of right female breast: Secondary | ICD-10-CM

## 2022-05-16 LAB — CBC WITH AUTO DIFFERENTIAL
Absolute Immature Granulocyte: 0 10*3/uL (ref 0.0–0.5)
Basophils %: 1 % (ref 0.0–2.0)
Basophils Absolute: 0.1 10*3/uL (ref 0.0–0.2)
Eosinophils %: 1 % (ref 0.5–7.8)
Eosinophils Absolute: 0.1 10*3/uL (ref 0.0–0.8)
Hematocrit: 37.6 % (ref 35.8–46.3)
Hemoglobin: 12.6 g/dL (ref 11.7–15.4)
Immature Granulocytes: 0 % (ref 0.0–5.0)
Lymphocytes %: 23 % (ref 13–44)
Lymphocytes Absolute: 1.6 10*3/uL (ref 0.5–4.6)
MCH: 29.9 PG (ref 26.1–32.9)
MCHC: 33.5 g/dL (ref 31.4–35.0)
MCV: 89.1 FL (ref 82.0–102.0)
MPV: 10.3 FL (ref 9.4–12.3)
Monocytes %: 6 % (ref 4.0–12.0)
Monocytes Absolute: 0.4 10*3/uL (ref 0.1–1.3)
Neutrophils %: 69 % (ref 43–78)
Neutrophils Absolute: 4.7 10*3/uL (ref 1.7–8.2)
Platelets: 262 10*3/uL (ref 150–450)
RBC: 4.22 M/uL (ref 4.05–5.2)
RDW: 12.2 % (ref 11.9–14.6)
WBC: 6.9 10*3/uL (ref 4.3–11.1)
nRBC: 0 10*3/uL (ref 0.0–0.2)

## 2022-05-16 LAB — COMPREHENSIVE METABOLIC PANEL
ALT: 24 U/L (ref 12–65)
AST: 16 U/L (ref 15–37)
Albumin/Globulin Ratio: 1.2 (ref 0.4–1.6)
Albumin: 3.7 g/dL (ref 3.5–5.0)
Alk Phosphatase: 62 U/L (ref 50–136)
Anion Gap: 6 mmol/L (ref 2–11)
BUN: 15 MG/DL (ref 6–23)
CO2: 26 mmol/L (ref 21–32)
Calcium: 8.8 MG/DL (ref 8.3–10.4)
Chloride: 108 mmol/L (ref 101–110)
Creatinine: 1.2 MG/DL — ABNORMAL HIGH (ref 0.6–1.0)
Est, Glom Filt Rate: 53 mL/min/{1.73_m2} — ABNORMAL LOW (ref >60)
Globulin: 3.2 g/dL (ref 2.8–4.5)
Glucose: 99 mg/dL (ref 65–100)
Potassium: 3.6 mmol/L (ref 3.5–5.1)
Sodium: 140 mmol/L (ref 133–143)
Total Bilirubin: 0.4 MG/DL (ref 0.2–1.1)
Total Protein: 6.9 g/dL (ref 6.3–8.2)

## 2022-05-16 NOTE — Progress Notes (Signed)
[facility]: Office Visit Established Patient    Chief Complaint:    Chief Complaint   Patient presents with    Follow-up         History of Present Illness:  Jill Kaufman is a 56 y.o. female who returns today for management of breast cancer.  She underwent routine mammogram in September 2019 which showed an irregular mass density in the upper outer right breast.  She underwent ultrasound and biopsy which showed invasive ductal carcinoma, grade 2, ER 95%, PR 95%, HER2 negative.  MRI showed the lesion to  be 1.5 cm with no other abnormalities.  She was referred to Dr. Domenica Fail and underwent lumpectomy and sentinel node biopsy on 05/04/18 which showed 1.6 cm of tumor with negative margins and 1 sentinel lymph node negative for tumor.  Oncotype Dx testing  was low risk at 6; therefore, chemotherapy can be safely deferred.  We will refer her for radiation therapy, then begin endocrine therapy.  She has completed hysterectomy but her ovaries are intact,  her FSH/LH/E2 levels were borderline so we felt it would be prudent to proceed with tamoxifen for 2-3 years, then transition to AI once she is clearly post-menopausal.       She returns today for routine 6 month follow up on Tamoxifen(3.5 years).  Since last seen she has been well.  There are no GI or bowel complaints.  Appetite is good and weight is stable.  Energy level is also good.  There is no chest pain, shortness of breath, or edema.  Night sweats are ongoing.  She also continues with mild leg pain, unsure if related to Tamoxifen or statin.  She denies any breast changes or concerns.  There has been no vaginal bleeding or dryness.      Review of Systems:  Constitutional: Positive for night sweats.   HENT: Negative.   Eyes: Negative.   Respiratory: Negative.   Cardiovascular: Negative.   Gastrointestinal: Negative.   Genitourinary: Negative.   Musculoskeletal: Positive for myalgias of legs   Skin: Negative.   Neurological: Negative.    Endo/Heme/Allergies: Negative.   Psychiatric/Behavioral: Negative.   All other systems reviewed and are negative.       Allergies   Allergen Reactions    Codeine Nausea And Vomiting and Dizziness or Vertigo     Past Medical History:   Diagnosis Date    Breast cancer ([place])     T1cN0 right breast cancer    GERD (gastroesophageal reflux disease)     no medication at this time (noted 07/30/21)    High cholesterol     History of therapeutic radiation     Nausea & vomiting     PONV (postoperative nausea and vomiting)     Thyroid disease 06/14/2011    Hypothyroidism      Past Surgical History:   Procedure Laterality Date    BREAST BIOPSY Right 05/04/2018    RIGHT BREAST NEEDLE LOCALIZED BIOPSY performed by Lorenda Cahill, MD at [facility] LUMPECTOMY Right 05/04/2018    RIGHT BREAST LUMPECTOMY performed by Lorenda Cahill, MD at [facility]    COLONOSCOPY N/A 08/09/2021    COLORECTAL CANCER SCREENING, NOT HIGH RISK performed by Concepcion Living, MD at [facility]      hysterectomy partial-still has ovaries    HYSTERECTOMY (CERVIX STATUS UNKNOWN)      US GUIDED NEEDLE LOC OF RIGHT BREAST Right 05/04/2018  US GUIDED NEEDLE LOC OF RIGHT BREAST 05/04/2018 [facility]     Family History   Problem Relation Age of Onset    Heart Failure Mother     Cancer Father         throat cancer, skin cancer     Diabetes Father     Heart Attack Father         61's, now late 7's    Heart Attack Mother         late 87's, currently in her 2's     Social History     Socioeconomic History    Marital status: Married     Spouse name: Not on file    Number of children: Not on file    Years of education: Not on file    Highest education level: Not on file   Occupational History    Not on file   Tobacco Use    Smoking status: Never    Smokeless tobacco: Never   Vaping Use    Vaping Use: Never used   Substance and Sexual Activity    Alcohol use: Yes     Comment: ocassionally    Drug use: Not Currently    Sexual activity:  Not on file   Other Topics Concern    Not on file   Social History Narrative    Not on file     Social Determinants of Health     Financial Resource Strain: Unknown (09/01/2021)    Overall Financial Resource Strain (CARDIA)     Difficulty of Paying Living Expenses: Patient refused   Food Insecurity: Not on file (09/01/2021)   Transportation Needs: Unknown (09/01/2021)    PRAPARE - Armed forces logistics/support/administrative officer (Medical): Not on file     Lack of Transportation (Non-Medical): Patient refused   Physical Activity: Not on file   Stress: Not on file   Social Connections: Not on file   Intimate Partner Violence: Not on file   Housing Stability: Unknown (09/01/2021)    Housing Stability Vital Sign     Unable to Pay for Housing in the Last Year: Not on file     Number of [place] in the Last Year: Not on file     Unstable Housing in the Last Year: Patient refused     Current Outpatient Medications   Medication Sig Dispense Refill    Ivermectin 1 % CREA daily      tamoxifen (NOLVADEX) 20 MG tablet Take 1 tablet by mouth daily 90 tablet 3    lovastatin (MEVACOR) 10 MG tablet TAKE 1 TABLET BY MOUTH DAILY 100 tablet 2    levothyroxine (SYNTHROID) 75 MCG tablet TAKE 1 TABLET BY MOUTH DAILY BEFORE BREAKFAST 100 tablet 3     No current facility-administered medications for this visit.       OBJECTIVE:  BP (!) 143/98   Pulse 80   Temp 99 F (37.2 C) (Oral)   Resp 14   Ht 1.6 m (_0 )   Wt 66.4 kg (146 lb 6.4 oz)   SpO2 99%   BMI 25.93 kg/m   Pain Score:   0 - No pain (fatigue-0)    ECOG: 0    Physical Exam:  Constitutional: Well developed, well nourished female in no acute distress, sitting comfortably in the exam room chair.    HEENT: Normocephalic and atraumatic. Sclerae anicteric. Neck supple without JVD. No thyromegaly present.    Lymph node  No palpable submandibular, cervical, supraclavicular, or axillary lymph nodes.   Skin Warm and dry.  No bruising and no rash noted.  No erythema.  No pallor.     Respiratory Lungs are clear to auscultation bilaterally without wheezes, rales or rhonchi, normal air exchange without accessory muscle use.    CVS Normal rate, regular rhythm and normal S1 and S2.  No murmurs, gallops, or rubs.   Neuro Grossly nonfocal with no obvious sensory or motor deficits.   MSK Normal range of motion in general.  No edema and no tenderness.   Psych Appropriate mood and affect.      Labs:  Recent Results (from the past 96 hour(s))   CBC with Auto Differential    Collection Time: 05/16/22  2:02 PM   Result Value Ref Range    WBC 6.9 4.3 - 11.1 K/uL    RBC 4.22 4.05 - 5.2 M/uL    Hemoglobin 12.6 11.7 - 15.4 g/dL    Hematocrit 37.6 35.8 - 46.3 %    MCV 89.1 82.0 - 102.0 FL    MCH 29.9 26.1 - 32.9 PG    MCHC 33.5 31.4 - 35.0 g/dL    RDW 12.2 11.9 - 14.6 %    Platelets 262 150 - 450 K/uL    MPV 10.3 9.4 - 12.3 FL    nRBC 0.00 0.0 - 0.2 K/uL    Neutrophils % 69 43 - 78 %    Lymphocytes % 23 13 - 44 %    Monocytes % 6 4.0 - 12.0 %    Eosinophils % 1 0.5 - 7.8 %    Basophils % 1 0.0 - 2.0 %    Immature Granulocytes 0 0.0 - 5.0 %    Neutrophils Absolute 4.7 1.7 - 8.2 K/UL    Lymphocytes Absolute 1.6 0.5 - 4.6 K/UL    Monocytes Absolute 0.4 0.1 - 1.3 K/UL    Eosinophils Absolute 0.1 0.0 - 0.8 K/UL    Basophils Absolute 0.1 0.0 - 0.2 K/UL    Absolute Immature Granulocyte 0.0 0.0 - 0.5 K/UL    Differential Type AUTOMATED         Imaging:  MRI BREAST BILATERAL W WO CONTRAST    Result Date: 10/27/2021  1. No MRI evidence of residual or recurrent breast malignancy. 2. Stable post-treatment sequelae on the right. Annual screening mammography will be due in October 2023. She may benefit from continued supplemental MRI in the future, as appropriate. BI-RADS Assessment Category 2: Benign finding.          MRI BREAST BILATERAL W WO CONTRAST    Result Date: 11/10/2020  1.  No evidence of tumor recurrence or developing mass. 2.  Small area of skin enhancement in the lateral right breast, probably reactive.  Suggest  clinical correlation. BI-RADS Assessment Category 2: Benign finding.         ASSESSMENT:   Diagnosis Orders   1. Malignant neoplasm of central portion of right breast in female, estrogen receptor positive ([place])        2. Long-term current use of tamoxifen        3. Hot flashes due to tamoxifen                PLAN:  Lab studies were personally reviewed.    Breast cancer: 1.6 cm, grade 2 IDC, sentinel lymph node biopsy negative, ER 95%, PR 95%, HER2 negative.  S/p lumpectomy with negative margins.  She has completed definitive surgery.  Because of  the ER/PR positivity, the negative nodal assessment, and the T1 tumor, she would be an excellent candidate for Oncotype Dx for risk stratification, which was low risk at 6.  Therefore, chemotherapy can be safely deferred.  We will refer her for radiation  therapy, then begin endocrine therapy.  She has completed hysterectomy but her ovaries are intact, her FSH/LH/E2 levels were borderline so we felt it would be prudent to proceed with tamoxifen for 2-3 years, then transition to AI once she is clearly post-menopausal.   We discussed side effects of tamoxifen including the risk of VTE, as well as vasomotor symptoms and arthralgias.  She started tamoxifen after radiation was completed.       She returns today for routine 6 month follow up on Tamoxifen(3.5 years).  Since last seen she has been well.  There are no GI or bowel complaints.  Appetite is good and weight is stable.  Energy level is also good.  There is no chest pain, shortness of breath, or edema.  Night sweats are ongoing.  She also continues with mild leg pain, unsure if related to Tamoxifen or statin.  She denies any breast changes or concerns.  There has been no vaginal bleeding or dryness.  Labs reviewed, Cr 1.2-encouraged to push fluids, otherwise unremarkable.  Mammogram in October was benign, plan for repeat MRI in May 2024.  She will continue on Tamoxifen for 5 years and then repeat endocrine labs as they  were not post-menopausal in June 2023.  All questions were asked and answered to the best of my ability.  F/u in 6 months for recheck.            Florinda Marker) [place], [facility]  [facility] [place] [address]  Office : [phone]  Fax : [phone]

## 2022-06-02 MED ORDER — LOVASTATIN 10 MG PO TABS
10 MG | ORAL_TABLET | ORAL | 2 refills | Status: AC
Start: 2022-06-02 — End: 2023-04-03

## 2022-07-27 MED FILL — TAMOXIFEN CITRATE 20MG TABS: 20 20 MG | 90 days supply | Qty: 90 | Fill #3 | Status: AC

## 2022-08-31 ENCOUNTER — Encounter: Admit: 2022-08-31 | Discharge: 2022-08-31 | Payer: PRIVATE HEALTH INSURANCE | Primary: Family Medicine

## 2022-08-31 DIAGNOSIS — Z0000 Encounter for general adult medical examination without abnormal findings: Secondary | ICD-10-CM

## 2022-08-31 LAB — AMB POC KTY COMPLETE CBC
Granulocytes %, POC: 59 %
Granulocytes Abs: 4.3 10*3/uL
Hematocrit, POC: 40 %
Hemoglobin, POC: 13.5 G/DL
Lymphocyte %: 33.9 %
Lymphs Abs: 2.5 10*3/uL
MCHC: 33.8
MCV: 90.7
MPV POC: 8.1 fL
Monocyte %: 7.1 %
Monocyte Absolute, POC: 0.5 10*3/uL
Platelet Count, POC: 263 10*3/uL
RBC, POC: 4.41 M/uL
RDW, POC: 12.5 %
WBC, POC: 7.3 10*3/uL

## 2022-09-01 LAB — LIPID PANEL
Chol/HDL Ratio: 3.8
Cholesterol, Total: 184 MG/DL (ref <200)
HDL: 49 MG/DL (ref 40–60)
LDL Calculated: 111 MG/DL — ABNORMAL HIGH (ref <100)
Triglycerides: 120 MG/DL (ref 35–150)
VLDL Cholesterol Calculated: 24 MG/DL — ABNORMAL HIGH (ref 6.0–23.0)

## 2022-09-01 LAB — COMPREHENSIVE METABOLIC PANEL
ALT: 34 U/L (ref 12–65)
AST: 21 U/L (ref 15–37)
Albumin/Globulin Ratio: 1.2 (ref 0.4–1.6)
Albumin: 3.7 g/dL (ref 3.5–5.0)
Alk Phosphatase: 60 U/L (ref 50–136)
Anion Gap: 3 mmol/L (ref 2–11)
BUN: 15 MG/DL (ref 6–23)
CO2: 27 mmol/L (ref 21–32)
Calcium: 8.8 MG/DL (ref 8.3–10.4)
Chloride: 111 mmol/L (ref 103–113)
Creatinine: 0.9 MG/DL (ref 0.6–1.0)
Est, Glom Filt Rate: >60 mL/min/{1.73_m2} (ref >60)
Globulin: 3.1 g/dL (ref 2.8–4.5)
Glucose: 92 mg/dL (ref 65–100)
Potassium: 4.1 mmol/L (ref 3.5–5.1)
Sodium: 141 mmol/L (ref 136–146)
Total Bilirubin: 0.5 MG/DL (ref 0.2–1.1)
Total Protein: 6.8 g/dL (ref 6.3–8.2)

## 2022-09-01 LAB — TSH: TSH, 3RD GENERATION: 2.53 u[IU]/mL (ref 0.358–3.740)

## 2022-09-07 ENCOUNTER — Encounter: Payer: PRIVATE HEALTH INSURANCE | Attending: Family Medicine | Primary: Family Medicine

## 2022-09-14 MED ORDER — LEVOTHYROXINE SODIUM 75 MCG PO TABS
75 | ORAL_TABLET | ORAL | 3 refills | Status: AC
Start: 2022-09-14 — End: ?

## 2022-09-15 ENCOUNTER — Ambulatory Visit
Admit: 2022-09-15 | Discharge: 2022-09-15 | Payer: PRIVATE HEALTH INSURANCE | Attending: Family Medicine | Primary: Family Medicine

## 2022-09-15 DIAGNOSIS — Z0000 Encounter for general adult medical examination without abnormal findings: Secondary | ICD-10-CM

## 2022-09-15 NOTE — Progress Notes (Signed)
SUBJECTIVE:   Jill Kaufman is a 57 y.o. female who has a past medical history significant for hypothyroidism, high cholesterol, high triglycerides and breast cancer.  Review of systems reveals no complaints of chest pain, shortness of breath, orthopnea or PND.  Patient presents today for CPX.    HPI  See above    Past Medical History, Past Surgical History, Family history, Social History, and Medications were all reviewed with the patient today and updated as necessary.       Current Outpatient Medications   Medication Sig Dispense Refill    levothyroxine (SYNTHROID) 75 MCG tablet TAKE 1 TABLET BY MOUTH DAILY BEFORE BREAKFAST 90 tablet 3    lovastatin (MEVACOR) 10 MG tablet TAKE 1 TABLET BY MOUTH DAILY 100 tablet 2    Ivermectin 1 % CREA daily      tamoxifen (NOLVADEX) 20 MG tablet Take 1 tablet by mouth daily 90 tablet 3     No current facility-administered medications for this visit.     Allergies   Allergen Reactions    Codeine Nausea And Vomiting and Dizziness or Vertigo     Patient Active Problem List   Diagnosis    S/P lumpectomy of breast    Malignant neoplasm of central portion of right breast in female, estrogen receptor positive ([facility])    Abnormal breast finding    Statin intolerance    Abnormal results of liver function studies    Hypothyroidism    Mixed hyperlipidemia    Pain in joint, lower leg    Vitamin D deficiency     Past Medical History:   Diagnosis Date    Breast cancer ([facility])     T1cN0 right breast cancer    GERD (gastroesophageal reflux disease)     no medication at this time (noted 07/30/21)    High cholesterol     History of therapeutic radiation     Nausea & vomiting     PONV (postoperative nausea and vomiting)     Thyroid disease 06/14/2011    Hypothyroidism      Past Surgical History:   Procedure Laterality Date    BREAST BIOPSY Right 05/04/2018    RIGHT BREAST NEEDLE LOCALIZED BIOPSY performed by Iva Lento, MD at [facility] MAIN OR    BREAST LUMPECTOMY Right 05/04/2018    RIGHT  BREAST LUMPECTOMY performed by Iva Lento, MD at [facility] MAIN OR    COLONOSCOPY N/A 08/09/2021    COLORECTAL CANCER SCREENING, NOT HIGH RISK performed by Drucie Opitz, MD at [facility] ENDOSCOPY    GYN      hysterectomy partial-still has ovaries    HYSTERECTOMY (CERVIX STATUS UNKNOWN)      US GUIDED NEEDLE LOC OF RIGHT BREAST Right 05/04/2018    US GUIDED NEEDLE LOC OF RIGHT BREAST 05/04/2018 [facility]     Family History   Problem Relation Age of Onset    Heart Failure Mother     Cancer Father         throat cancer, skin cancer     Diabetes Father     Heart Attack Father         64's, now late 11's    Heart Attack Mother         late 64's, currently in her 69's     Social History     Tobacco Use    Smoking status: Never    Smokeless tobacco: Never   Substance Use Topics    Alcohol  use: Yes     Comment: ocassionally         Review of Systems  See above    OBJECTIVE:  BP 120/68   Pulse 72   Ht 1.6 m (5\' 3" )   Wt 66.4 kg (146 lb 6.4 oz)   SpO2 98%   BMI 25.93 kg/m      Physical Exam  Constitutional:       General: She is not in acute distress.     Appearance: Normal appearance. She is not ill-appearing.   HENT:      Head: Normocephalic and atraumatic.      Right Ear: Ear canal and external ear normal. There is no impacted cerumen.      Left Ear: Tympanic membrane, ear canal and external ear normal. There is no impacted cerumen.      Nose: Nose normal.      Mouth/Throat:      Mouth: Mucous membranes are moist.      Pharynx: Oropharynx is clear. No oropharyngeal exudate or posterior oropharyngeal erythema.   Eyes:      General: No scleral icterus.     Extraocular Movements: Extraocular movements intact.      Conjunctiva/sclera: Conjunctivae normal.      Pupils: Pupils are equal, round, and reactive to light.   Neck:      Vascular: No carotid bruit.   Cardiovascular:      Rate and Rhythm: Normal rate and regular rhythm.      Pulses: Normal pulses.      Heart sounds: Normal heart sounds. No murmur  heard.  Pulmonary:      Effort: Pulmonary effort is normal. No respiratory distress.      Breath sounds: Normal breath sounds. No wheezing.   Abdominal:      General: Abdomen is flat. Bowel sounds are normal. There is no distension.      Palpations: Abdomen is soft. There is no mass.      Tenderness: There is no abdominal tenderness.      Hernia: No hernia is present.   Musculoskeletal:         General: No swelling, tenderness or deformity. Normal range of motion.      Cervical back: Normal range of motion and neck supple.      Right lower leg: No edema.      Left lower leg: No edema.   Lymphadenopathy:      Cervical: No cervical adenopathy.   Skin:     General: Skin is warm.      Findings: No rash.   Neurological:      General: No focal deficit present.      Mental Status: She is alert and oriented to person, place, and time.      Cranial Nerves: No cranial nerve deficit.      Motor: No weakness.      Deep Tendon Reflexes: Reflexes normal.   Psychiatric:         Mood and Affect: Mood normal.         Behavior: Behavior normal.         Thought Content: Thought content normal.         Judgment: Judgment normal.         Medical problems and test results were reviewed with the patient today.         ASSESSMENT and PLAN    1.  CPX.  Anticipatory guidance discussed including the importance of sunscreen use, helmet use and  seatbelt use.  Blood pressure 120/68.  Screening colonoscopy, Pap and mammography are up-to-date.    2.  Hypothyroidism.  TSH 2.5.  Previously 4.0.who has a past medical history significant for hypothyroidism, high cholesterol, high triglycerides and breast cancer.  Review of systems reveals no complaints of chest pain, shortness of breath, orthopnea or PND.  Will continue current dose of Synthroid.    3.  High cholesterol.  LDL 111.  Previously 118.  Continue Mevacor.  Liver enzymes remain normal.    4.  High triglycerides.  Triglycerides 120.  Previously 211.  Continue dietary focus.    5.  History of  breast cancer.  Surveillance is up-to-date.    Elements of this note have been dictated using speech recognition software. As a result, errors of speech recognition may have occurred.

## 2022-09-21 ENCOUNTER — Telehealth
Admit: 2022-09-21 | Discharge: 2022-09-21 | Payer: PRIVATE HEALTH INSURANCE | Attending: Family Medicine | Primary: Family Medicine

## 2022-09-21 DIAGNOSIS — R519 Headache, unspecified: Secondary | ICD-10-CM

## 2022-09-21 MED ORDER — AZITHROMYCIN 250 MG PO TABS
250 MG | ORAL_TABLET | ORAL | 0 refills | Status: AC
Start: 2022-09-21 — End: 2023-03-23

## 2022-09-21 NOTE — Progress Notes (Signed)
SUBJECTIVE:   Jill Kaufman is a 57 y.o. female who has a past medical history significant for hypothyroidism, high cholesterol, high triglycerides and breast cancer.  Patient presents today for virtual visit via video MyChart messaging.  She reports a 5-day history of headache, yellow-green nasal drainage, facial pain and head congestion.  No fever, body aches, chest pain or shortness of breath.  Home COVID test negative.    Patient's vital signs were not performed as encounter was performed virtually.  Location of virtual visit was patient's home.      HPI  See above    Past Medical History, Past Surgical History, Family history, Social History, and Medications were all reviewed with the patient today and updated as necessary.       Current Outpatient Medications   Medication Sig Dispense Refill    levothyroxine (SYNTHROID) 75 MCG tablet TAKE 1 TABLET BY MOUTH DAILY BEFORE BREAKFAST 90 tablet 3    lovastatin (MEVACOR) 10 MG tablet TAKE 1 TABLET BY MOUTH DAILY 100 tablet 2    Ivermectin 1 % CREA daily      tamoxifen (NOLVADEX) 20 MG tablet Take 1 tablet by mouth daily 90 tablet 3     No current facility-administered medications for this visit.     Allergies   Allergen Reactions    Codeine Nausea And Vomiting and Dizziness or Vertigo     Patient Active Problem List   Diagnosis    S/P lumpectomy of breast    Malignant neoplasm of central portion of right breast in female, estrogen receptor positive (HCC)    Abnormal breast finding    Statin intolerance    Abnormal results of liver function studies    Hypothyroidism    Mixed hyperlipidemia    Pain in joint, lower leg    Vitamin D deficiency     Past Medical History:   Diagnosis Date    Breast cancer ([facility])     T1cN0 right breast cancer    GERD (gastroesophageal reflux disease)     no medication at this time (noted 07/30/21)    High cholesterol     History of therapeutic radiation     Nausea & vomiting     PONV (postoperative nausea and vomiting)     Thyroid  disease 06/14/2011    Hypothyroidism      Past Surgical History:   Procedure Laterality Date    BREAST BIOPSY Right 05/04/2018    RIGHT BREAST NEEDLE LOCALIZED BIOPSY performed by Iva Lento, MD at [facility] MAIN OR    BREAST LUMPECTOMY Right 05/04/2018    RIGHT BREAST LUMPECTOMY performed by Iva Lento, MD at [facility] MAIN OR    COLONOSCOPY N/A 08/09/2021    COLORECTAL CANCER SCREENING, NOT HIGH RISK performed by Drucie Opitz, MD at [facility] ENDOSCOPY    GYN      hysterectomy partial-still has ovaries    HYSTERECTOMY (CERVIX STATUS UNKNOWN)      US GUIDED NEEDLE LOC OF RIGHT BREAST Right 05/04/2018    US GUIDED NEEDLE LOC OF RIGHT BREAST 05/04/2018 [facility]     Family History   Problem Relation Age of Onset    Heart Failure Mother     Cancer Father         throat cancer, skin cancer     Diabetes Father     Heart Attack Father         66's, now late 65's    Heart Attack Mother  late 60's, currently in her 74's     Social History     Tobacco Use    Smoking status: Never    Smokeless tobacco: Never   Substance Use Topics    Alcohol use: Yes     Comment: ocassionally         Review of Systems  See above    OBJECTIVE:  There were no vitals taken for this visit.     Physical Exam  Constitutional:       General: She is not in acute distress.     Appearance: Normal appearance. She is not ill-appearing.   HENT:      Head:      Comments: Patient is able to demonstrate maxillary sinus tenderness with self palpation.  Pulmonary:      Effort: Pulmonary effort is normal. No respiratory distress.   Musculoskeletal:      Cervical back: Normal range of motion. No rigidity.   Neurological:      Mental Status: She is alert and oriented to person, place, and time.   Psychiatric:         Mood and Affect: Mood normal.         Behavior: Behavior normal.         Thought Content: Thought content normal.         Judgment: Judgment normal.         Medical problems and test results were reviewed with the patient today.          ASSESSMENT and PLAN    1.  Headache.  Underlying sinusitis.  Over-the-counter analgesics, plain Mucinex and Nasacort as needed.    2.  Facial pain.  As above.    3.  Sinusitis.  As above.  Z-Pak as directed.  Return if symptoms persist or worsen.          Consent:    Matteson Petrich, was evaluated through a synchronous (real-time) audio-video encounter. The patient (or guardian if applicable) is aware that this is a billable service, which includes applicable co-pays. This Virtual Visit was conducted with patient's (and/or legal guardian's) consent. Patient identification was verified, and a caregiver was present when appropriate.   The patient was located at Home: [address]  [place] [facility] [address]  Provider was located at Facility (Appt Dept): [address]  [place],  [place] [address]  Confirm you are appropriately licensed, registered, or certified to deliver care in the state where the patient is located as indicated above. If you are not or unsure, please re-schedule the visit: Yes, I confirm.          On this date 09/21/2022 I have spent 17 minutes reviewing previous notes, test results and face to face with the patient discussing the diagnosis and importance of compliance with the treatment plan as well as documenting on the day of the visit. Greater than 50% of this visit was spent counseling the patient about test results, prognosis, importance of compliance, education about disease process, benefits of medications, instructions for management of acute symptoms, and follow up plans.    --Delana Meyer, MD on 09/21/2022 at 12:57 PM    An electronic signature was used to authenticate this note.

## 2022-09-27 MED ORDER — TAMOXIFEN CITRATE 20 MG PO TABS
20 MG | ORAL_TABLET | Freq: Every day | ORAL | 3 refills | Status: AC
Start: 2022-09-27 — End: ?

## 2022-09-27 NOTE — Telephone Encounter (Signed)
Patient needs to contact the office.

## 2022-09-27 NOTE — Telephone Encounter (Signed)
Medication Requested: tamoxifen    Date last prescribed: 09/22/21    Requested Pharmacy: [facility]    Action Taken: Chart reviewed, refill pended and routed for signature.

## 2022-10-28 ENCOUNTER — Ambulatory Visit: Payer: PRIVATE HEALTH INSURANCE | Primary: Family Medicine

## 2022-10-28 DIAGNOSIS — C50111 Malignant neoplasm of central portion of right female breast: Secondary | ICD-10-CM

## 2022-10-28 DIAGNOSIS — Z170 Estrogen receptor positive status [ER+]: Secondary | ICD-10-CM

## 2022-10-28 MED ORDER — GADOTERIDOL 279.3 MG/ML IV SOLN
279.3 | Freq: Once | INTRAVENOUS | Status: AC | PRN
Start: 2022-10-28 — End: 2022-10-28
  Administered 2022-10-28: 14:00:00 13 mL via INTRAVENOUS

## 2022-10-28 MED FILL — PROHANCE 279.3 MG/ML IV SOLN: 279.3 MG/ML | INTRAVENOUS | Qty: 15

## 2022-11-10 ENCOUNTER — Encounter

## 2022-11-15 ENCOUNTER — Encounter: Payer: PRIVATE HEALTH INSURANCE | Primary: Family Medicine

## 2022-11-15 ENCOUNTER — Ambulatory Visit
Admit: 2022-11-15 | Discharge: 2022-11-15 | Payer: PRIVATE HEALTH INSURANCE | Attending: Family | Primary: Family Medicine

## 2022-11-15 VITALS — BP 127/83 | HR 78 | Temp 98.80000°F | Resp 16 | Ht 63.0 in | Wt 146.0 lb

## 2022-11-15 DIAGNOSIS — Z170 Estrogen receptor positive status [ER+]: Secondary | ICD-10-CM

## 2022-11-15 DIAGNOSIS — C50111 Malignant neoplasm of central portion of right female breast: Principal | ICD-10-CM

## 2022-11-15 LAB — CBC WITH AUTO DIFFERENTIAL
Basophils %: 1 % (ref 0.0–2.0)
Basophils Absolute: 0.1 10*3/uL (ref 0.0–0.2)
Eosinophils %: 2 % (ref 0.5–7.8)
Eosinophils Absolute: 0.1 10*3/uL (ref 0.0–0.8)
Hematocrit: 38.3 % (ref 35.8–46.3)
Hemoglobin: 13.1 g/dL (ref 11.7–15.4)
Immature Granulocytes %: 0 % (ref 0.0–5.0)
Immature Granulocytes Absolute: 0 10*3/uL (ref 0.0–0.5)
Lymphocytes %: 22 % (ref 13–44)
Lymphocytes Absolute: 1.5 10*3/uL (ref 0.5–4.6)
MCH: 30.5 PG (ref 26.1–32.9)
MCHC: 34.2 g/dL (ref 31.4–35.0)
MCV: 89.3 FL (ref 82.0–102.0)
MPV: 10.6 FL (ref 9.4–12.3)
Monocytes %: 5 % (ref 4.0–12.0)
Monocytes Absolute: 0.3 10*3/uL (ref 0.1–1.3)
Neutrophils %: 70 % (ref 43–78)
Neutrophils Absolute: 4.8 10*3/uL (ref 1.7–8.2)
Platelets: 239 10*3/uL (ref 150–450)
RBC: 4.29 M/uL (ref 4.05–5.2)
RDW: 12.4 % (ref 11.9–14.6)
WBC: 6.8 10*3/uL (ref 4.3–11.1)
nRBC: 0 10*3/uL (ref 0.0–0.2)

## 2022-11-15 LAB — COMPREHENSIVE METABOLIC PANEL
ALT: 20 U/L (ref 12–65)
AST: 22 U/L (ref 15–37)
Albumin/Globulin Ratio: 1.3 (ref 1.0–1.9)
Albumin: 3.8 g/dL (ref 3.5–5.0)
Alk Phosphatase: 58 U/L (ref 35–104)
Anion Gap: 9 mmol/L (ref 9–18)
BUN: 14 MG/DL (ref 6–23)
CO2: 26 mmol/L (ref 20–28)
Calcium: 8.8 MG/DL (ref 8.8–10.2)
Chloride: 105 mmol/L (ref 98–107)
Creatinine: 0.78 MG/DL (ref 0.60–1.10)
Est, Glom Filt Rate: 89 mL/min/{1.73_m2} (ref >60)
Globulin: 3 g/dL (ref 2.3–3.5)
Glucose: 118 mg/dL — ABNORMAL HIGH (ref 70–99)
Potassium: 3.8 mmol/L (ref 3.5–5.1)
Sodium: 140 mmol/L (ref 136–145)
Total Bilirubin: 0.3 MG/DL (ref 0.0–1.2)
Total Protein: 6.9 g/dL (ref 6.3–8.2)

## 2022-11-15 LAB — FOLLICLE STIMULATING HORMONE: FSH: 14.4 m[IU]/mL

## 2022-11-15 LAB — LUTEINIZING HORMONE: LH: 6.7 m[IU]/mL

## 2022-11-15 LAB — ESTRADIOL: Estradiol: <5 pg/mL

## 2022-11-15 NOTE — Progress Notes (Signed)
[facility]: Office Visit Established Patient    Chief Complaint:    Chief Complaint   Patient presents with    Follow-up         History of Present Illness:  Jill Kaufman is a 57 y.o. female who returns today for management of breast cancer.  She underwent routine mammogram in September 2019 which showed an irregular mass density in the upper outer right breast.  She underwent ultrasound and biopsy which showed invasive ductal carcinoma, grade 2, ER 95%, PR 95%, HER2 negative.  MRI showed the lesion to  be 1.5 cm with no other abnormalities.  She was referred to Dr. Iona Beard and underwent lumpectomy and sentinel node biopsy on 05/04/18 which showed 1.6 cm of tumor with negative margins and 1 sentinel lymph node negative for tumor.  Oncotype Dx testing  was low risk at 6; therefore, chemotherapy can be safely deferred.  We will refer her for radiation therapy, then begin endocrine therapy.  She has completed hysterectomy but her ovaries are intact,  her FSH/LH/E2 levels were borderline so we felt it would be prudent to proceed with tamoxifen for 2-3 years, then transition to AI once she is clearly post-menopausal.       She returns today for routine 6 month follow up on Tamoxifen (4 years).  Overall, she continues to tolerate treatment very well.    There are no GI or bowel complaints.  Appetite is good and weight is stable.  Her energy level remains good.  There is no chest pain, shortness of breath, or edema.  Her night sweats have decreased in frequency.  Since starting every other day Vit B and D her leg pains have improved.  She denies any breast changes or concerns.        Review of Systems:  Constitutional: Positive for night sweats-less frequent.   HENT: Negative.   Eyes: Negative.   Respiratory: Negative.   Cardiovascular: Negative.   Gastrointestinal: Negative.   Genitourinary: Negative.   Musculoskeletal: Positive for myalgias of legs-better   Skin: Negative.   Neurological: Negative.    Endo/Heme/Allergies: Negative.   Psychiatric/Behavioral: Negative.   All other systems reviewed and are negative.       Allergies   Allergen Reactions    Codeine Nausea And Vomiting and Dizziness or Vertigo     Past Medical History:   Diagnosis Date    Breast cancer (HCC)     T1cN0 right breast cancer    GERD (gastroesophageal reflux disease)     no medication at this time (noted 07/30/21)    High cholesterol     History of therapeutic radiation     Nausea & vomiting     PONV (postoperative nausea and vomiting)     Thyroid disease 06/14/2011    Hypothyroidism      Past Surgical History:   Procedure Laterality Date    BREAST BIOPSY Right 05/04/2018    RIGHT BREAST NEEDLE LOCALIZED BIOPSY performed by Iva Lento, MD at [facility] MAIN OR    BREAST LUMPECTOMY Right 05/04/2018    RIGHT BREAST LUMPECTOMY performed by Iva Lento, MD at [facility] MAIN OR    COLONOSCOPY N/A 08/09/2021    COLORECTAL CANCER SCREENING, NOT HIGH RISK performed by Drucie Opitz, MD at [facility] ENDOSCOPY    GYN      hysterectomy partial-still has ovaries    HYSTERECTOMY (CERVIX STATUS UNKNOWN)      US GUIDED NEEDLE LOC OF RIGHT BREAST Right  05/04/2018    US GUIDED NEEDLE LOC OF RIGHT BREAST 05/04/2018 [facility]     Family History   Problem Relation Age of Onset    Heart Failure Mother     Cancer Father         throat cancer, skin cancer     Diabetes Father     Heart Attack Father         78's, now late 2's    Heart Attack Mother         late 61's, currently in her 66's     Social History     Socioeconomic History    Marital status: Married     Spouse name: Not on file    Number of children: Not on file    Years of education: Not on file    Highest education level: Not on file   Occupational History    Not on file   Tobacco Use    Smoking status: Never    Smokeless tobacco: Never   Vaping Use    Vaping Use: Never used   Substance and Sexual Activity    Alcohol use: Yes     Comment: ocassionally    Drug use: Not Currently    Sexual activity:  Not on file   Other Topics Concern    Not on file   Social History Narrative    Not on file     Social Determinants of Health     Financial Resource Strain: Patient Declined (09/01/2021)    Overall Financial Resource Strain (CARDIA)     Difficulty of Paying Living Expenses: Patient declined   Food Insecurity: Not on file (09/01/2021)   Transportation Needs: Unknown (09/01/2021)    PRAPARE - Therapist, art (Medical): Not on file     Lack of Transportation (Non-Medical): Patient declined   Physical Activity: Not on file   Stress: Not on file   Social Connections: Not on file   Intimate Partner Violence: Not on file   Housing Stability: Unknown (09/01/2021)    Housing Stability Vital Sign     Unable to Pay for Housing in the Last Year: Not on file     Number of Places Lived in the Last Year: Not on file     Unstable Housing in the Last Year: Patient refused     Current Outpatient Medications   Medication Sig Dispense Refill    tamoxifen (NOLVADEX) 20 MG tablet Take 1 tablet by mouth daily 90 tablet 3    levothyroxine (SYNTHROID) 75 MCG tablet TAKE 1 TABLET BY MOUTH DAILY BEFORE BREAKFAST 90 tablet 3    lovastatin (MEVACOR) 10 MG tablet TAKE 1 TABLET BY MOUTH DAILY 100 tablet 2    azithromycin (ZITHROMAX) 250 MG tablet Take 2 Tablets with food by mouth first day, then 1 tab with food by mouth daily for days 2 through 5. (Patient not taking: Reported on 11/15/2022) 6 tablet 0    Ivermectin 1 % CREA daily (Patient not taking: Reported on 11/15/2022)       No current facility-administered medications for this visit.       OBJECTIVE:  BP 127/83   Pulse 78   Temp 98.8 F (37.1 C)   Resp 16   Ht 1.6 m (5\' 3" )   Wt 66.2 kg (146 lb)   SpO2 98%   BMI 25.86 kg/m   Pain Score:   0 - No pain (fatigue=0)  ECOG: 0    Physical Exam:  Constitutional: Well developed, well nourished female in no acute distress, sitting comfortably in the exam room chair.    HEENT: Normocephalic and atraumatic. Sclerae  anicteric. Neck supple without JVD. No thyromegaly present.    Lymph node   No palpable submandibular, cervical, supraclavicular, or axillary lymph nodes.   Skin Warm and dry.  No bruising and no rash noted.  No erythema.  No pallor.    Respiratory Lungs are clear to auscultation bilaterally without wheezes, rales or rhonchi, normal air exchange without accessory muscle use.    CVS Normal rate, regular rhythm and normal S1 and S2.  No murmurs, gallops, or rubs.   Neuro Grossly nonfocal with no obvious sensory or motor deficits.   MSK Normal range of motion in general.  No edema and no tenderness.   Psych Appropriate mood and affect.      Labs:  Recent Results (from the past 96 hour(s))   Comprehensive Metabolic Panel    Collection Time: 11/15/22  2:00 PM   Result Value Ref Range    Sodium 140 136 - 145 mmol/L    Potassium 3.8 3.5 - 5.1 mmol/L    Chloride 105 98 - 107 mmol/L    CO2 26 20 - 28 mmol/L    Anion Gap 9 9 - 18 mmol/L    Glucose 118 (H) 70 - 99 mg/dL    BUN 14 6 - 23 MG/DL    Creatinine 1.61 0.96 - 1.10 MG/DL    Est, Glom Filt Rate 89 >60 ml/min/1.30m2    Calcium 8.8 8.8 - 10.2 MG/DL    Total Bilirubin 0.3 0.0 - 1.2 MG/DL    ALT 20 12 - 65 U/L    AST 22 15 - 37 U/L    Alk Phosphatase 58 35 - 104 U/L    Total Protein 6.9 6.3 - 8.2 g/dL    Albumin 3.8 3.5 - 5.0 g/dL    Globulin 3.0 2.3 - 3.5 g/dL    Albumin/Globulin Ratio 1.3 1.0 - 1.9     CBC with Auto Differential    Collection Time: 11/15/22  2:00 PM   Result Value Ref Range    WBC 6.8 4.3 - 11.1 K/uL    RBC 4.29 4.05 - 5.2 M/uL    Hemoglobin 13.1 11.7 - 15.4 g/dL    Hematocrit 04.5 40.9 - 46.3 %    MCV 89.3 82.0 - 102.0 FL    MCH 30.5 26.1 - 32.9 PG    MCHC 34.2 31.4 - 35.0 g/dL    RDW 81.1 91.4 - 78.2 %    Platelets 239 150 - 450 K/uL    MPV 10.6 9.4 - 12.3 FL    nRBC 0.00 0.0 - 0.2 K/uL    Neutrophils % 70 43 - 78 %    Lymphocytes % 22 13 - 44 %    Monocytes % 5 4.0 - 12.0 %    Eosinophils % 2 0.5 - 7.8 %    Basophils % 1 0.0 - 2.0 %    Immature  Granulocytes % 0 0.0 - 5.0 %    Neutrophils Absolute 4.8 1.7 - 8.2 K/UL    Lymphocytes Absolute 1.5 0.5 - 4.6 K/UL    Monocytes Absolute 0.3 0.1 - 1.3 K/UL    Eosinophils Absolute 0.1 0.0 - 0.8 K/UL    Basophils Absolute 0.1 0.0 - 0.2 K/UL    Immature Granulocytes Absolute 0.0 0.0 - 0.5 K/UL  Differential Type AUTOMATED         Imaging:  MRI BREAST BILATERAL W WO CONTRAST    Result Date: 10/27/2021  1. No MRI evidence of residual or recurrent breast malignancy. 2. Stable post-treatment sequelae on the right. Annual screening mammography will be due in October 2023. She may benefit from continued supplemental MRI in the future, as appropriate. BI-RADS Assessment Category 2: Benign finding.          MRI BREAST BILATERAL W WO CONTRAST    Result Date: 11/10/2020  1.  No evidence of tumor recurrence or developing mass. 2.  Small area of skin enhancement in the lateral right breast, probably reactive.  Suggest clinical correlation. BI-RADS Assessment Category 2: Benign finding.         ASSESSMENT:   Diagnosis Orders   1. Malignant neoplasm of central portion of right breast in female, estrogen receptor positive (HCC)        2. Breast cancer screening by mammogram  MAM TOMO DIGITAL SCREEN BILATERAL      3. Long-term current use of tamoxifen        4. Hot flashes due to tamoxifen                PLAN:  Lab studies were personally reviewed.    Breast cancer: 1.6 cm, grade 2 IDC, sentinel lymph node biopsy negative, ER 95%, PR 95%, HER2 negative.  S/p lumpectomy with negative margins.  She has completed definitive surgery.  Because of  the ER/PR positivity, the negative nodal assessment, and the T1 tumor, she would be an excellent candidate for Oncotype Dx for risk stratification, which was low risk at 6.  Therefore, chemotherapy can be safely deferred.  We will refer her for radiation  therapy, then begin endocrine therapy.  She has completed hysterectomy but her ovaries are intact, her FSH/LH/E2 levels were borderline so we  felt it would be prudent to proceed with tamoxifen for 2-3 years, then transition to AI once she is clearly post-menopausal.   We discussed side effects of tamoxifen including the risk of VTE, as well as vasomotor symptoms and arthralgias.  She started tamoxifen after radiation was completed.       She returns today for routine 6 month follow up on Tamoxifen (4 years).  Overall, she continues to tolerate treatment very well.    There are no GI or bowel complaints.  Appetite is good and weight is stable.  Her energy level remains good.  There is no chest pain, shortness of breath, or edema.  Her night sweats have decreased in frequency.  Since starting every other day Vit B and D her leg pains have improved.  She denies any breast changes or concerns.  Labs reviewed and unremarkable, hormone levels pending.  MRI in May was benign, mammogram due in October.  She will continue on Tamoxifen unless hormone levels prove she is post-menopausal and if so she can change to AI if desired. All questions were asked and answered to the best of my ability.  F/u in 6 months for recheck.            Joaquin Bend) Lamont Glasscock, [facility]  [facility]ematology [facility] Oncology  [address]  [place], [place] [address]  Office : [phone]  Fax : [phone]

## 2023-01-24 LAB — BE WELL HEALTH SCREEN
Cholesterol, Total: 221 mg/dL — ABNORMAL HIGH (ref 0–200)
Glucose: 97 mg/dL (ref 70–99)
HDL: 47 mg/dL (ref 40–60)
LDL Cholesterol: 139 mg/dL — ABNORMAL HIGH (ref 0–100)
Triglycerides: 177 mg/dL — ABNORMAL HIGH (ref 0–150)

## 2023-03-17 ENCOUNTER — Encounter: Payer: PRIVATE HEALTH INSURANCE | Primary: Family Medicine

## 2023-03-17 ENCOUNTER — Encounter

## 2023-03-17 LAB — COMPREHENSIVE METABOLIC PANEL
ALT: 25 U/L (ref 8–45)
AST: 27 U/L (ref 15–37)
Albumin/Globulin Ratio: 1.4 (ref 1.0–1.9)
Albumin: 4.2 g/dL (ref 3.5–5.0)
Alk Phosphatase: 63 U/L (ref 35–104)
Anion Gap: 14 mmol/L (ref 9–18)
BUN: 16 mg/dL (ref 6–23)
CO2: 22 mmol/L (ref 20–28)
Calcium: 9.2 mg/dL (ref 8.8–10.2)
Chloride: 107 mmol/L (ref 98–107)
Creatinine: 0.92 mg/dL (ref 0.60–1.10)
Est, Glom Filt Rate: 72 mL/min/{1.73_m2} (ref >60)
Globulin: 2.9 g/dL (ref 2.3–3.5)
Glucose: 92 mg/dL (ref 70–99)
Potassium: 4.2 mmol/L (ref 3.5–5.1)
Sodium: 142 mmol/L (ref 136–145)
Total Bilirubin: 0.4 mg/dL (ref 0.0–1.2)
Total Protein: 7.1 g/dL (ref 6.3–8.2)

## 2023-03-17 LAB — LIPID PANEL
Chol/HDL Ratio: 4.6 (ref 0.0–5.0)
Cholesterol, Total: 207 mg/dL — ABNORMAL HIGH (ref 0–200)
HDL: 45 mg/dL (ref 40–60)
LDL Cholesterol: 127 mg/dL — ABNORMAL HIGH (ref 0–100)
Triglycerides: 174 mg/dL — ABNORMAL HIGH (ref 0–150)
VLDL Cholesterol Calculated: 35 mg/dL — ABNORMAL HIGH (ref 6–23)

## 2023-03-17 LAB — TSH: TSH, 3rd Generation: 3.77 u[IU]/mL (ref 0.270–4.200)

## 2023-03-23 ENCOUNTER — Encounter
Admit: 2023-03-23 | Discharge: 2023-03-23 | Payer: PRIVATE HEALTH INSURANCE | Attending: Family Medicine | Primary: Family Medicine

## 2023-03-23 DIAGNOSIS — E034 Atrophy of thyroid (acquired): Secondary | ICD-10-CM

## 2023-03-23 NOTE — Progress Notes (Signed)
SUBJECTIVE:   Jill Kaufman is a 57 y.o. female who has a past medical history significant for hypothyroidism, high cholesterol, high triglycerides and breast cancer. Review of systems reveals no complaints of chest pain, shortness of breath, orthopnea or PND.  GI and GU review of systems is unremarkable.  Current medications are listed in the EMR and reviewed today.      HPI  See above    Past Medical History, Past Surgical History, Family history, Social History, and Medications were all reviewed with the patient today and updated as necessary.       Current Outpatient Medications   Medication Sig Dispense Refill    tamoxifen (NOLVADEX) 20 MG tablet Take 1 tablet by mouth daily 90 tablet 3    levothyroxine (SYNTHROID) 75 MCG tablet TAKE 1 TABLET BY MOUTH DAILY BEFORE BREAKFAST 90 tablet 3    lovastatin (MEVACOR) 10 MG tablet TAKE 1 TABLET BY MOUTH DAILY 100 tablet 2    azithromycin (ZITHROMAX) 250 MG tablet Take 2 Tablets with food by mouth first day, then 1 tab with food by mouth daily for days 2 through 5. (Patient not taking: Reported on 11/15/2022) 6 tablet 0    Ivermectin 1 % CREA daily       No current facility-administered medications for this visit.     Allergies   Allergen Reactions    Codeine Nausea And Vomiting and Dizziness or Vertigo     Patient Active Problem List   Diagnosis    S/P lumpectomy of breast    Malignant neoplasm of central portion of right breast in female, estrogen receptor positive (HCC)    Abnormal breast finding    Statin intolerance    Abnormal results of liver function studies    Hypothyroidism    Mixed hyperlipidemia    Pain in joint, lower leg    Vitamin D deficiency     Past Medical History:   Diagnosis Date    Breast cancer (HCC)     T1cN0 right breast cancer    GERD (gastroesophageal reflux disease)     no medication at this time (noted 07/30/21)    High cholesterol     History of therapeutic radiation     Nausea & vomiting     PONV (postoperative nausea and vomiting)      Thyroid disease 06/14/2011    Hypothyroidism      Past Surgical History:   Procedure Laterality Date    BREAST BIOPSY Right 05/04/2018    RIGHT BREAST NEEDLE LOCALIZED BIOPSY performed by Iva Lento, MD at [facility] MAIN OR    BREAST LUMPECTOMY Right 05/04/2018    RIGHT BREAST LUMPECTOMY performed by Iva Lento, MD at [facility] MAIN OR    COLONOSCOPY N/A 08/09/2021    COLORECTAL CANCER SCREENING, NOT HIGH RISK performed by Drucie Opitz, MD at [facility] ENDOSCOPY    GYN      hysterectomy partial-still has ovaries    HYSTERECTOMY (CERVIX STATUS UNKNOWN)      US GUIDED NEEDLE LOC OF RIGHT BREAST Right 05/04/2018    US GUIDED NEEDLE LOC OF RIGHT BREAST 05/04/2018 [facility]Y MAMMO     Family History   Problem Relation Age of Onset    Heart Failure Mother     Cancer Father         throat cancer, skin cancer     Diabetes Father     Heart Attack Father         36's, now late  80's    Heart Attack Mother         late 79's, currently in her 51's     Social History     Tobacco Use    Smoking status: Never    Smokeless tobacco: Never   Substance Use Topics    Alcohol use: Yes     Comment: ocassionally         Review of Systems  See above    OBJECTIVE:  BP 122/82   Pulse 78   Wt 67.7 kg (149 lb 3.2 oz)   SpO2 97%   BMI 26.43 kg/m      Physical Exam  Constitutional:       General: She is not in acute distress.     Appearance: Normal appearance. She is not ill-appearing.   HENT:      Head: Normocephalic and atraumatic.   Cardiovascular:      Rate and Rhythm: Normal rate and regular rhythm.      Heart sounds: Normal heart sounds. No murmur heard.  Pulmonary:      Effort: Pulmonary effort is normal.      Breath sounds: Normal breath sounds. No wheezing or rhonchi.   Musculoskeletal:         General: Normal range of motion.      Cervical back: Normal range of motion and neck supple.      Right lower leg: No edema.      Left lower leg: No edema.   Skin:     General: Skin is warm.      Findings: No rash.   Neurological:      Mental  Status: She is alert and oriented to person, place, and time.   Psychiatric:         Mood and Affect: Mood normal.         Behavior: Behavior normal.         Thought Content: Thought content normal.         Judgment: Judgment normal.         Medical problems and test results were reviewed with the patient today.         ASSESSMENT and PLAN    1.  Hypothyroidism.  TSH 3.7.  The previously 2.5.  Continue current dose of Synthroid.    2.  High cholesterol.  LDL 127.  Previously 111.  Refocus on dietary management.    3.  High triglycerides.  Triglycerides are 174.  Previously 120.  As above.    4.  History of breast cancer.  Surveillance is up-to-date.    Elements of this note have been dictated using speech recognition software. As a result, errors of speech recognition may have occurred.

## 2023-04-03 ENCOUNTER — Inpatient Hospital Stay: Admit: 2023-04-03 | Payer: PRIVATE HEALTH INSURANCE | Primary: Family Medicine

## 2023-04-03 DIAGNOSIS — Z1231 Encounter for screening mammogram for malignant neoplasm of breast: Secondary | ICD-10-CM

## 2023-04-03 MED ORDER — LOVASTATIN 10 MG PO TABS
10 | ORAL_TABLET | ORAL | 3 refills | Status: DC
Start: 2023-04-03 — End: 2024-04-05

## 2023-05-19 ENCOUNTER — Encounter: Admit: 2023-05-19 | Payer: PRIVATE HEALTH INSURANCE | Admitting: Internal Medicine | Primary: Family Medicine

## 2023-05-19 ENCOUNTER — Encounter
Admit: 2023-05-19 | Discharge: 2023-05-20 | Disposition: A | Discharge status: Home / Self Care | Payer: PRIVATE HEALTH INSURANCE | Source: Ambulatory Visit | Attending: Family | Admitting: Family | Primary: Family Medicine

## 2023-05-19 VITALS — BP 118/66 | HR 77 | Temp 98.90000°F | Resp 16 | Ht 62.99 in | Wt 149.8 lb

## 2023-05-19 DIAGNOSIS — C50111 Malignant neoplasm of central portion of right female breast: Principal | ICD-10-CM

## 2023-05-19 DIAGNOSIS — Z170 Estrogen receptor positive status [ER+]: Secondary | ICD-10-CM

## 2023-05-19 LAB — CBC WITH AUTO DIFFERENTIAL
Basophils %: 1 % (ref 0.0–2.0)
Basophils Absolute: 0.1 10*3/uL (ref 0.0–0.2)
Eosinophils %: 1 % (ref 0.5–7.8)
Eosinophils Absolute: 0.1 10*3/uL (ref 0.0–0.8)
Hematocrit: 41.8 % (ref 35.8–46.3)
Hemoglobin: 14.1 g/dL (ref 11.7–15.4)
Immature Granulocytes %: 0 % (ref 0.0–5.0)
Immature Granulocytes Absolute: 0 10*3/uL (ref 0.0–0.5)
Lymphocytes %: 26 % (ref 13–44)
Lymphocytes Absolute: 2.1 10*3/uL (ref 0.5–4.6)
MCH: 29.9 pg (ref 26.1–32.9)
MCHC: 33.7 g/dL (ref 31.4–35.0)
MCV: 88.7 fL (ref 82.0–102.0)
MPV: 10 fL (ref 9.4–12.3)
Monocytes %: 6 % (ref 4.0–12.0)
Monocytes Absolute: 0.5 10*3/uL (ref 0.1–1.3)
Neutrophils %: 66 % (ref 43–78)
Neutrophils Absolute: 5.5 10*3/uL (ref 1.7–8.2)
Platelets: 271 10*3/uL (ref 150–450)
RBC: 4.71 M/uL (ref 4.05–5.2)
RDW: 12.2 % (ref 11.9–14.6)
WBC: 8.3 10*3/uL (ref 4.3–11.1)
nRBC: 0 10*3/uL (ref 0.0–0.2)

## 2023-05-19 LAB — COMPREHENSIVE METABOLIC PANEL
ALT: 20 U/L (ref 8–45)
AST: 23 U/L (ref 15–37)
Albumin/Globulin Ratio: 1.2 (ref 1.0–1.9)
Albumin: 4.1 g/dL (ref 3.5–5.0)
Alk Phosphatase: 66 U/L (ref 35–104)
Anion Gap: 12 mmol/L (ref 7–16)
BUN: 15 mg/dL (ref 6–23)
CO2: 25 mmol/L (ref 20–29)
Calcium: 9.4 mg/dL (ref 8.8–10.2)
Chloride: 102 mmol/L (ref 98–107)
Creatinine: 0.86 mg/dL (ref 0.60–1.10)
Est, Glom Filt Rate: 79 mL/min/{1.73_m2} (ref >60)
Globulin: 3.5 g/dL (ref 2.3–3.5)
Glucose: 95 mg/dL (ref 70–99)
Potassium: 3.8 mmol/L (ref 3.5–5.1)
Sodium: 139 mmol/L (ref 136–145)
Total Bilirubin: 0.3 mg/dL (ref 0.0–1.2)
Total Protein: 7.6 g/dL (ref 6.3–8.2)

## 2023-05-19 MED ORDER — ANASTROZOLE 1 MG PO TABS
1 MG | ORAL_TABLET | Freq: Every day | ORAL | 5 refills | Status: DC
Start: 2023-05-19 — End: 2023-11-17

## 2023-05-19 NOTE — Patient Instructions (Addendum)
 Patient Information from Today's Visit    The members of your [facility] are listed below:    Physician Provider: Charlton Amor, Medical Oncologist  Advanced Practice Clinician: Gillis Ends, NP  Registered Nurse: Bryna Colander., RN  Zane Herald

## 2023-05-19 NOTE — Progress Notes (Signed)
 [facility]: Office Visit Established Patient    Chief Complaint:    Chief Complaint   Patient presents with    Follow-up         History of Present Illness:  Jill Kaufman is a 57 y.o. female who returns today for management of breast cancer.  She underwent routine mammogram in September 2019 which showed an irregular mass density in the upper outer right breast.  She underwent ultrasound and biopsy which showed invasive ductal carcinoma, grade 2, ER 95%, PR 95%, HER2 negative.  MRI showed the lesion to  be 1.5 cm with no other abnormalities.  She was referred to Dr. Millican and underwent lumpectomy and sentinel node biopsy on 05/04/18 which showed 1.6 cm of tumor with negative margins and 1 sentinel lymph node negative for tumor.  Oncotype Dx testing  was low risk at 6; therefore, chemotherapy can be safely deferred.  We will refer her for radiation therapy, then begin endocrine therapy.  She has completed hysterectomy but her ovaries are intact,  her FSH/LH/E2 levels were borderline so we felt it would be prudent to proceed with tamoxifen  for 2-3 years, then transition to AI once she is clearly post-menopausal.       History of Present Illness  The patient is a 57 year old female who is here for follow-up of breast cancer, on tamoxifen  for almost 5 years.    She reports no new health concerns and continues to tolerate tamoxifen  well.  She was transitioned to Arimidex  over 2 years ago, but experienced ovarian reactivation and transitioned back to tamoxifen . During the previous experience with Arimidex ,her only complaint was that it caused her heart rate to increase.       Review of Systems:  Constitutional: Negative.   HENT: Negative.   Eyes: Negative.   Respiratory: Negative.   Cardiovascular: Negative.   Gastrointestinal: Negative.   Genitourinary: Negative.   Musculoskeletal: Negative.   Skin: Negative.   Neurological: Negative.   Endo/Heme/Allergies: Negative.   Psychiatric/Behavioral:  Negative.   All other systems reviewed and are negative.       Allergies   Allergen Reactions    Codeine Nausea And Vomiting and Dizziness or Vertigo     Past Medical History:   Diagnosis Date    Breast cancer (HCC)     T1cN0 right breast cancer    GERD (gastroesophageal reflux disease)     no medication at this time (noted 07/30/21)    High cholesterol     History of therapeutic radiation     Nausea & vomiting     PONV (postoperative nausea and vomiting)     Thyroid disease 06/14/2011    Hypothyroidism      Past Surgical History:   Procedure Laterality Date    BREAST BIOPSY Right 05/04/2018    RIGHT BREAST NEEDLE LOCALIZED BIOPSY performed by Hughes Maduro, MD at [facility] MAIN OR    BREAST LUMPECTOMY Right 05/04/2018    RIGHT BREAST LUMPECTOMY performed by Hughes Maduro, MD at [facility] MAIN OR    COLONOSCOPY N/A 08/09/2021    COLORECTAL CANCER SCREENING, NOT HIGH RISK performed by Margurette Shillings, MD at [facility] ENDOSCOPY    GYN      hysterectomy partial-still has ovaries    HYSTERECTOMY (CERVIX STATUS UNKNOWN)      US  GUIDED NEEDLE LOC OF RIGHT BREAST Right 05/04/2018    US  GUIDED NEEDLE LOC OF RIGHT BREAST 05/04/2018 [facility]     Family History  Problem Relation Age of Onset    Heart Failure Mother     Heart Attack Mother         late 86's, currently in her 73's    Cancer Father         throat cancer, skin cancer     Diabetes Father     Heart Attack Father         64's, now late 77's    Breast Cancer Neg Hx      Social History     Socioeconomic History    Marital status: Married     Spouse name: Not on file    Number of children: Not on file    Years of education: Not on file    Highest education level: Not on file   Occupational History    Not on file   Tobacco Use    Smoking status: Never     Passive exposure: Never    Smokeless tobacco: Never   Vaping Use    Vaping status: Never Used   Substance and Sexual Activity    Alcohol use: Yes     Comment: ocassionally    Drug use: Not Currently    Sexual activity:  Not on file   Other Topics Concern    Not on file   Social History Narrative    Not on file     Social Determinants of Health     Financial Resource Strain: Patient Declined (09/01/2021)    Overall Financial Resource Strain (CARDIA)     Difficulty of Paying Living Expenses: Patient declined   Food Insecurity: Not on file (09/01/2021)   Transportation Needs: Unknown (09/01/2021)    PRAPARE - Therapist, art (Medical): Not on file     Lack of Transportation (Non-Medical): Patient declined   Physical Activity: Not on file   Stress: Not on file (04/16/2023)   Social Connections: Not on file   Intimate Partner Violence: Low Risk  (09/22/2019)    Received from [facility]    Intimate Partner Violence     Insults You: Not on file     Threatens You: Not on file     Screams at You: Not on file     Physically Hurt: Not on file     Intimate Partner Violence Score: Not on file   Housing Stability: Unknown (09/01/2021)    Housing Stability Vital Sign     Unable to Pay for Housing in the Last Year: Not on file     Number of Places Lived in the Last Year: Not on file     Unstable Housing in the Last Year: Patient refused     Current Outpatient Medications   Medication Sig Dispense Refill    lovastatin  (MEVACOR ) 10 MG tablet TAKE 1 TABLET BY MOUTH DAILY 100 tablet 3    tamoxifen  (NOLVADEX ) 20 MG tablet Take 1 tablet by mouth daily 90 tablet 3    levothyroxine  (SYNTHROID ) 75 MCG tablet TAKE 1 TABLET BY MOUTH DAILY BEFORE BREAKFAST 90 tablet 3    Ivermectin 1 % CREA daily       No current facility-administered medications for this visit.       OBJECTIVE:  BP 118/66   Pulse 77   Temp 98.9 F (37.2 C) (Oral)   Resp 16   Ht 1.6 m (5' 2.99")   Wt 67.9 kg (149 lb 12.8 oz)   SpO2 98%   BMI 26.54  kg/m   Pain Score:   0 - No pain (fatigue-0)    ECOG: 0    Physical Exam:  Constitutional: Well developed, well nourished female in no acute distress, sitting comfortably in the exam room chair.     HEENT: Normocephalic and atraumatic. Sclerae anicteric. Neck supple without JVD. No thyromegaly present.    Lymph node   No palpable submandibular, cervical, supraclavicular, or axillary lymph nodes.   Skin Warm and dry.  No bruising and no rash noted.  No erythema.  No pallor.    Respiratory Lungs are clear to auscultation bilaterally without wheezes, rales or rhonchi, normal air exchange without accessory muscle use.    CVS Normal rate, regular rhythm and normal S1 and S2.  No murmurs, gallops, or rubs.   Neuro Grossly nonfocal with no obvious sensory or motor deficits.   MSK Normal range of motion in general.  No edema and no tenderness.   Psych Appropriate mood and affect.      Labs:  Recent Results (from the past 96 hour(s))   CBC with Auto Differential    Collection Time: 05/19/23  1:30 PM   Result Value Ref Range    WBC 8.3 4.3 - 11.1 K/uL    RBC 4.71 4.05 - 5.2 M/uL    Hemoglobin 14.1 11.7 - 15.4 g/dL    Hematocrit 40.9 81.1 - 46.3 %    MCV 88.7 82.0 - 102.0 FL    MCH 29.9 26.1 - 32.9 PG    MCHC 33.7 31.4 - 35.0 g/dL    RDW 91.4 78.2 - 95.6 %    Platelets 271 150 - 450 K/uL    MPV 10.0 9.4 - 12.3 FL    nRBC 0.00 0.0 - 0.2 K/uL    Neutrophils % 66 43 - 78 %    Lymphocytes % 26 13 - 44 %    Monocytes % 6 4.0 - 12.0 %    Eosinophils % 1 0.5 - 7.8 %    Basophils % 1 0.0 - 2.0 %    Immature Granulocytes % 0 0.0 - 5.0 %    Neutrophils Absolute 5.5 1.7 - 8.2 K/UL    Lymphocytes Absolute 2.1 0.5 - 4.6 K/UL    Monocytes Absolute 0.5 0.1 - 1.3 K/UL    Eosinophils Absolute 0.1 0.0 - 0.8 K/UL    Basophils Absolute 0.1 0.0 - 0.2 K/UL    Immature Granulocytes Absolute 0.0 0.0 - 0.5 K/UL    Differential Type AUTOMATED     Comprehensive Metabolic Panel    Collection Time: 05/19/23  1:30 PM   Result Value Ref Range    Sodium 139 136 - 145 mmol/L    Potassium 3.8 3.5 - 5.1 mmol/L    Chloride 102 98 - 107 mmol/L    CO2 25 20 - 29 mmol/L    Anion Gap 12 7 - 16 mmol/L    Glucose 95 70 - 99 mg/dL    BUN 15 6 - 23 MG/DL     Creatinine 2.13 0.86 - 1.10 MG/DL    Est, Glom Filt Rate 79 >60 ml/min/1.70m2    Calcium 9.4 8.8 - 10.2 MG/DL    Total Bilirubin 0.3 0.0 - 1.2 MG/DL    ALT 20 8 - 45 U/L    AST 23 15 - 37 U/L    Alk Phosphatase 66 35 - 104 U/L    Total Protein 7.6 6.3 - 8.2 g/dL    Albumin 4.1 3.5 - 5.0  g/dL    Globulin 3.5 2.3 - 3.5 g/dL    Albumin/Globulin Ratio 1.2 1.0 - 1.9         Imaging:  MRI BREAST BILATERAL W WO CONTRAST    Result Date: 10/27/2021  1. No MRI evidence of residual or recurrent breast malignancy. 2. Stable post-treatment sequelae on the right. Annual screening mammography will be due in October 2023. She may benefit from continued supplemental MRI in the future, as appropriate. BI-RADS Assessment Category 2: Benign finding.          MRI BREAST BILATERAL W WO CONTRAST    Result Date: 11/10/2020  1.  No evidence of tumor recurrence or developing mass. 2.  Small area of skin enhancement in the lateral right breast, probably reactive.  Suggest clinical correlation. BI-RADS Assessment Category 2: Benign finding.         ASSESSMENT:   Diagnosis Orders   1. Malignant neoplasm of central portion of right breast in female, estrogen receptor positive (HCC)  MRI BREAST BILATERAL W WO CONTRAST    Estradiol    Follicle Stimulating Hormone    Luteinizing Hormone                PLAN:  Lab studies were personally reviewed.    Breast cancer: 1.6 cm, grade 2 IDC, sentinel lymph node biopsy negative, ER 95%, PR 95%, HER2 negative.  S/p lumpectomy with negative margins.  She has completed definitive surgery.  Because of  the ER/PR positivity, the negative nodal assessment, and the T1 tumor, she would be an excellent candidate for Oncotype Dx for risk stratification, which was low risk at 6.  Therefore, chemotherapy can be safely deferred.  We will refer her for radiation  therapy, then begin endocrine therapy.  She has completed hysterectomy but her ovaries are intact, her FSH/LH/E2 levels were borderline so we felt it would be  prudent to proceed with tamoxifen  for 2-3 years, then transition to AI once she is clearly post-menopausal.   We discussed side effects of tamoxifen  including the risk of VTE, as well as vasomotor symptoms and arthralgias.  She started tamoxifen  after radiation was completed.       Assessment & Plan  1. Breast Cancer.  Her estrogen level has decreased back to a postmenopausal level, indicating that a switch to Arimidex  could be beneficial.  Rationale of this approach was again discussed, due to slightly better efficacy than tamoxifen  in studies, with similar or potentially fewer side effects. However, it may cause some bone loss, necessitating regular bone density scans. Her previous scan was normal, albeit at the lower end of the normal range. Given her current lab results, this treatment plan is appropriate. Repeat FSH/LH/E2 levels will be ordered to be conducted in January 2025, and the results will be communicated via MyChart.  An MRI is scheduled for May 2025. A prescription for Arimidex  will be sent to her pharmacy, she is advised to continue with her tamoxifen  until the Arimidex  is available. If her lab results again revert to a premenopausal range consistent with those from 2022, she will revert to tamoxifen  and maintain this regimen.  However, this seems unlikely given her age.  Our goal will be a total of 10 years of endocrine therapy regardless of specific agent.      2. Health Maintenance.  She will continue with regular MRI and mammogram screenings every 6 months to ensure early detection of any recurrence.    Follow-up  Return in 6 months for  follow up.               Doll French) Nicks, [facility]  [facility]  [address]  [place], [place] [address]  Office : [phone]  Fax : [phone]

## 2023-07-06 ENCOUNTER — Encounter

## 2023-07-07 LAB — FOLLICLE STIMULATING HORMONE: FSH: 17.2 m[IU]/mL

## 2023-07-07 LAB — ESTRADIOL: Estradiol: <5 pg/mL

## 2023-07-07 LAB — LUTEINIZING HORMONE: LH: 8.1 m[IU]/mL

## 2023-08-25 MED ORDER — LEVOTHYROXINE SODIUM 75 MCG PO TABS
75 | ORAL_TABLET | Freq: Every day | ORAL | 0 refills | Status: AC
Start: 2023-08-25 — End: ?

## 2023-08-25 NOTE — Telephone Encounter (Signed)
 Refill requested     Preferred pharm: [facility] [place] - [address] - P [phone] - F [phone]

## 2023-09-06 LAB — BE WELL HEALTH SCREEN
Cholesterol, Total: 201 mg/dL — ABNORMAL HIGH (ref 0–200)
Glucose: 100 mg/dL — ABNORMAL HIGH (ref 70–99)
HDL: 46 mg/dL (ref 40–60)
LDL Cholesterol: 122 mg/dL — ABNORMAL HIGH (ref 0–100)
Triglycerides: 167 mg/dL — ABNORMAL HIGH (ref 0–150)

## 2023-09-13 ENCOUNTER — Encounter

## 2023-09-15 ENCOUNTER — Other Ambulatory Visit: Admit: 2023-09-15 | Discharge: 2023-09-15 | Payer: PRIVATE HEALTH INSURANCE | Primary: Family Medicine

## 2023-09-15 DIAGNOSIS — Z0000 Encounter for general adult medical examination without abnormal findings: Secondary | ICD-10-CM

## 2023-09-15 LAB — COMPREHENSIVE METABOLIC PANEL
ALT: 26 U/L (ref 8–45)
AST: 20 U/L (ref 15–37)
Albumin/Globulin Ratio: 1.4 (ref 1.0–1.9)
Albumin: 4 g/dL (ref 3.5–5.0)
Alk Phosphatase: 83 U/L (ref 35–104)
Anion Gap: 12 mmol/L (ref 7–16)
BUN: 18 mg/dL (ref 6–23)
CO2: 26 mmol/L (ref 20–29)
Calcium: 9.5 mg/dL (ref 8.8–10.2)
Chloride: 105 mmol/L (ref 98–107)
Creatinine: 0.83 mg/dL (ref 0.60–1.10)
Est, Glom Filt Rate: 82 mL/min/{1.73_m2} (ref >60)
Globulin: 2.9 g/dL (ref 2.3–3.5)
Glucose: 88 mg/dL (ref 70–99)
Potassium: 4.5 mmol/L (ref 3.5–5.1)
Sodium: 143 mmol/L (ref 136–145)
Total Bilirubin: 0.5 mg/dL (ref 0.0–1.2)
Total Protein: 6.9 g/dL (ref 6.3–8.2)

## 2023-09-15 LAB — CBC WITH AUTO DIFFERENTIAL
Basophils %: 0.9 % (ref 0.0–2.0)
Basophils Absolute: 0.06 10*3/uL (ref 0.00–0.20)
Eosinophils %: 1.9 % (ref 0.5–7.8)
Eosinophils Absolute: 0.13 10*3/uL (ref 0.00–0.80)
Hematocrit: 38.8 % (ref 35.8–46.3)
Hemoglobin: 13.5 g/dL (ref 11.7–15.4)
Immature Granulocytes %: 0.3 % (ref 0.0–5.0)
Immature Granulocytes Absolute: 0.02 10*3/uL (ref 0.0–0.5)
Lymphocytes %: 23.9 % (ref 13.0–44.0)
Lymphocytes Absolute: 1.6 10*3/uL (ref 0.50–4.60)
MCH: 29.9 pg (ref 26.1–32.9)
MCHC: 34.8 g/dL (ref 31.4–35.0)
MCV: 85.8 FL (ref 82–102)
MPV: 10.8 FL (ref 9.4–12.3)
Monocytes %: 6.4 % (ref 4.0–12.0)
Monocytes Absolute: 0.43 10*3/uL (ref 0.10–1.30)
Neutrophils %: 66.6 % (ref 43.0–78.0)
Neutrophils Absolute: 4.46 10*3/uL (ref 1.70–8.20)
Platelets: 266 10*3/uL (ref 150–450)
RBC: 4.52 M/uL (ref 4.05–5.2)
RDW: 12.4 % (ref 11.9–14.6)
WBC: 6.7 10*3/uL (ref 4.3–11.1)
nRBC: 0 10*3/uL (ref 0.0–0.2)

## 2023-09-15 LAB — LIPID PANEL
Chol/HDL Ratio: 4.4 (ref 0.0–5.0)
Cholesterol, Total: 198 mg/dL (ref 0–200)
HDL: 45 mg/dL (ref 40–60)
LDL Cholesterol: 120 mg/dL — ABNORMAL HIGH (ref 0–100)
Triglycerides: 164 mg/dL — ABNORMAL HIGH (ref 0–150)
VLDL Cholesterol Calculated: 33 mg/dL — ABNORMAL HIGH (ref 6–23)

## 2023-09-15 LAB — TSH: TSH, 3rd Generation: 1.83 u[IU]/mL (ref 0.270–4.200)

## 2023-09-29 ENCOUNTER — Ambulatory Visit
Admit: 2023-09-29 | Discharge: 2023-09-29 | Payer: PRIVATE HEALTH INSURANCE | Attending: Family Medicine | Primary: Family Medicine

## 2023-09-29 VITALS — BP 116/76 | HR 84 | Ht 62.0 in | Wt 151.2 lb

## 2023-09-29 DIAGNOSIS — Z0000 Encounter for general adult medical examination without abnormal findings: Secondary | ICD-10-CM

## 2023-09-29 MED ORDER — LEVOTHYROXINE SODIUM 75 MCG PO TABS
75 | ORAL_TABLET | Freq: Every day | ORAL | 0 refills | 90.00000 days | Status: DC
Start: 2023-09-29 — End: 2024-02-09

## 2023-09-29 NOTE — Progress Notes (Signed)
 "  Have you been to the ER, urgent care clinic since your last visit?  Hospitalized since your last visit?"    NO    "Have you seen or consulted any other health care providers outside our system since your last visit?"    NO

## 2023-09-29 NOTE — Progress Notes (Signed)
 SUBJECTIVE:   Jill Kaufman is a 58 y.o. female who has a past medical history significant for hypothyroidism, high cholesterol, high triglycerides and breast cancer.  Patient presents today for a CPX.Review of systems reveals no complaints of chest pain, shortness of breath, orthopnea or PND.  GI and GU review of systems is unremarkable.  Current medications are listed in the EMR and reviewed today.      HPI  See above    Past Medical History, Past Surgical History, Family history, Social History, and Medications were all reviewed with the patient today and updated as necessary.       Current Outpatient Medications   Medication Sig Dispense Refill    levothyroxine  (SYNTHROID ) 75 MCG tablet TAKE 1 TABLET BY MOUTH DAILY BEFORE BREAKFAST 90 tablet 0    anastrozole  (ARIMIDEX ) 1 MG tablet Take 1 tablet by mouth daily 30 tablet 5    lovastatin  (MEVACOR ) 10 MG tablet TAKE 1 TABLET BY MOUTH DAILY 100 tablet 3    Ivermectin 1 % CREA daily      tamoxifen  (NOLVADEX ) 20 MG tablet Take 1 tablet by mouth daily (Patient not taking: Reported on 09/29/2023) 90 tablet 3     No current facility-administered medications for this visit.     Allergies   Allergen Reactions    Codeine Nausea And Vomiting and Dizziness or Vertigo     Patient Active Problem List   Diagnosis    S/P lumpectomy of breast    Malignant neoplasm of central portion of right breast in female, estrogen receptor positive    Abnormal breast finding    Statin intolerance    Abnormal results of liver function studies    Hypothyroidism    Mixed hyperlipidemia    Pain in joint, lower leg    Vitamin D deficiency     Past Medical History:   Diagnosis Date    Breast cancer     T1cN0 right breast cancer    GERD (gastroesophageal reflux disease)     no medication at this time (noted 07/30/21)    High cholesterol     History of therapeutic radiation     Nausea & vomiting     PONV (postoperative nausea and vomiting)     Thyroid disease 06/14/2011    Hypothyroidism      Past  Surgical History:   Procedure Laterality Date    BREAST BIOPSY Right 05/04/2018    RIGHT BREAST NEEDLE LOCALIZED BIOPSY performed by Hughes Maduro, MD at [facility] MAIN OR    BREAST LUMPECTOMY Right 05/04/2018    RIGHT BREAST LUMPECTOMY performed by Hughes Maduro, MD at [facility] MAIN OR    COLONOSCOPY N/A 08/09/2021    COLORECTAL CANCER SCREENING, NOT HIGH RISK performed by Margurette Shillings, MD at [facility] ENDOSCOPY    GYN      hysterectomy partial-still has ovaries    HYSTERECTOMY (CERVIX STATUS UNKNOWN)      US  PLACE BREAST LOC DEVICE 1ST LESION RIGHT Right 05/04/2018    US  GUIDED NEEDLE LOC OF RIGHT BREAST 05/04/2018 [facility]     Family History   Problem Relation Age of Onset    Heart Failure Mother     Heart Attack Mother         late 27's, currently in her 38's    Cancer Father         throat cancer, skin cancer     Diabetes Father     Heart Attack Father  60's, now late 80's    Breast Cancer Neg Hx      Social History     Tobacco Use    Smoking status: Never     Passive exposure: Never    Smokeless tobacco: Never   Substance Use Topics    Alcohol use: Yes     Comment: ocassionally         Review of Systems  See above    OBJECTIVE:  BP 116/76 (BP Site: Left Upper Arm, Patient Position: Sitting, BP Cuff Size: Large Adult)   Pulse 84   Ht 1.575 m (5\' 2" )   Wt 68.6 kg (151 lb 3.2 oz)   SpO2 98%   BMI 27.65 kg/m      Physical Exam  Constitutional:       General: She is not in acute distress.     Appearance: Normal appearance. She is not ill-appearing.   HENT:      Head: Normocephalic and atraumatic.      Right Ear: Tympanic membrane, ear canal and external ear normal. There is no impacted cerumen.      Left Ear: Tympanic membrane, ear canal and external ear normal. There is no impacted cerumen.      Nose: Nose normal.      Mouth/Throat:      Mouth: Mucous membranes are moist.      Pharynx: Oropharynx is clear. No oropharyngeal exudate or posterior oropharyngeal erythema.   Eyes:      General: No scleral  icterus.     Extraocular Movements: Extraocular movements intact.      Conjunctiva/sclera: Conjunctivae normal.      Pupils: Pupils are equal, round, and reactive to light.   Neck:      Vascular: No carotid bruit.   Cardiovascular:      Rate and Rhythm: Normal rate and regular rhythm.      Pulses: Normal pulses.      Heart sounds: Normal heart sounds. No murmur heard.  Pulmonary:      Effort: Pulmonary effort is normal. No respiratory distress.      Breath sounds: Normal breath sounds. No wheezing.   Abdominal:      General: Abdomen is flat. Bowel sounds are normal. There is no distension.      Palpations: Abdomen is soft. There is no mass.      Tenderness: There is no abdominal tenderness.      Hernia: No hernia is present.   Musculoskeletal:         General: No swelling, tenderness or deformity. Normal range of motion.      Cervical back: Normal range of motion and neck supple.      Right lower leg: No edema.      Left lower leg: No edema.   Lymphadenopathy:      Cervical: No cervical adenopathy.   Skin:     General: Skin is warm.      Findings: No rash.   Neurological:      General: No focal deficit present.      Mental Status: She is alert and oriented to person, place, and time.      Cranial Nerves: No cranial nerve deficit.      Motor: No weakness.      Deep Tendon Reflexes: Reflexes normal.   Psychiatric:         Mood and Affect: Mood normal.         Behavior: Behavior normal.  Thought Content: Thought content normal.         Judgment: Judgment normal.         Medical problems and test results were reviewed with the patient today.         ASSESSMENT and PLAN    1.  CPX.  Anticipatory guidance discussed including importance of sunscreen use, helmet use and seatbelt use.  Screening colonoscopy is up-to-date.  Mammography is up-to-date.  Bone density has not.  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  Last bone density showed some osteopenia performed in  2022.  Recommendation for 2-year follow-up.  Bone density will be ordered.    2.  High cholesterol.  LDL is 120.  Continue statin.  Liver enzymes remain normal.    3.  High triglycerides.  Triglycerides are 164.  Previously 174.  Dietary focus.    4.  Hypothyroidism.  TSH 1.8.  Continue current dose of Synthroid .    5.  History of breast cancer.  See 1 above.  Checking bone density.    6.  Menopause.  Chemically induced.  As above.    Elements of this note have been dictated using speech recognition software. As a result, errors of speech recognition may have occurred.

## 2023-10-10 ENCOUNTER — Inpatient Hospital Stay: Admit: 2023-10-10 | Payer: PRIVATE HEALTH INSURANCE | Attending: Family Medicine | Primary: Family Medicine

## 2023-10-10 DIAGNOSIS — C50111 Malignant neoplasm of central portion of right female breast: Secondary | ICD-10-CM

## 2023-10-17 NOTE — Telephone Encounter (Addendum)
 Pt was called and informed of the results of the Dexa scan below. Pt was advised to take 800 units of Vitamin D and 12  units of calcium daily. Pt understood with no further questions or concerns.     ----- Message from Dr. Soledad Dupes, MD sent at 10/13/2023  1:54 PM EDT -----  Please inform patient that her bone density shows some worsening of her bone density since last scan.  She is still osteopenic and not osteoporotic.  She should be taking 800 IUs of vitamin D and 1200 IUs of calcium.  ----- Message -----  From: Edi, [facility] Incoming Orders Results To Radiant  Sent: 10/11/2023   9:05 AM EDT  To: Sherley Distad, MD

## 2023-10-30 ENCOUNTER — Inpatient Hospital Stay: Admit: 2023-10-30 | Payer: PRIVATE HEALTH INSURANCE | Attending: Internal Medicine | Primary: Family Medicine

## 2023-10-30 DIAGNOSIS — C50111 Malignant neoplasm of central portion of right female breast: Secondary | ICD-10-CM

## 2023-10-30 MED ORDER — GADOTERIDOL 279.3 MG/ML IV SOLN
279.3 | Freq: Once | INTRAVENOUS | Status: AC | PRN
Start: 2023-10-30 — End: 2023-10-30
  Administered 2023-10-30: 19:00:00 14 mL via INTRAVENOUS

## 2023-10-30 MED FILL — PROHANCE 279.3 MG/ML IV SOLN: 279.3 MG/ML | INTRAVENOUS | Qty: 15 | Fill #0

## 2023-11-15 ENCOUNTER — Encounter

## 2023-11-17 ENCOUNTER — Inpatient Hospital Stay: Admit: 2023-11-17 | Payer: PRIVATE HEALTH INSURANCE | Primary: Family Medicine

## 2023-11-17 ENCOUNTER — Ambulatory Visit
Admit: 2023-11-17 | Discharge: 2023-11-17 | Payer: PRIVATE HEALTH INSURANCE | Attending: Internal Medicine | Primary: Family Medicine

## 2023-11-17 VITALS — BP 120/82 | HR 72 | Temp 98.00000°F | Resp 20 | Ht 62.99 in | Wt 151.0 lb

## 2023-11-17 DIAGNOSIS — C50111 Malignant neoplasm of central portion of right female breast: Secondary | ICD-10-CM

## 2023-11-17 DIAGNOSIS — Z170 Estrogen receptor positive status [ER+]: Secondary | ICD-10-CM

## 2023-11-17 LAB — ESTRADIOL: Estradiol: <5 pg/mL

## 2023-11-17 LAB — FOLLICLE STIMULATING HORMONE: FSH: 48.4 m[IU]/mL

## 2023-11-17 LAB — COMPREHENSIVE METABOLIC PANEL
ALT: 30 U/L (ref 8–45)
AST: 24 U/L (ref 15–37)
Albumin/Globulin Ratio: 1.3 (ref 1.0–1.9)
Albumin: 4 g/dL (ref 3.5–5.0)
Alk Phosphatase: 97 U/L (ref 35–104)
Anion Gap: 12 mmol/L (ref 7–16)
BUN: 17 mg/dL (ref 6–23)
CO2: 25 mmol/L (ref 20–29)
Calcium: 9.4 mg/dL (ref 8.8–10.2)
Chloride: 103 mmol/L (ref 98–107)
Creatinine: 0.86 mg/dL (ref 0.60–1.10)
Est, Glom Filt Rate: 78 mL/min/{1.73_m2} (ref >60)
Globulin: 3.2 g/dL (ref 2.3–3.5)
Glucose: 109 mg/dL — ABNORMAL HIGH (ref 70–99)
Potassium: 3.9 mmol/L (ref 3.5–5.1)
Sodium: 140 mmol/L (ref 136–145)
Total Bilirubin: 0.4 mg/dL (ref 0.0–1.2)
Total Protein: 7.2 g/dL (ref 6.3–8.2)

## 2023-11-17 LAB — CBC WITH AUTO DIFFERENTIAL
Basophils %: 0.7 % (ref 0.0–2.0)
Basophils Absolute: 0.05 10*3/uL (ref 0.00–0.20)
Eosinophils %: 1.9 % (ref 0.5–7.8)
Eosinophils Absolute: 0.14 10*3/uL (ref 0.00–0.80)
Hematocrit: 40.2 % (ref 35.8–46.3)
Hemoglobin: 13.8 g/dL (ref 11.7–15.4)
Immature Granulocytes %: 0.1 % (ref 0.0–5.0)
Immature Granulocytes Absolute: 0.01 10*3/uL (ref 0.00–0.50)
Lymphocytes %: 23.2 % (ref 13.0–44.0)
Lymphocytes Absolute: 1.73 10*3/uL (ref 0.50–4.60)
MCH: 29.9 pg (ref 26.1–32.9)
MCHC: 34.3 g/dL (ref 31.4–35.0)
MCV: 87 FL (ref 82.0–102.0)
MPV: 10.4 FL (ref 9.4–12.3)
Monocytes %: 5.4 % (ref 4.0–12.0)
Monocytes Absolute: 0.4 10*3/uL (ref 0.10–1.30)
Neutrophils %: 68.7 % (ref 43.0–78.0)
Neutrophils Absolute: 5.14 10*3/uL (ref 1.70–8.20)
Platelets: 296 10*3/uL (ref 150–450)
RBC: 4.62 M/uL (ref 4.05–5.2)
RDW: 12.5 % (ref 11.9–14.6)
WBC: 7.5 10*3/uL (ref 4.3–11.1)
nRBC: 0 10*3/uL (ref 0.0–0.2)

## 2023-11-17 LAB — LUTEINIZING HORMONE: LH: 24.4 m[IU]/mL

## 2023-11-17 MED ORDER — ANASTROZOLE 1 MG PO TABS
1 | ORAL_TABLET | Freq: Every day | ORAL | 3 refills | 90.00000 days | Status: AC
Start: 2023-11-17 — End: ?

## 2023-11-17 NOTE — Patient Instructions (Signed)
 Patient Information from Today's Visit    The members of your [facility] are listed below:    Physician Provider: Dr. Lucius Sabins  Advanced Practice Clinician: Danae Duncans  Registered Nurse: Alvan August   Nurse Navigator: Reynaldo Cavalier., RN and Howard Macho., RN  Medical Assistant: Russella Courts" M.   Scheduler: Illene Malm "Rosalie" C.   Supportive Care Services: Kallison G., LSMW    Diagnosis (Information Sheet Provided on Day of Diagnosis): Breast Cancer    Follow Up Instructions:   6 months    Has Treatment Plan Been Finalized? N/A     Current Labs:   Hospital Outpatient Visit on 11/17/2023   Component Date Value Ref Range Status    WBC 11/17/2023 7.5  4.3 - 11.1 K/uL Final    RBC 11/17/2023 4.62  4.05 - 5.2 M/uL Final    Hemoglobin 11/17/2023 13.8  11.7 - 15.4 g/dL Final    Hematocrit 16/03/9603 40.2  35.8 - 46.3 % Final    MCV 11/17/2023 87.0  82.0 - 102.0 FL Final    MCH 11/17/2023 29.9  26.1 - 32.9 PG Final    MCHC 11/17/2023 34.3  31.4 - 35.0 g/dL Final    RDW 54/02/8118 12.5  11.9 - 14.6 % Final    Platelets 11/17/2023 296  150 - 450 K/uL Final    MPV 11/17/2023 10.4  9.4 - 12.3 FL Final    nRBC 11/17/2023 0.00  0.0 - 0.2 K/uL Final    **Note: Absolute NRBC parameter is now reported with Hemogram**    Neutrophils % 11/17/2023 68.7  43.0 - 78.0 % Final    Lymphocytes % 11/17/2023 23.2  13.0 - 44.0 % Final    Monocytes % 11/17/2023 5.4  4.0 - 12.0 % Final    Eosinophils % 11/17/2023 1.9  0.5 - 7.8 % Final    Basophils % 11/17/2023 0.7  0.0 - 2.0 % Final    Immature Granulocytes % 11/17/2023 0.1  0.0 - 5.0 % Final    Neutrophils Absolute 11/17/2023 5.14  1.70 - 8.20 K/UL Final    Lymphocytes Absolute 11/17/2023 1.73  0.50 - 4.60 K/UL Final    Monocytes Absolute 11/17/2023 0.40  0.10 - 1.30 K/UL Final    Eosinophils Absolute 11/17/2023 0.14  0.00 - 0.80 K/UL Final    Basophils Absolute 11/17/2023 0.05  0.00 - 0.20 K/UL Final    Immature Granulocytes Absolute 11/17/2023 0.01  0.00 - 0.50 K/UL Final    Differential  Type 11/17/2023 AUTOMATED    Final    Sodium 11/17/2023 140  136 - 145 mmol/L Final    Potassium 11/17/2023 3.9  3.5 - 5.1 mmol/L Final    Chloride 11/17/2023 103  98 - 107 mmol/L Final    CO2 11/17/2023 25  20 - 29 mmol/L Final    Anion Gap 11/17/2023 12  7 - 16 mmol/L Final    Glucose 11/17/2023 109 (H)  70 - 99 mg/dL Final    Comment: <14 mg/dL Consistent with, but not fully diagnostic of hypoglycemia.  100 - 125 mg/dL Impaired fasting glucose/consistent with pre-diabetes mellitus.  > 126 mg/dl Fasting glucose consistent with overt diabetes mellitus      BUN 11/17/2023 17  6 - 23 MG/DL Final    Creatinine 78/29/5621 0.86  0.60 - 1.10 MG/DL Final    Est, Glom Filt Rate 11/17/2023 78  >60 ml/min/1.83m2 Final    Comment:    Pediatric calculator link: https://www.kidney.org/professionals/kdoqi/gfr_calculatorped     These  results are not intended for use in patients <89 years of age.     eGFR results are calculated without a race factor using  the 2021 CKD-EPI equation. Careful clinical correlation is recommended, particularly when comparing to results calculated using previous equations.  The CKD-EPI equation is less accurate in patients with extremes of muscle mass, extra-renal metabolism of creatinine, excessive creatine ingestion, or following therapy that affects renal tubular secretion.      Calcium 11/17/2023 9.4  8.8 - 10.2 MG/DL Final    Total Bilirubin 11/17/2023 0.4  0.0 - 1.2 MG/DL Final    ALT 43/32/9518 30  8 - 45 U/L Final    AST 11/17/2023 24  15 - 37 U/L Final    Alk Phosphatase 11/17/2023 97  35 - 104 U/L Final    Total Protein 11/17/2023 7.2  6.3 - 8.2 g/dL Final    Albumin 84/16/6063 4.0  3.5 - 5.0 g/dL Final    Globulin 01/60/1093 3.2  2.3 - 3.5 g/dL Final    Albumin/Globulin Ratio 11/17/2023 1.3  1.0 - 1.9   Final           Please refer to After Visit Summary or MyChart for upcoming appointment information. Please call our office for rescheduling needs at least 24 hours before your scheduled  appointment time.If you have any questions regarding your upcoming schedule please reach out to your care team through MyChart or call [phone].     Please notify your assigned Nurse Navigator of any unplanned hospital admissions or Emergency Room visits within 24 hours of discharge.    -------------------------------------------------------------------------------------------------------------------  Please call our office at [phone] if you have any  of the following symptoms:   Fever of 100.5 or greater  Chills  Shortness of breath  Swelling or pain in one leg    After office hours an answering service is available and will contact a provider for emergencies or if you are experiencing any of the above symptoms.        Eleonor Ocon, RN

## 2023-11-17 NOTE — Progress Notes (Signed)
 [facility] [place] [facility]: Office Visit Established Patient    Chief Complaint:    Chief Complaint   Patient presents with    Follow-up         History of Present Illness:  [name] is a 58 y.o. female who returns today for management of breast cancer.  She underwent routine mammogram in September 2019 which showed an irregular mass density in the upper outer right breast.  She underwent ultrasound and biopsy which showed invasive ductal carcinoma, grade 2, ER 95%, PR 95%, HER2 negative.  MRI showed the lesion to  be 1.5 cm with no other abnormalities.  She was referred to Dr. Millican and underwent lumpectomy and sentinel node biopsy on 05/04/18 which showed 1.6 cm of tumor with negative margins and 1 sentinel lymph node negative for tumor.  Oncotype Dx testing  was low risk at 6; therefore, chemotherapy can be safely deferred.  We will refer her for radiation therapy, then begin endocrine therapy.  She has completed hysterectomy but her ovaries are intact,  her FSH/LH/E2 levels were borderline so we felt it would be prudent to proceed with tamoxifen  for 2-3 years, then transition to AI once she is clearly post-menopausal.       History of Present Illness  The patient, a 58 year old female, presents for follow-up regarding her history of breast cancer. She is currently undergoing treatment with anastrozole  following the completion of approximately five years of tamoxifen  therapy, which was initiated during her premenopausal and perimenopausal phases.    The patient reports favorable tolerance to anastrozole , with no exacerbation of vasomotor symptoms or arthralgia. However, she describes occasional episodes of palpitations, which she associates with the medication. She denies experiencing any other adverse effects. She has completed her current prescription and requests a refill.    The patient has been advised by Dr. Odean Bend to supplement her diet with calcium and vitamin D. Although she has been unable  to obtain the specific supplements recommended, she is currently taking 1200 mg of calcium and 800 IU of vitamin D daily.        Review of Systems:  Constitutional: Negative.   HENT: Negative.   Eyes: Negative.   Respiratory: Negative.   Cardiovascular: Negative.   Gastrointestinal: Negative.   Genitourinary: Negative.   Musculoskeletal: Negative.   Skin: Negative.   Neurological: Negative.   Endo/Heme/Allergies: Negative.   Psychiatric/Behavioral: Negative.   All other systems reviewed and are negative.       Allergies   Allergen Reactions    Codeine Nausea And Vomiting and Dizziness or Vertigo     Past Medical History:   Diagnosis Date    Breast cancer (HCC)     T1cN0 right breast cancer    GERD (gastroesophageal reflux disease)     no medication at this time (noted 07/30/21)    High cholesterol     History of therapeutic radiation     Nausea & vomiting     PONV (postoperative nausea and vomiting)     Thyroid disease 06/14/2011    Hypothyroidism      Past Surgical History:   Procedure Laterality Date    BREAST BIOPSY Right 05/04/2018    RIGHT BREAST NEEDLE LOCALIZED BIOPSY performed by Hughes Maduro, MD at [facility] MAIN OR    BREAST LUMPECTOMY Right 05/04/2018    RIGHT BREAST LUMPECTOMY performed by Hughes Maduro, MD at [facility] MAIN OR    COLONOSCOPY N/A 08/09/2021    COLORECTAL CANCER SCREENING, NOT  HIGH RISK performed by Margurette Shillings, MD at [facility] ENDOSCOPY    GYN      hysterectomy partial-still has ovaries    HYSTERECTOMY (CERVIX STATUS UNKNOWN)      US  PLACE BREAST LOC DEVICE 1ST LESION RIGHT Right 05/04/2018    US  GUIDED NEEDLE LOC OF RIGHT BREAST 05/04/2018 [facility] MAMMO     Family History   Problem Relation Age of Onset    Heart Failure Mother     Heart Attack Mother         late 85's, currently in her 58's    Cancer Father         throat cancer, skin cancer     Diabetes Father     Heart Attack Father         24's, now late 47's    Breast Cancer Neg Hx      Social History     Socioeconomic History     Marital status: Married     Spouse name: Not on file    Number of children: Not on file    Years of education: Not on file    Highest education level: Not on file   Occupational History    Not on file   Tobacco Use    Smoking status: Never     Passive exposure: Never    Smokeless tobacco: Never   Vaping Use    Vaping status: Never Used   Substance and Sexual Activity    Alcohol use: Yes     Comment: ocassionally    Drug use: Not Currently    Sexual activity: Not on file   Other Topics Concern    Not on file   Social History Narrative    Not on file     Social Drivers of Health     Financial Resource Strain: Patient Declined (09/01/2021)    Overall Financial Resource Strain (CARDIA)     Difficulty of Paying Living Expenses: Patient declined   Food Insecurity: No Food Insecurity (09/29/2023)    Hunger Vital Sign     Worried About Running Out of Food in the Last Year: Never true     Ran Out of Food in the Last Year: Never true   Transportation Needs: No Transportation Needs (09/29/2023)    PRAPARE - Therapist, art (Medical): No     Lack of Transportation (Non-Medical): No   Physical Activity: Not on file   Stress: Not on file (04/16/2023)   Social Connections: Not on file   Intimate Partner Violence: Low Risk  (09/22/2019)    Received from [facility]    Intimate Partner Violence     Insults You: Not on file     Threatens You: Not on file     Screams at You: Not on file     Physically Hurt: Not on file     Intimate Partner Violence Score: Not on file   Housing Stability: Low Risk  (09/29/2023)    Housing Stability Vital Sign     Unable to Pay for Housing in the Last Year: No     Number of Times Moved in the Last Year: 0     Homeless in the Last Year: No     Current Outpatient Medications   Medication Sig Dispense Refill    anastrozole  (ARIMIDEX ) 1 MG tablet Take 1 tablet by mouth daily 90 tablet 3    levothyroxine  (SYNTHROID ) 75 MCG tablet Take  1 tablet by mouth every morning  (before breakfast) 90 tablet 0    lovastatin  (MEVACOR ) 10 MG tablet TAKE 1 TABLET BY MOUTH DAILY 100 tablet 3    Ivermectin 1 % CREA daily       No current facility-administered medications for this visit.       OBJECTIVE:  BP 120/82   Pulse 72   Temp 98 F (36.7 C) (Oral)   Resp 20   Ht 1.6 m (5' 2.99")   Wt 68.5 kg (151 lb)   SpO2 99%   BMI 26.76 kg/m   Pain Score:   0 - No pain (fatigue-0)    ECOG: 0    Physical Exam:  Constitutional: Well developed, well nourished female in no acute distress, sitting comfortably in the exam room chair.    HEENT: Normocephalic and atraumatic. Sclerae anicteric. Neck supple without JVD. No thyromegaly present.    Lymph node   No palpable submandibular, cervical, supraclavicular, or axillary lymph nodes.   Skin Warm and dry.  No bruising and no rash noted.  No erythema.  No pallor.    Respiratory Lungs are clear to auscultation bilaterally without wheezes, rales or rhonchi, normal air exchange without accessory muscle use.    CVS Normal rate, regular rhythm and normal S1 and S2.  No murmurs, gallops, or rubs.   Neuro Grossly nonfocal with no obvious sensory or motor deficits.   MSK Normal range of motion in general.  No edema and no tenderness.   Psych Appropriate mood and affect.      Labs:  Recent Results (from the past 96 hours)   CBC with Auto Differential    Collection Time: 11/17/23  2:27 PM   Result Value Ref Range    WBC 7.5 4.3 - 11.1 K/uL    RBC 4.62 4.05 - 5.2 M/uL    Hemoglobin 13.8 11.7 - 15.4 g/dL    Hematocrit 16.1 09.6 - 46.3 %    MCV 87.0 82.0 - 102.0 FL    MCH 29.9 26.1 - 32.9 PG    MCHC 34.3 31.4 - 35.0 g/dL    RDW 04.5 40.9 - 81.1 %    Platelets 296 150 - 450 K/uL    MPV 10.4 9.4 - 12.3 FL    nRBC 0.00 0.0 - 0.2 K/uL    Neutrophils % 68.7 43.0 - 78.0 %    Lymphocytes % 23.2 13.0 - 44.0 %    Monocytes % 5.4 4.0 - 12.0 %    Eosinophils % 1.9 0.5 - 7.8 %    Basophils % 0.7 0.0 - 2.0 %    Immature Granulocytes % 0.1 0.0 - 5.0 %    Neutrophils Absolute 5.14  1.70 - 8.20 K/UL    Lymphocytes Absolute 1.73 0.50 - 4.60 K/UL    Monocytes Absolute 0.40 0.10 - 1.30 K/UL    Eosinophils Absolute 0.14 0.00 - 0.80 K/UL    Basophils Absolute 0.05 0.00 - 0.20 K/UL    Immature Granulocytes Absolute 0.01 0.00 - 0.50 K/UL    Differential Type AUTOMATED     Comprehensive Metabolic Panel    Collection Time: 11/17/23  2:27 PM   Result Value Ref Range    Sodium 140 136 - 145 mmol/L    Potassium 3.9 3.5 - 5.1 mmol/L    Chloride 103 98 - 107 mmol/L    CO2 25 20 - 29 mmol/L    Anion Gap 12 7 - 16 mmol/L    Glucose 109 (  H) 70 - 99 mg/dL    BUN 17 6 - 23 MG/DL    Creatinine 4.09 8.11 - 1.10 MG/DL    Est, Glom Filt Rate 78 >60 ml/min/1.60m2    Calcium 9.4 8.8 - 10.2 MG/DL    Total Bilirubin 0.4 0.0 - 1.2 MG/DL    ALT 30 8 - 45 U/L    AST 24 15 - 37 U/L    Alk Phosphatase 97 35 - 104 U/L    Total Protein 7.2 6.3 - 8.2 g/dL    Albumin 4.0 3.5 - 5.0 g/dL    Globulin 3.2 2.3 - 3.5 g/dL    Albumin/Globulin Ratio 1.3 1.0 - 1.9         Imaging:  MRI BREAST BILATERAL W WO CONTRAST    Result Date: 10/27/2021  1. No MRI evidence of residual or recurrent breast malignancy. 2. Stable post-treatment sequelae on the right. Annual screening mammography will be due in October 2023. She may benefit from continued supplemental MRI in the future, as appropriate. BI-RADS Assessment Category 2: Benign finding.          MRI BREAST BILATERAL W WO CONTRAST    Result Date: 11/10/2020  1.  No evidence of tumor recurrence or developing mass. 2.  Small area of skin enhancement in the lateral right breast, probably reactive.  Suggest clinical correlation. BI-RADS Assessment Category 2: Benign finding.         ASSESSMENT:   Diagnosis Orders   1. Malignant neoplasm of central portion of right breast in female, estrogen receptor positive (HCC)        2. Encounter for monitoring aromatase inhibitor therapy                    PLAN:  Lab studies were personally reviewed.    Breast cancer: 1.6 cm, grade 2 IDC, sentinel lymph node  biopsy negative, ER 95%, PR 95%, HER2 negative.  S/p lumpectomy with negative margins.  She has completed definitive surgery.  Because of  the ER/PR positivity, the negative nodal assessment, and the T1 tumor, she would be an excellent candidate for Oncotype Dx for risk stratification, which was low risk at 6.  Therefore, chemotherapy can be safely deferred.  We will refer her for radiation  therapy, then begin endocrine therapy.  She has completed hysterectomy but her ovaries are intact, her FSH/LH/E2 levels were borderline so we felt it would be prudent to proceed with tamoxifen  for 2-3 years, then transition to AI once she is clearly post-menopausal.   We discussed side effects of tamoxifen  including the risk of VTE, as well as vasomotor symptoms and arthralgias.  She started tamoxifen  after radiation was completed.       Assessment & Plan  1. Breast cancer.  Currently on anastrozole  after completing approximately a 5-year course of tamoxifen  due to pre and perimenopause. Blood work results are within normal limits. MRI scan yielded satisfactory results. Palpitations are reported but are unlikely to be caused by anastrozole  as studies have not shown any heart damage or increased risk of heart attack associated with this medication. Bone density scan revealed osteopenia, marginally below the normal range. Continue the current regimen of anastrozole . Prescription for a 90-day supply of anastrozole  has been sent to the pharmacy. Orders for a mammogram and MRI have been placed for the upcoming year. Follow-up visit scheduled in 6 months with lab appointment on the same day.    Treatment plan: Continue anastrozole  therapy. The goal is  to maintain remission and prevent recurrence. Potential side effects include hot flashes and joint pains, which are currently not exacerbated. Supportive care measures include regular monitoring through blood work, MRI, and mammogram every 6 months.    Risks, benefits, and alternatives:  Anastrozole  does not increase the risk of heart damage or heart attack, unlike tamoxifen  which has a small increased risk. Switching back to tamoxifen  is an alternative if necessary, but the preference is to continue anastrozole  due to its favorable safety profile regarding cardiovascular health.    2. Osteopenia.  Bone density scan indicated osteopenia, very close to the normal range. Continue taking calcium 1200 mg and vitamin D 800 IU daily as recommended.    Follow-up: Follow-up visit scheduled in 6 months.            Alva Jewels, MD   [facility]  [address]  [place], [place] [address]  Office : [phone]  Fax : [phone]     The patient (or guardian, if applicable) and other individuals in attendance with the patient were advised that Artificial Intelligence will be utilized during this visit to record, process the conversation to generate a clinical note, and support improvement of the AI technology. The patient (or guardian, if applicable) and other individuals in attendance at the appointment consented to the use of AI, including the recording.

## 2023-12-08 ENCOUNTER — Ambulatory Visit (INDEPENDENT_AMBULATORY_CARE_PROVIDER_SITE_OTHER): Payer: Self-pay | Admitting: Internal Medicine

## 2023-12-08 ENCOUNTER — Encounter: Payer: Self-pay | Admitting: Internal Medicine

## 2023-12-08 VITALS — BP 128/86 | HR 71 | Ht 66.0 in | Wt 282.6 lb

## 2023-12-08 DIAGNOSIS — Z1231 Encounter for screening mammogram for malignant neoplasm of breast: Secondary | ICD-10-CM

## 2023-12-08 DIAGNOSIS — R110 Nausea: Secondary | ICD-10-CM | POA: Insufficient documentation

## 2023-12-08 DIAGNOSIS — R5383 Other fatigue: Secondary | ICD-10-CM

## 2023-12-08 DIAGNOSIS — E559 Vitamin D deficiency, unspecified: Secondary | ICD-10-CM | POA: Insufficient documentation

## 2023-12-08 DIAGNOSIS — Z1211 Encounter for screening for malignant neoplasm of colon: Secondary | ICD-10-CM

## 2023-12-08 DIAGNOSIS — K2101 Gastro-esophageal reflux disease with esophagitis, with bleeding: Secondary | ICD-10-CM | POA: Insufficient documentation

## 2023-12-08 DIAGNOSIS — F411 Generalized anxiety disorder: Secondary | ICD-10-CM | POA: Insufficient documentation

## 2023-12-08 DIAGNOSIS — E782 Mixed hyperlipidemia: Secondary | ICD-10-CM | POA: Diagnosis not present

## 2023-12-08 DIAGNOSIS — Z0130 Encounter for examination of blood pressure without abnormal findings: Secondary | ICD-10-CM

## 2023-12-08 MED ORDER — PANTOPRAZOLE SODIUM 40 MG PO TBEC: 40.0000 mg | DELAYED_RELEASE_TABLET | Freq: Every day | ORAL | 1 refills | Status: DC

## 2023-12-08 MED ORDER — ESCITALOPRAM OXALATE 10 MG PO TABS: 10.0000 mg | ORAL_TABLET | Freq: Every day | ORAL | 2 refills | Status: DC

## 2023-12-08 NOTE — Progress Notes (Signed)
 New Patient Office Visit  Subjective   Patient ID: Caitlin Butler, female    DOB: May 17, 1966  Age: 58 y.o. MRN: [id number]  CC:  Chief Complaint  Patient presents with   Establish Care    Re-establish care    HPI Caitlin Butler presents to establish care Previous Primary Care provider/office:   she does have additional concerns to discuss today.   Patient comes in to reestablish PMD.  Her last visit at this office was in 2022.  Patient was in general good health but recently she is having some problems.  She has history of mild hyperlipidemia and hemorrhoids but she was not on any medications on a regular basis.  Today she reports that she has been feeling nauseous for quite some time but the episodes are becoming more frequent.  She does not have any vomiting and she eats small healthy meals which she is able to digest.  She feels acid regurgitation which sometimes wakes her up at night, so she adds a pillow while sleeping.  She does not have any diarrhea or constipation or melena.  She has a history of internal hemorrhoids which prolapses sometimes, but no bleeding is seen recently. She is also feeling fatigue and gets tired after doing limited activities.  She has history of axillary lipomas.  Mentions ringing of her ears which has been going on for a while.  Patient became postmenopausal age 26.  She is overdue for a Pap, mammogram and blood work.  Her last Cologuard was in 2022.  She is due this year. Her PHQ-9/GAD-7 score is very high at 16/13.  Patient admits of feeling guilty and depression since her daughter passed away in MVA 5 years ago-she underwent some group grief counseling but did not benefit from it. Will will send referral to psychology for therapy.  Will also add Lexapro 10 mg/day.    Outpatient Encounter Medications as of 12/08/2023  Medication Sig   escitalopram (LEXAPRO) 10 MG tablet Take 1 tablet (10 mg total) by mouth daily.   pantoprazole (PROTONIX) 40 MG  tablet Take 1 tablet (40 mg total) by mouth daily.   hydrocortisone -pramoxine (PROCTOFOAM  HC) rectal foam Place 1 applicator rectally 2 (two) times daily. (Patient not taking: Reported on 12/08/2023)   No facility-administered encounter medications on file as of 12/08/2023.    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    Mixed hyperlipidemia     Past Surgical History:  Procedure Laterality Date   HERNIA REPAIR      History reviewed. No pertinent family history.  Social History   Socioeconomic History   Marital status: Divorced    Spouse name: Not on file   Number of children: Not on file   Years of education: Not on file   Highest education level: Not on file  Occupational History   Not on file  Tobacco Use   Smoking status: Every Day    Current packs/day: 0.50    Types: Cigarettes   Smokeless tobacco: Never  Substance and Sexual Activity   Alcohol use: No   Drug use: No   Sexual activity: Yes    Birth control/protection: None  Other Topics Concern   Not on file  Social History Narrative   Not on file   Social Drivers of Health   Financial Resource Strain: Not on file  Food Insecurity:  Not on file  Transportation Needs: Not on file  Physical Activity: Not on file  Stress: Not on file  Social Connections: Not on file  Intimate Partner Violence: Not on file    Review of Systems  Constitutional:  Positive for malaise/fatigue. Negative for chills, diaphoresis, fever and weight loss.  HENT: Negative.  Negative for ear discharge and sore throat.   Eyes: Negative.   Respiratory: Negative.  Negative for cough and shortness of breath.   Cardiovascular: Negative.  Negative for chest pain, palpitations and leg swelling.  Gastrointestinal: Negative.  Negative for abdominal pain, constipation, diarrhea, heartburn, nausea and vomiting.  Genitourinary: Negative.  Negative  for dysuria and flank pain.  Musculoskeletal: Negative.  Negative for joint pain and myalgias.  Skin: Negative.   Neurological: Negative.  Negative for dizziness, tingling, tremors and headaches.  Endo/Heme/Allergies: Negative.   Psychiatric/Behavioral: Negative.  Negative for depression and suicidal ideas. The patient is not nervous/anxious.         Objective   BP 128/86   Pulse 71   Ht 5' 6 (1.676 m)   Wt 282 lb 9.6 oz (128.2 kg)   LMP 05/15/2013   SpO2 98%   BMI 45.61 kg/m   Physical Exam Vitals and nursing note reviewed.  Constitutional:      Appearance: Normal appearance. She is obese.  HENT:     Head: Normocephalic and atraumatic.     Nose: Nose normal.     Mouth/Throat:     Mouth: Mucous membranes are moist.     Pharynx: Oropharynx is clear.   Eyes:     Conjunctiva/sclera: Conjunctivae normal.     Pupils: Pupils are equal, round, and reactive to light.    Cardiovascular:     Rate and Rhythm: Normal rate and regular rhythm.     Pulses: Normal pulses.     Heart sounds: Normal heart sounds. No murmur heard. Pulmonary:     Effort: Pulmonary effort is normal.     Breath sounds: Normal breath sounds. No wheezing.  Abdominal:     General: Bowel sounds are normal.     Palpations: Abdomen is soft.     Tenderness: There is no abdominal tenderness. There is no right CVA tenderness or left CVA tenderness.   Musculoskeletal:        General: Normal range of motion.     Cervical back: Normal range of motion.     Right lower leg: No edema.     Left lower leg: No edema.   Skin:    General: Skin is warm and dry.     Coloration: Skin is not jaundiced.     Findings: No bruising or lesion.   Neurological:     General: No focal deficit present.     Mental Status: She is alert and oriented to person, place, and time.   Psychiatric:        Mood and Affect: Mood normal.        Behavior: Behavior normal.        Assessment & Plan:  Patient will get fasting labs  today.  Start Lexapro and Protonix.  Order sent for mammogram and Cologuard.  Referral sent for therapy sessions and grief counseling. Patient will return for CPE with Pap and breast exam.  We discussed blood work results options for weight loss. Problem List Items Addressed This Visit     GAD (generalized anxiety disorder)   Relevant Medications   escitalopram (LEXAPRO) 10 MG tablet   Other  Relevant Orders   Ambulatory referral to Psychology   Gastroesophageal reflux disease with esophagitis and hemorrhage - Primary   Relevant Medications   pantoprazole (PROTONIX) 40 MG tablet   Mixed hyperlipidemia   Relevant Orders   CMP14+EGFR   Lipid Panel w/o Chol/HDL Ratio   Vitamin D deficiency   Relevant Orders   Vitamin D (25 hydroxy)   Nausea   Other Visit Diagnoses       Breast cancer screening by mammogram       Relevant Orders   MM 3D SCREENING MAMMOGRAM BILATERAL BREAST     Colon cancer screening       Relevant Orders   Cologuard     Other fatigue       Relevant Orders   CBC with Diff   TSH+T4F+T3Free       Return in about 1 week (around 12/15/2023).   Total time spent: 30 minutes  FERNAND FREDY RAMAN, MD  12/08/2023   This document may have been prepared by [facility] Voice Recognition software and as such may include unintentional dictation errors.

## 2023-12-09 LAB — CBC WITH DIFFERENTIAL/PLATELET
Basophils Absolute: 0 10*3/uL (ref 0.0–0.2)
Basos: 1 %
EOS (ABSOLUTE): 0.2 10*3/uL (ref 0.0–0.4)
Eos: 3 %
Hematocrit: 41 % (ref 34.0–46.6)
Hemoglobin: 13.6 g/dL (ref 11.1–15.9)
Immature Grans (Abs): 0 10*3/uL (ref 0.0–0.1)
Immature Granulocytes: 0 %
Lymphocytes Absolute: 1.7 10*3/uL (ref 0.7–3.1)
Lymphs: 29 %
MCH: 30 pg (ref 26.6–33.0)
MCHC: 33.2 g/dL (ref 31.5–35.7)
MCV: 90 fL (ref 79–97)
Monocytes Absolute: 0.5 10*3/uL (ref 0.1–0.9)
Monocytes: 9 %
Neutrophils Absolute: 3.4 10*3/uL (ref 1.4–7.0)
Neutrophils: 58 %
Platelets: 242 10*3/uL (ref 150–450)
RBC: 4.54 x10E6/uL (ref 3.77–5.28)
RDW: 13 % (ref 11.7–15.4)
WBC: 5.8 10*3/uL (ref 3.4–10.8)

## 2023-12-09 LAB — CMP14+EGFR
ALT: 21 IU/L (ref 0–32)
AST: 27 IU/L (ref 0–40)
Albumin: 4.5 g/dL (ref 3.8–4.9)
Alkaline Phosphatase: 65 IU/L (ref 44–121)
BUN/Creatinine Ratio: 12 (ref 9–23)
BUN: 11 mg/dL (ref 6–24)
Bilirubin Total: 0.3 mg/dL (ref 0.0–1.2)
CO2: 22 mmol/L (ref 20–29)
Calcium: 9.6 mg/dL (ref 8.7–10.2)
Chloride: 103 mmol/L (ref 96–106)
Creatinine, Ser: 0.91 mg/dL (ref 0.57–1.00)
Globulin, Total: 3 g/dL (ref 1.5–4.5)
Glucose: 87 mg/dL (ref 70–99)
Potassium: 4.3 mmol/L (ref 3.5–5.2)
Sodium: 140 mmol/L (ref 134–144)
Total Protein: 7.5 g/dL (ref 6.0–8.5)
eGFR: 73 mL/min/{1.73_m2} (ref >59)

## 2023-12-09 LAB — LIPID PANEL W/O CHOL/HDL RATIO
Cholesterol, Total: 232 mg/dL — ABNORMAL HIGH (ref 100–199)
HDL: 68 mg/dL (ref >39)
LDL Chol Calc (NIH): 149 mg/dL — ABNORMAL HIGH (ref 0–99)
Triglycerides: 89 mg/dL (ref 0–149)
VLDL Cholesterol Cal: 15 mg/dL (ref 5–40)

## 2023-12-09 LAB — TSH+T4F+T3FREE
Free T4: 1.12 ng/dL (ref 0.82–1.77)
T3, Free: 2.7 pg/mL (ref 2.0–4.4)
TSH: 3.16 u[IU]/mL (ref 0.450–4.500)

## 2023-12-09 LAB — VITAMIN D 25 HYDROXY (VIT D DEFICIENCY, FRACTURES): Vit D, 25-Hydroxy: 16.7 ng/mL — ABNORMAL LOW (ref 30.0–100.0)

## 2023-12-11 ENCOUNTER — Ambulatory Visit: Payer: Self-pay | Admitting: Internal Medicine

## 2023-12-14 ENCOUNTER — Ambulatory Visit (INDEPENDENT_AMBULATORY_CARE_PROVIDER_SITE_OTHER): Admitting: Internal Medicine

## 2023-12-14 ENCOUNTER — Encounter: Payer: Self-pay | Admitting: Internal Medicine

## 2023-12-14 ENCOUNTER — Ambulatory Visit: Payer: Self-pay | Admitting: Internal Medicine

## 2023-12-14 VITALS — BP 126/86 | HR 70 | Ht 66.0 in | Wt 282.0 lb

## 2023-12-14 DIAGNOSIS — R002 Palpitations: Secondary | ICD-10-CM

## 2023-12-14 DIAGNOSIS — K2101 Gastro-esophageal reflux disease with esophagitis, with bleeding: Secondary | ICD-10-CM

## 2023-12-14 DIAGNOSIS — Z0130 Encounter for examination of blood pressure without abnormal findings: Secondary | ICD-10-CM | POA: Diagnosis not present

## 2023-12-14 DIAGNOSIS — R110 Nausea: Secondary | ICD-10-CM

## 2023-12-14 DIAGNOSIS — R350 Frequency of micturition: Secondary | ICD-10-CM | POA: Diagnosis not present

## 2023-12-14 DIAGNOSIS — Z0001 Encounter for general adult medical examination with abnormal findings: Secondary | ICD-10-CM | POA: Diagnosis not present

## 2023-12-14 DIAGNOSIS — L989 Disorder of the skin and subcutaneous tissue, unspecified: Secondary | ICD-10-CM

## 2023-12-14 DIAGNOSIS — Z124 Encounter for screening for malignant neoplasm of cervix: Secondary | ICD-10-CM

## 2023-12-14 DIAGNOSIS — E559 Vitamin D deficiency, unspecified: Secondary | ICD-10-CM

## 2023-12-14 DIAGNOSIS — E782 Mixed hyperlipidemia: Secondary | ICD-10-CM

## 2023-12-14 DIAGNOSIS — Z1151 Encounter for screening for human papillomavirus (HPV): Secondary | ICD-10-CM

## 2023-12-14 DIAGNOSIS — R87612 Low grade squamous intraepithelial lesion on cytologic smear of cervix (LGSIL): Secondary | ICD-10-CM

## 2023-12-14 DIAGNOSIS — R001 Bradycardia, unspecified: Secondary | ICD-10-CM

## 2023-12-14 DIAGNOSIS — Z1272 Encounter for screening for malignant neoplasm of vagina: Secondary | ICD-10-CM

## 2023-12-14 LAB — POCT URINALYSIS DIPSTICK
Bilirubin, UA: NEGATIVE
Blood, UA: NEGATIVE
Glucose, UA: NEGATIVE
Ketones, UA: NEGATIVE
Leukocytes, UA: NEGATIVE
Nitrite, UA: NEGATIVE
Protein, UA: NEGATIVE
Spec Grav, UA: 1.01 (ref 1.010–1.025)
Urobilinogen, UA: 0.2 U/dL
pH, UA: 5.5 (ref 5.0–8.0)

## 2023-12-14 MED ORDER — VITAMIN D3 1.25 MG (50000 UT) PO CAPS: 1.0000 | ORAL_CAPSULE | ORAL | 3 refills | Status: AC

## 2023-12-14 MED ORDER — ROSUVASTATIN CALCIUM 10 MG PO TABS: 10.0000 mg | ORAL_TABLET | Freq: Every day | ORAL | 11 refills | Status: DC

## 2023-12-14 MED ORDER — FAMOTIDINE 20 MG PO TABS: 20.0000 mg | ORAL_TABLET | Freq: Two times a day (BID) | ORAL | 1 refills | Status: DC

## 2023-12-14 NOTE — Progress Notes (Signed)
 Established Patient Office Visit  Subjective:  Patient ID: Caitlin Butler, female    DOB: [birth year]  Age: 58 y.o. MRN: [id number]  Chief Complaint  Patient presents with   Follow-up    PAP    Patient comes in for follow-up and also needs her Pap and breast exam today.  She has started taking Lexapro and notes a significant improvement.  However she did not start the Protonix, got scared from the side effects.  But she continues to have intermittent nausea several times during the day and suddenly starts to feel weak and tired.  Will schedule an abdominal ultrasound and send in a prescription for famotidine to be used instead of Protonix.  Denies diarrhea or constipation, no melena.  She did receive her Cologuard box and will return at next week. Patient notes a small growth on the skin of her right pubic area, looks like a skin tag would like it to be removed.  Will get evaluated by the OB/GYN.  Mammogram has been scheduled already.. Her labs were done recently which showed elevated LDL and low vitamin D. Will start her on a small dose of Crestor and vitamin D supplement. Her EKG done today shows sinus bradycardia nonspecific ST-T changes.  Will schedule an echocardiogram and set up a cardiology consultation.    No other concerns at this time.   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    Mixed hyperlipidemia     Past Surgical History:  Procedure Laterality Date   HERNIA REPAIR      Social History   Socioeconomic History   Marital status: Divorced    Spouse name: Not on file   Number of children: Not on file   Years of education: Not on file   Highest education level: Not on file  Occupational History   Not on file  Tobacco Use   Smoking status: Every Day    Current packs/day: 0.50    Types: Cigarettes   Smokeless tobacco: Never  Substance and Sexual Activity    Alcohol use: No   Drug use: No   Sexual activity: Yes    Birth control/protection: None  Other Topics Concern   Not on file  Social History Narrative   Not on file   Social Drivers of Health   Financial Resource Strain: Not on file  Food Insecurity: Not on file  Transportation Needs: Not on file  Physical Activity: Not on file  Stress: Not on file  Social Connections: Not on file  Intimate Partner Violence: Not on file    History reviewed. No pertinent family history.  Allergies  Allergen Reactions   Penicillins     Childhood rxn     Outpatient Medications Prior to Visit  Medication Sig   escitalopram (LEXAPRO) 10 MG tablet Take 1 tablet (10 mg total) by mouth daily.   hydrocortisone -pramoxine (PROCTOFOAM  HC) rectal foam Place 1 applicator rectally 2 (two) times daily. (Patient not taking: Reported on 12/14/2023)   [DISCONTINUED] pantoprazole (PROTONIX) 40 MG tablet Take 1 tablet (40 mg total) by mouth daily. (Patient not taking: Reported on 12/14/2023)   No facility-administered medications prior to visit.    Review of Systems  Constitutional:  Positive for malaise/fatigue. Negative for chills, diaphoresis, fever and weight loss.  HENT: Negative.  Negative for ear discharge, nosebleeds and sore throat.   Eyes: Negative.  Respiratory: Negative.  Negative for cough, shortness of breath and stridor.   Cardiovascular: Negative.  Negative for chest pain, palpitations and leg swelling.  Gastrointestinal:  Positive for nausea. Negative for abdominal pain, constipation, diarrhea, heartburn and vomiting.  Genitourinary: Negative.  Negative for dysuria and flank pain.  Musculoskeletal: Negative.  Negative for joint pain and myalgias.  Skin: Negative.   Neurological:  Positive for weakness. Negative for dizziness, tingling, tremors, loss of consciousness and headaches.  Endo/Heme/Allergies: Negative.   Psychiatric/Behavioral:  Negative for depression and suicidal ideas. The  patient is nervous/anxious.        Objective:   BP 126/86   Pulse 70   Ht 5' 6 (1.676 m)   Wt 282 lb (127.9 kg)   LMP 05/15/2013   SpO2 98%   BMI 45.52 kg/m   Vitals:   12/14/23 1436  BP: 126/86  Pulse: 70  Height: 5' 6 (1.676 m)  Weight: 282 lb (127.9 kg)  SpO2: 98%  BMI (Calculated): 45.54    Physical Exam Vitals and nursing note reviewed. Exam conducted with a chaperone present.  Constitutional:      Appearance: Normal appearance.  HENT:     Head: Normocephalic and atraumatic.     Nose: Nose normal.     Mouth/Throat:     Mouth: Mucous membranes are moist.     Pharynx: Oropharynx is clear.  Eyes:     Conjunctiva/sclera: Conjunctivae normal.     Pupils: Pupils are equal, round, and reactive to light.  Cardiovascular:     Rate and Rhythm: Normal rate and regular rhythm.     Pulses: Normal pulses.     Heart sounds: Normal heart sounds. No murmur heard. Pulmonary:     Effort: Pulmonary effort is normal.     Breath sounds: Normal breath sounds. No wheezing.  Chest:  Breasts:    Right: Normal. No swelling, bleeding, inverted nipple, mass, nipple discharge, skin change or tenderness.     Left: Normal. No swelling, bleeding, inverted nipple, mass, nipple discharge, skin change or tenderness.     Comments: Patient has a small lipoma in right axilla. Abdominal:     General: Bowel sounds are normal.     Palpations: Abdomen is soft.     Tenderness: There is no abdominal tenderness. There is no right CVA tenderness or left CVA tenderness.     Hernia: There is no hernia in the left inguinal area or right inguinal area.  Genitourinary:    General: Normal vulva.     Pubic Area: No rash or pubic lice.      Labia:        Right: No rash, tenderness, lesion or injury.        Left: No rash, tenderness, lesion or injury.      Urethra: No prolapse.     Vagina: Normal. No signs of injury and foreign body. No vaginal discharge, erythema, tenderness, bleeding, lesions or  prolapsed vaginal walls.     Cervix: Normal.     Uterus: Normal.      Adnexa: Right adnexa normal and left adnexa normal.       Right: No mass, tenderness or fullness.         Left: No mass, tenderness or fullness.       Comments: Small skin tag looking lesion over right pubic area. Musculoskeletal:        General: Normal range of motion.     Cervical back: Normal range of motion.  Right lower leg: No edema.     Left lower leg: No edema.  Lymphadenopathy:     Upper Body:     Right upper body: No supraclavicular, axillary or pectoral adenopathy.     Left upper body: No supraclavicular, axillary or pectoral adenopathy.     Lower Body: No right inguinal adenopathy. No left inguinal adenopathy.  Skin:    General: Skin is warm and dry.  Neurological:     General: No focal deficit present.     Mental Status: She is alert and oriented to person, place, and time.  Psychiatric:        Mood and Affect: Mood normal.        Behavior: Behavior normal.      Results for orders placed or performed in visit on 12/14/23  POCT Urinalysis Dipstick (81002)  Result Value Ref Range   Color, UA Clear    Clarity, UA Clear    Glucose, UA Negative Negative   Bilirubin, UA Negative    Ketones, UA Negative    Spec Grav, UA 1.010 1.010 - 1.025   Blood, UA Negative    pH, UA 5.5 5.0 - 8.0   Protein, UA Negative Negative   Urobilinogen, UA 0.2 0.2 or 1.0 E.U./dL   Nitrite, UA Negative    Leukocytes, UA Negative Negative   Appearance Clear    Odor No     Recent Results (from the past 2160 hours)  CMP14+EGFR     Status: None   Collection Time: 12/08/23  2:19 PM  Result Value Ref Range   Glucose 87 70 - 99 mg/dL   BUN 11 6 - 24 mg/dL   Creatinine, Ser 9.08 0.57 - 1.00 mg/dL   eGFR 73 >40 fO/fpw/8.26   BUN/Creatinine Ratio 12 9 - 23   Sodium 140 134 - 144 mmol/L   Potassium 4.3 3.5 - 5.2 mmol/L   Chloride 103 96 - 106 mmol/L   CO2 22 20 - 29 mmol/L   Calcium 9.6 8.7 - 10.2 mg/dL   Total  Protein 7.5 6.0 - 8.5 g/dL   Albumin 4.5 3.8 - 4.9 g/dL   Globulin, Total 3.0 1.5 - 4.5 g/dL   Bilirubin Total 0.3 0.0 - 1.2 mg/dL   Alkaline Phosphatase 65 44 - 121 IU/L   AST 27 0 - 40 IU/L   ALT 21 0 - 32 IU/L  CBC with Diff     Status: None   Collection Time: 12/08/23  2:19 PM  Result Value Ref Range   WBC 5.8 3.4 - 10.8 x10E3/uL   RBC 4.54 3.77 - 5.28 x10E6/uL   Hemoglobin 13.6 11.1 - 15.9 g/dL   Hematocrit 58.9 65.9 - 46.6 %   MCV 90 79 - 97 fL   MCH 30.0 26.6 - 33.0 pg   MCHC 33.2 31.5 - 35.7 g/dL   RDW 86.9 88.2 - 84.5 %   Platelets 242 150 - 450 x10E3/uL   Neutrophils 58 Not Estab. %   Lymphs 29 Not Estab. %   Monocytes 9 Not Estab. %   Eos 3 Not Estab. %   Basos 1 Not Estab. %   Neutrophils Absolute 3.4 1.4 - 7.0 x10E3/uL   Lymphocytes Absolute 1.7 0.7 - 3.1 x10E3/uL   Monocytes Absolute 0.5 0.1 - 0.9 x10E3/uL   EOS (ABSOLUTE) 0.2 0.0 - 0.4 x10E3/uL   Basophils Absolute 0.0 0.0 - 0.2 x10E3/uL   Immature Granulocytes 0 Not Estab. %   Immature Grans (Abs) 0.0 0.0 -  0.1 x10E3/uL  Lipid Panel w/o Chol/HDL Ratio     Status: Abnormal   Collection Time: 12/08/23  2:19 PM  Result Value Ref Range   Cholesterol, Total 232 (H) 100 - 199 mg/dL   Triglycerides 89 0 - 149 mg/dL   HDL 68 >60 mg/dL   VLDL Cholesterol Cal 15 5 - 40 mg/dL   LDL Chol Calc (NIH) 850 (H) 0 - 99 mg/dL  Vitamin D (25 hydroxy)     Status: Abnormal   Collection Time: 12/08/23  2:19 PM  Result Value Ref Range   Vit D, 25-Hydroxy 16.7 (L) 30.0 - 100.0 ng/mL    Comment: Vitamin D deficiency has been defined by the Institute of Medicine and an Endocrine Society practice guideline as a level of serum 25-OH vitamin D less than 20 ng/mL (1,2). The Endocrine Society went on to further define vitamin D insufficiency as a level between 21 and 29 ng/mL (2). 1. IOM (Institute of Medicine). 2010. Dietary reference    intakes for calcium and D. [place]: The    [employer]. 2. Holick MF,  Binkley [place], Bischoff-Ferrari HA, et al.    Evaluation, treatment, and prevention of vitamin D    deficiency: an Endocrine Society clinical practice    guideline. JCEM. 2011 Jul; 96(7):1911-30.   [id number]     Status: None   Collection Time: 12/08/23  2:19 PM  Result Value Ref Range   TSH 3.160 0.450 - 4.500 uIU/mL   T3, Free 2.7 2.0 - 4.4 pg/mL   Free T4 1.12 0.82 - 1.77 ng/dL  POCT Urinalysis Dipstick (18997)     Status: None   Collection Time: 12/14/23  3:00 PM  Result Value Ref Range   Color, UA Clear    Clarity, UA Clear    Glucose, UA Negative Negative   Bilirubin, UA Negative    Ketones, UA Negative    Spec Grav, UA 1.010 1.010 - 1.025   Blood, UA Negative    pH, UA 5.5 5.0 - 8.0   Protein, UA Negative Negative   Urobilinogen, UA 0.2 0.2 or 1.0 E.U./dL   Nitrite, UA Negative    Leukocytes, UA Negative Negative   Appearance Clear    Odor No       Assessment & Plan:  Patient to continue medications.  Add Crestor and vitamin D supplement.  Schedule echocardiogram and cardiology referral. OB/GYN to evaluate the lesion over the right pubic area. Abdominal ultrasound for persistent nausea. Problem List Items Addressed This Visit     Gastroesophageal reflux disease with esophagitis and hemorrhage   Relevant Medications   famotidine (PEPCID) 20 MG tablet   Mixed hyperlipidemia   Relevant Medications   rosuvastatin (CRESTOR) 10 MG tablet   Vitamin D deficiency   Relevant Medications   Cholecalciferol (VITAMIN D3) 1.25 MG (50000 UT) CAPS   Nausea   Relevant Orders   US  Abdomen Limited RUQ (LIVER/GB)   Other Visit Diagnoses       Urinary frequency    -  Primary   Relevant Orders   POCT Urinalysis Dipstick (18997) (Completed)     Enlarging skin lesion       Relevant Orders   Ambulatory referral to Obstetrics / Gynecology     Vaginal Pap smear       Relevant Orders   IGP, Aptima HPV     Screening for cervical cancer       Relevant Orders   IGP, Aptima HPV  Screening for human papillomavirus (HPV)       Relevant Orders   IGP, Aptima HPV     Palpitations       Relevant Orders   PCV ECHOCARDIOGRAM COMPLETE   EKG 12-Lead     Sinus bradycardia       Relevant Medications   rosuvastatin (CRESTOR) 10 MG tablet   Other Relevant Orders   Ambulatory referral to Cardiology       Return in about 1 month (around 01/14/2024).   Total time spent: 30 minutes  FERNAND FREDY RAMAN, MD  12/14/2023   This document may have been prepared by [facility] Voice Recognition software and as such may include unintentional dictation errors.

## 2023-12-18 ENCOUNTER — Encounter

## 2023-12-18 ENCOUNTER — Telehealth: Payer: Self-pay

## 2023-12-18 NOTE — Telephone Encounter (Signed)
 Pt called to inform that when she tried to make a mammogram appointment they said she needs a referral to get a bilateral diagnostic mammogram and a left breast ultrasound. Please advise.

## 2023-12-19 ENCOUNTER — Ambulatory Visit
Admission: RE | Admit: 2023-12-19 | Discharge: 2023-12-19 | Disposition: A | Discharge status: Home / Self Care | Source: Ambulatory Visit | Attending: Internal Medicine | Admitting: Internal Medicine

## 2023-12-19 DIAGNOSIS — R110 Nausea: Secondary | ICD-10-CM | POA: Insufficient documentation

## 2023-12-19 LAB — IGP, APTIMA HPV
HPV Aptima: NEGATIVE
PAP Smear Comment: 0

## 2023-12-20 ENCOUNTER — Other Ambulatory Visit: Payer: Self-pay

## 2023-12-20 NOTE — Progress Notes (Signed)
Pt informed

## 2023-12-23 LAB — COLOGUARD: COLOGUARD: NEGATIVE

## 2023-12-25 NOTE — Progress Notes (Signed)
 Patient notified

## 2023-12-28 ENCOUNTER — Inpatient Hospital Stay: Admit: 2023-12-28 | Payer: PRIVATE HEALTH INSURANCE | Primary: Family Medicine

## 2023-12-28 DIAGNOSIS — M79672 Pain in left foot: Principal | ICD-10-CM

## 2023-12-28 DIAGNOSIS — M79671 Pain in right foot: Principal | ICD-10-CM

## 2023-12-28 NOTE — Other (Signed)
 Jill Kaufman  DOB: 03-Apr-1966  Primary: Jill Kaufman (Commercial)  Secondary: [employer] [place] LOC[place] [facility]  [address]  [place] [address]  Phone: [phone]  Fax: [phone] Plan Frequency: 2x a week for up to 90 days (potentially 1x a week for up to 90 days for pt preference)    Plan of Care/Certification Expiration Date: 03/27/24        Plan of Care/Certification Expiration Date:  Plan of Care/Certification Expiration Date: 03/27/24    Frequency/Duration: Plan Frequency: 2x a week for up to 90 days (potentially 1x a week for up to 90 days for pt preference)      Time In/Out:   Time In: 1538      PT Visit Info:    Progress Note Counter: 1      Visit Count:  1                OUTPATIENT PHYSICAL THERAPY:             Initial Assessment 12/28/2023               Episode (BLE foot pain )         Treatment Diagnosis:     Pain in both feet  Medical/Referring Diagnosis:    Pain of both heels    Referring Physician:  Ethyl Elspeth HERO, MD    MD Orders:  PT Eval and Treat   Return MD Appt:    Date of Onset:      Allergies:  Codeine  Restrictions/Precautions:    None      Medications Last Reviewed: 12/28/2023     SUBJECTIVE   History of Injury/Illness (Reason for Referral):  Pt arrives today with BLE foot/ankle pain in both heels. Pt reports that her LLE foot pain > RLE foot pain. Pt reports that sedentary behavior causes her to have a lot of heel pain. Pt reports that walking prolonged distances also gives her a decent pain. Pt reports that her pain is in the posterior-medial aspect of the LLE>RLE. Pt reports some medial plantar fascia pain that does get exacerbated with prolonged WB.  Pt reports that her ADLs are minimally affected, she can perform most home duties without issues however does feel pain. Pt denies any numbness and tingling. Pt denies swelling. Pt denies any MOI or trauma that is related to this initial incident.     Patient Stated  Goal(s):  would like to move w/o BLE foot pain    Initial Pain Level:  2/10   Post Session Pain Level: 2/10    Past Medical History/Comorbidities:   Jill Kaufman  has a past medical history of Breast cancer (HCC), GERD (gastroesophageal reflux disease), High cholesterol, History of therapeutic radiation, Nausea & vomiting, PONV (postoperative nausea and vomiting), and Thyroid disease.  Jill Kaufman  has a past surgical history that includes gyn; Breast lumpectomy (Right, 05/04/2018); Breast biopsy (Right, 05/04/2018); US  PLACE BREAST LOC DEVICE 1ST LESION RIGHT (Right, 05/04/2018); Hysterectomy; and Colonoscopy (N/A, 08/09/2021).  Social History/Living Environment:   Patient lives with their family    Prior Level of Function/Work/Activity:   Prior Level of Function: Independent      Learning:   Does the patient/guardian have any barriers to learning?: No barriers  Will there be a co-learner?: No  What is the preferred language of the patient/guardian?: English  Is an interpreter required?: No  How does the patient/guardian prefer to learn  new concepts?: Listening; Demonstration    Fall Risk Scale:   Morse Total Score: 0       OBJECTIVE     NATURE OF CONDITION Date: 12/28/23 Date:    Highest level of pain 7    Lowest level of pain 2    Aggravating factors Activity use/WB     Alleviating factors rest    Frequency of symptoms Everyday     Description of symptoms Soreness/ache    Location of tenderness Plantar aspect BLE heels      RANGE OF MOTION Date: 12/28/23 Date:      RIGHT LEFT RIGHT LEFT   Ankle Eversion  15 21     Ankle Inversion 25 15     Ankle Dorsiflexion 15 11     Ankle Plantarflexion WNL WNL       Strength Date: 12/28/23 Date:      RIGHT LEFT RIGHT LEFT   Ankle Eversion  3- 3-     Ankle Inversion 3- 3-     Ankle Dorsiflexion 3- 3-     Ankle Plantarflexion 3- 3-          NEUROLOGICAL SCREEN  Dermatomes: WNL  Reflexes: WNL   Outcome Measure:   Tool Used: FOOT AND ANKLE ABILITY MEASURE  Score:  Initial: 57 Most  Recent: X (Date: -- )   Interpretation of Score: For the Activities of Daily Living, there are 21 questions each scored on a 5 point scale with 0 representing Unable to do and 4 representing No difficulty.  The lower the score, the greater the functional disability. 84/84 represents no disability.  Minimal detectable change is 5.7 points.  With the addition of the 8 questions in the Sports Subscale, there are 29 questions, each scored on a 5 point scale with 0 representing Unable to do and 4 representing No difficulty.  The lower the score, the greater the functional disability. 116/116 represents no disability.  Minimal detectable change is 12.3 points.    ASSESSMENT   Initial Assessment:  Pt is a 58 yo female who presents to the OPPT clinic with BLE foot/ankle pain. Pt has received Dx imaging via xray which revealed, Calcaneal spur and minimal degenerative change in the first toe MTP joint. Otherwise unremarkable foot radiographs. 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  Pt would benefit from skilled PT in order to address her impairments.     Therapy Problem List: (Impacting functional limitations):    Increased Pain, Decreased Strength, Decreased ROM, Decreased Functional Mobility, Decreased Independence with Home Exercise Program, Decreased Posture, Decreased Body Mechanics, and Decreased Activity Tolerance/Endurance*   Therapy Prognosis:   Good     Initial Assessment Complexity:   Moderate Complexity       PLAN   Effective Dates: 12/28/2023 TO Plan of Care/Certification Expiration Date: 03/27/24     Frequency/Duration: Plan Frequency: 2x a week for up to 90 days (potentially 1x a week for up to 90 days for pt preference)      Interventions Planned (Treatment may consist of any combination of the following):    Home Exercise Program (HEP), Manual Therapy, Neuromuscular Re-education/Strengthening, Pain  Management, Positioning, Range of Motion (ROM), Therapeutic Activites, and Therapeutic Exercise/Strengthening   Goals: (Goals have been discussed and agreed upon with patient.)  Short-Term Functional Goals: Time Frame: 4 weeks   Pt will achieve a 65/116 FAAM score in order to improve ADLs and function  Pt will be compliant with HEP   Pt will achieve a VAS pain score of 5/10 in order to improve ADLS and function   Pt will improve all BLE Strength to 4-/5 in order to improve ADLs and function   Discharge Goals: Time Frame: 8 weeks   Pt will achieve a 75/116 FAAM score in order to improve ADLs and function   Pt will be independent with HEP   Pt will achieve a VAS pain score of 0/10 in order to improve ADLS and function   Pt will improve all BLE Strength to 5/5 in order to improve ADLs and function          Medical Necessity:   > Skilled intervention continues to be required due to Skilled Physical Therapy services are needed for the pt's deficits above that affect the pt to perform ADLs, participate within their community, and to perform functional movement patterns.    Reason For Services/Other Comments:  > Patient continues to require skilled intervention due to Skilled Physical Therapy services are needed for the pt's deficits above that affect the pt to perform ADLs, participate within their community, and to perform functional movement patterns.      Regarding Ciearra Thornton Avallone's therapy, I certify that the treatment plan above will be carried out by a therapist or under their direction.  Thank you for this referral,  Cathlyn Searing, PT     Referring Physician Signature: Jill Elspeth HERO, MD _______________________________ Date _____________        Charge Capture  Events  Appt Desk  Attendance Report

## 2023-12-28 NOTE — Progress Notes (Signed)
 Jill Kaufman  DOB: 12-01-1965  Primary: [employer] (Commercial)  Secondary: [employer] [place] [facility]  [address]  [place] [address]  Phone: [phone]  Fax: [phone] Plan Frequency: 2x a week for up to 90 days (potentially 1x a week for up to 90 days for pt preference)  Plan of Care/Certification Expiration Date: 03/27/24        Plan of Care/Certification Expiration Date:  Plan of Care/Certification Expiration Date: 03/27/24    Frequency/Duration: Plan Frequency: 2x a week for up to 90 days (potentially 1x a week for up to 90 days for pt preference)      Time In/Out:   Time In: 1538  Time Out: 1618      PT Visit Info:    Progress Note Counter: 1      Visit Count:  1    OUTPATIENT PHYSICAL THERAPY:   Treatment Note 12/28/2023       Episode  (BLE foot pain )               Treatment Diagnosis:    Pain in both feet  Medical/Referring Diagnosis:    Pain of both heels    Referring Physician:  Ethyl Elspeth HERO, MD  MD Orders:  PT Eval and Treat   Return MD Appt:     Date of Onset:  No data recorded   Allergies:   Codeine  Restrictions/Precautions:   None      Interventions Planned (Treatment may consist of any combination of the following):     See Assessment Note    Subjective Comments:   See eval     Initial Pain Level: 2/10  Post Session Pain Level: 2/10    Medications Last Reviewed: 12/28/2023  Updated Objective Findings:  None Today  Treatment   THERAPEUTIC EXERCISE: (23 minutes):    Exercises per grid below to improve mobility, strength, balance and coordination.  Required visual, verbal, manual and tactile cues to promote proper body alignment, promote proper body posture, promote proper body mechanics and promote proper body breathing techniques.  Progressed resistance, range, repetitions and complexity of movement as indicated.   Date:  12/28/23 Date:   Date:     Activity/Exercise Parameters Parameters Parameters   Pt ed on  biomechanics, PT POC, HEP, outcomes, prognosis, BLE strengthening education, BLE ROM education, ankle/foot differential dx of DJD, plantar fascia tightness, talocrural joint dysfunction, posterior tibial tendon dysfunction, TA weakness dysfunction, PF weakness dysfunction, toe intrinsic weakness, Achilles tendinopathy, calcaneal heel spur education, fallen arch education, windlass mechanism dysfunction, stress fx, meta tarsal bone fx education, neurogenic pain syndrome, diabetes polyneuropathy education, red flag s/s of stress fx, soft tissue ligamentous ruptures   23 mins                               THERAPEUTIC ACTIVITY: (  minutes):    Therapeutic activities per grid below to improve mobility, strength, balance and coordination.Required visual, verbal, manual and tactile cues to promote motor control of bilateral, lower extremity(s).   Date:   Date:   Date:     Activity/Exercise Parameters Parameters Parameters                       NEUROMUSCULAR RE-EDUCATION: ( minutes):    Exercise/activities per grid below to improve balance, coordination, kinesthetic sense, posture and proprioception.  Required visual,  verbal, manual and tactile cues to promote motor control of bilateral, lower extremity(s).   Date:   Date:   Date:     Activity/Exercise Parameters Parameters Parameters                       MANUAL THERAPY: ( 5 minutes):   Joint mobilization, Soft tissue mobilization, Manipulation and Manual lymphatic drainage was utilized and necessary because of the patient's restricted joint motion, painful spasm, loss of articular motion and restricted motion of soft tissue.    Date:   Date:   Date:     Activity/Exercise Parameters Parameters Parameters   Supine BLE Foot IASTM plantar fascia  5 mIns                    MODALITIES: ( minutes):         Therapy in order to provide analgesia, relieve muscle spasm and reduce inflammation and edema.            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 Will progress prn     Communication/Consultation:  None today  Equipment provided today:  None  Recommendations/Intent for next treatment session: Next visit will focus on BLE strengthening, BLE ROM and WB progressions/stamina training.    >Total Treatment Billable Duration:  28 minutes + eval non-timed  Time In: 1538  Time Out: 1618     Cathlyn Searing, PT         Charge Capture  Events  [employer] Portal  Appt Desk  Attendance Report     Future Appointments   Date Time Provider Department Center   01/15/2024  9:30 AM Searing Cathlyn, PT [facility]   03/29/2024  8:30 AM PST LAB PST [facility] DE[facility]   04/05/2024  8:30 AM Ethyl Elspeth HERO, MD PST [facility] DEP   05/17/2024  1:00 PM [facility]   05/17/2024  2:00 PM Fermin Maryelizabeth HERO, APRN - CNP [facility]   11/22/2024  1:00 PM [facility]   11/22/2024  2:00 PM Mertha Garnette DEL, MD [facility]

## 2024-01-03 ENCOUNTER — Ambulatory Visit (INDEPENDENT_AMBULATORY_CARE_PROVIDER_SITE_OTHER)

## 2024-01-03 DIAGNOSIS — I340 Nonrheumatic mitral (valve) insufficiency: Secondary | ICD-10-CM

## 2024-01-03 DIAGNOSIS — I371 Nonrheumatic pulmonary valve insufficiency: Secondary | ICD-10-CM

## 2024-01-03 DIAGNOSIS — R002 Palpitations: Secondary | ICD-10-CM

## 2024-01-03 DIAGNOSIS — I361 Nonrheumatic tricuspid (valve) insufficiency: Secondary | ICD-10-CM | POA: Diagnosis not present

## 2024-01-05 ENCOUNTER — Ambulatory Visit: Admitting: Licensed Clinical Social Worker

## 2024-01-05 ENCOUNTER — Ambulatory Visit: Admitting: Cardiovascular Disease

## 2024-01-05 ENCOUNTER — Telehealth: Payer: Self-pay | Admitting: Internal Medicine

## 2024-01-05 ENCOUNTER — Encounter: Payer: Self-pay | Admitting: Cardiovascular Disease

## 2024-01-05 VITALS — BP 130/80 | HR 88 | Ht 66.0 in | Wt 276.8 lb

## 2024-01-05 DIAGNOSIS — F411 Generalized anxiety disorder: Secondary | ICD-10-CM

## 2024-01-05 DIAGNOSIS — R9431 Abnormal electrocardiogram [ECG] [EKG]: Secondary | ICD-10-CM

## 2024-01-05 DIAGNOSIS — R110 Nausea: Secondary | ICD-10-CM | POA: Diagnosis not present

## 2024-01-05 DIAGNOSIS — F331 Major depressive disorder, recurrent, moderate: Secondary | ICD-10-CM | POA: Diagnosis not present

## 2024-01-05 DIAGNOSIS — E782 Mixed hyperlipidemia: Secondary | ICD-10-CM | POA: Diagnosis not present

## 2024-01-05 DIAGNOSIS — Z0130 Encounter for examination of blood pressure without abnormal findings: Secondary | ICD-10-CM

## 2024-01-05 DIAGNOSIS — I340 Nonrheumatic mitral (valve) insufficiency: Secondary | ICD-10-CM

## 2024-01-05 DIAGNOSIS — R001 Bradycardia, unspecified: Secondary | ICD-10-CM

## 2024-01-05 DIAGNOSIS — R55 Syncope and collapse: Secondary | ICD-10-CM

## 2024-01-05 DIAGNOSIS — K2101 Gastro-esophageal reflux disease with esophagitis, with bleeding: Secondary | ICD-10-CM

## 2024-01-05 NOTE — Progress Notes (Signed)
 Cardiology Office Note   Date:  01/05/2024   ID:  Jerris, Keltz 01-17-1966, MRN [id number]  PCP:  Fernand Fredy RAMAN, MD  Cardiologist:  Denyse Fernand, MD      History of Present Illness: Caitlin Butler is a 58 y.o. female who presents for  Chief Complaint  Patient presents with   Follow-up    Echo results    54 YOF presents for results of echo, stress test. Has nausia, sick in stomach, and feeling of passing out along with fatigue. Never had sleep study.      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 12 capsule 3   escitalopram  (LEXAPRO ) 10 MG tablet Take 1 tablet (10 mg total) by mouth daily. 30 tablet 2   No current facility-administered medications for this visit.    Allergies:   Penicillins    Social History:   reports that she has been smoking cigarettes. She has never used smokeless tobacco. She reports that she does not drink alcohol and does not use drugs.   Family History:  family history is not on file.    ROS:     Review of Systems  Constitutional: Negative.   HENT: Negative.    Eyes: Negative.   Respiratory: Negative.    Gastrointestinal: Negative.   Genitourinary: Negative.   Musculoskeletal: Negative.   Skin: Negative.   Neurological: Negative.   Endo/Heme/Allergies: Negative.   Psychiatric/Behavioral: Negative.    All other systems reviewed and are negative.     All other systems are reviewed and negative.    PHYSICAL EXAM: VS:  BP 130/80   Pulse 88   Ht 5' 6 (1.676 m)   Wt 276 lb 12.8 oz (125.6 kg)   LMP 05/15/2013   SpO2 96%   BMI 44.68 kg/m   , BMI Body mass index is 44.68 kg/m. Last weight:  Wt Readings from Last 3 Encounters:  01/05/24 276 lb 12.8 oz (125.6 kg)  12/14/23 282 lb (127.9 kg)  12/08/23 282 lb 9.6 oz (128.2 kg)     Physical Exam Constitutional:      Appearance: Normal appearance.  Cardiovascular:     Rate and Rhythm: Normal rate and regular rhythm.     Heart sounds: Normal heart sounds.  Pulmonary:     Effort: Pulmonary effort is normal.     Breath sounds: Normal breath sounds.  Musculoskeletal:     Right lower leg: No edema.     Left lower leg: No edema.  Neurological:     Mental Status: She is alert.       EKG:   Recent Labs: 12/08/2023: ALT 21; BUN 11; Creatinine, Ser 0.91; Hemoglobin 13.6; Platelets 242; Potassium 4.3; Sodium 140; TSH 3.160    Lipid Panel    Component Value Date/Time   CHOL 232 (H) 12/08/2023 1419   TRIG 89 12/08/2023 1419   HDL 68 12/08/2023 1419   LDLCALC 149 (H) 12/08/2023 1419      Other studies Reviewed: Additional studies/ records that were reviewed today include:  Review of the above records demonstrates:       No data to display            ASSESSMENT AND PLAN:    ICD-10-CM   1. Abnormal EKG  R94.31 MYOCARDIAL PERFUSION IMAGING   sinus bradycardia 58/min non specific st changes.    2. Mixed hyperlipidemia  E78.2 MYOCARDIAL PERFUSION IMAGING   Stopped crestor  as LDL 149, advise retaking it.    3. Nausea  R11.0 MYOCARDIAL PERFUSION IMAGING    4. Sinus bradycardia  R00.1 MYOCARDIAL PERFUSION IMAGING    5. Gastroesophageal reflux disease with esophagitis and hemorrhage  K21.01 MYOCARDIAL PERFUSION IMAGING    6. Syncope, unspecified syncope type  R55 MYOCARDIAL PERFUSION IMAGING   Feels nausia associated with feeling of passing out and gets better by lying down. EKG has sinus brady 58/min, non specific st changes. Advise stress test.    7. Nonrheumatic mitral valve regurgitation  I34.0 MYOCARDIAL PERFUSION IMAGING   trace MR, trace PR, mild to  moderate TR, normal LVEF.       Problem List Items Addressed This Visit       Digestive   Gastroesophageal reflux disease with esophagitis and hemorrhage   Relevant Orders   MYOCARDIAL PERFUSION IMAGING     Other   Mixed hyperlipidemia   Relevant Orders   MYOCARDIAL PERFUSION IMAGING   Nausea   Relevant Orders   MYOCARDIAL PERFUSION IMAGING   Other Visit Diagnoses       Abnormal EKG    -  Primary   sinus bradycardia 58/min non specific st changes.   Relevant Orders   MYOCARDIAL PERFUSION IMAGING     Sinus bradycardia       Relevant Orders   MYOCARDIAL PERFUSION IMAGING     Syncope, unspecified syncope type       Feels nausia associated with feeling of passing out and gets better by lying down. EKG has sinus brady 58/min, non specific st changes. Advise stress test.   Relevant Orders   MYOCARDIAL PERFUSION IMAGING     Nonrheumatic mitral valve regurgitation       trace MR, trace PR, mild to moderate TR, normal LVEF.   Relevant Orders   MYOCARDIAL PERFUSION IMAGING          Disposition:   No follow-ups on file.    Total time spent: 50 minutes  Signed,  Denyse Bathe, MD  01/05/2024 2:50 PM    [facility]
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
## 2024-01-05 NOTE — Telephone Encounter (Signed)
 [facility] just called in regards to both of this patients referrals here. The referral coordinator was asking if since they are already seeing her for the abnormal pap, if you could just add onto that the enlarged skin lesion, because she cannot attach 2 to one visit

## 2024-01-15 ENCOUNTER — Inpatient Hospital Stay: Admit: 2024-01-15 | Payer: PRIVATE HEALTH INSURANCE | Primary: Family Medicine

## 2024-01-15 ENCOUNTER — Ambulatory Visit: Admitting: Internal Medicine

## 2024-01-15 NOTE — Progress Notes (Signed)
 Jill Kaufman  DOB: 11-27-1965  Primary: [employer] ([employer])  Secondary: [employer] [place] LOCAL [facility]  [address]  [place] [address]  Phone: [phone]  Fax: [phone] Plan Frequency: 2x a week for up to 90 days (potentially 1x a week for up to 90 days for pt preference)  Plan of Care/Certification Expiration Date: 03/27/24        Plan of Care/Certification Expiration Date:  Plan of Care/Certification Expiration Date: 03/27/24    Frequency/Duration: Plan Frequency: 2x a week for up to 90 days (potentially 1x a week for up to 90 days for pt preference)      Time In/Out:   Time In: 0922  Time Out: 1015      PT Visit Info:    Progress Note Counter: 2      Visit Count:  2    OUTPATIENT PHYSICAL THERAPY:   Treatment Note 01/15/2024       Episode  (BLE foot pain )               Treatment Diagnosis:    Pain in both feet  Medical/Referring Diagnosis:    Pain of both heels    Referring Physician:  Ethyl Elspeth HERO, MD  MD Orders:  PT Eval and Treat   Return MD Appt:     Date of Onset:  No data recorded   Allergies:   Codeine  Restrictions/Precautions:   None      Interventions Planned (Treatment may consist of any combination of the following):     See Assessment Note    Subjective Comments:   Pt reports bilateral foot/ankle pain in the heel, pt reports if she     Initial Pain Level: 2/10  Post Session Pain Level: 2/10    Medications Last Reviewed: 01/15/2024  Updated Objective Findings:  None Today  Treatment   THERAPEUTIC EXERCISE: (23  minutes):    Exercises per grid below to improve mobility, strength, balance and coordination.  Required visual, verbal, manual and tactile cues to promote proper body alignment, promote proper body posture, promote proper body mechanics and promote proper body breathing techniques.  Progressed resistance, range, repetitions and complexity of movement as indicated.   Date:  12/28/23 Date:  01/15/24   Date:      Activity/Exercise Parameters Parameters Parameters   Pt ed on biomechanics, PT POC, HEP, outcomes, prognosis, BLE strengthening education, BLE ROM education, ankle/foot differential dx of DJD, plantar fascia tightness, talocrural joint dysfunction, posterior tibial tendon dysfunction, TA weakness dysfunction, PF weakness dysfunction, toe intrinsic weakness, Achilles tendinopathy, calcaneal heel spur education, fallen arch education, windlass mechanism dysfunction, stress fx, meta tarsal bone fx education, neurogenic pain syndrome, diabetes polyneuropathy education, red flag s/s of stress fx, soft tissue ligamentous ruptures   23 mins 10 mins     SCI fit, skilled trunk reciprocation, BLE strengthening, BLE ROM and CV endurance/stamina training.     10 mins     DF  stretch BLE   3 mins ea foot                   THERAPEUTIC ACTIVITY: (  minutes):    Therapeutic activities per grid below to improve mobility, strength, balance and coordination.Required visual, verbal, manual and tactile cues to promote motor control of bilateral, lower extremity(s).   Date:   Date:   Date:     Activity/Exercise Parameters Parameters Parameters  NEUROMUSCULAR RE-EDUCATION: (25 minutes):    Exercise/activities per grid below to improve balance, coordination, kinesthetic sense, posture and proprioception.  Required visual, verbal, manual and tactile cues to promote motor control of bilateral, lower extremity(s).   Date:  01/15/24   Date:   Date:     Activity/Exercise Parameters Parameters Parameters   Towel crunches, cues for toe intrinsic activation, arch activation, medial arch control  X 40      Seated DF AROM, cues for TA activation  X 30 BLE      Seated PF AROM cues for arch control, toe intrinsic activation, gastroc/soleus activation  X 30 BLE      Seated INV/PF, posterior tibial isolation, toe intrinsic activation  X 40        MANUAL THERAPY: ( 5 minutes):   Joint mobilization, Soft tissue mobilization,  Manipulation and Manual lymphatic drainage was utilized and necessary because of the patient's restricted joint motion, painful spasm, loss of articular motion and restricted motion of soft tissue.    Date:   Date:  01/15/24   Date:     Activity/Exercise Parameters Parameters Parameters   Supine BLE Foot IASTM plantar fascia  5 mIns  5 mins                   MODALITIES: ( minutes):         Therapy in order to provide analgesia, relieve muscle spasm and reduce inflammation and edema.            Treatment/Session Summary:    Treatment Assessment:   Pt does present with BLE plantar faciosis and posterior tibial dysfunction biasing more s/s on the LLE foot ankle> than RLE foot/ankle. Pt does have early foot flat when ambulating as well as more supination noted on LLE > RLE . Pt does look to have also posterior tibial tendon dysfunction which is more noticeable on the LLE with testing PF+ INV. Will progress prn     Communication/Consultation:  None today  Equipment provided today:  None  Recommendations/Intent for next treatment session: Next visit will focus on BLE strengthening, BLE ROM and WB progressions/stamina training.    >Total Treatment Billable Duration:  53 minutes   Time In: 0922  Time Out: 1015     Cathlyn Searing, PT         Charge Capture  Events  [facility] Portal  Appt Desk  Attendance Report     Future Appointments   Date Time Provider Department Center   01/25/2024 11:45 AM Searing Cathlyn, PT [facility]   02/02/2024  9:30 AM Searing Cathlyn, PT [facility]   03/29/2024  8:30 AM PST LAB PST [facility] DE[facility]   04/03/2024  9:15 AM [facility] BI ROOM 3 [facility]   04/05/2024  8:30 AM Ethyl Elspeth HERO, MD PST [facility] DEP   05/17/2024  1:00 PM PERIPHERAL [facility] GCC   05/17/2024  2:00 PM Fermin Maryelizabeth HERO, APRN - CNP [facility] AMB   11/22/2024  1:00 PM PERIPHERAL [facility] GCC   11/22/2024  2:00 PM Mertha Garnette DEL, MD [facility]

## 2024-01-16 ENCOUNTER — Other Ambulatory Visit (HOSPITAL_COMMUNITY)
Admission: RE | Admit: 2024-01-16 | Discharge: 2024-01-16 | Disposition: A | Discharge status: Home / Self Care | Source: Ambulatory Visit | Attending: Obstetrics & Gynecology | Admitting: Obstetrics & Gynecology

## 2024-01-16 ENCOUNTER — Ambulatory Visit: Admitting: Obstetrics & Gynecology

## 2024-01-16 ENCOUNTER — Encounter: Payer: Self-pay | Admitting: Obstetrics & Gynecology

## 2024-01-16 VITALS — BP 139/87 | HR 62 | Ht 66.0 in | Wt 278.9 lb

## 2024-01-16 DIAGNOSIS — R87612 Low grade squamous intraepithelial lesion on cytologic smear of cervix (LGSIL): Secondary | ICD-10-CM

## 2024-01-16 DIAGNOSIS — N950 Postmenopausal bleeding: Secondary | ICD-10-CM | POA: Diagnosis not present

## 2024-01-16 DIAGNOSIS — N888 Other specified noninflammatory disorders of cervix uteri: Secondary | ICD-10-CM

## 2024-01-16 NOTE — Progress Notes (Signed)
    GYNECOLOGY PROGRESS NOTE  Subjective:    Patient ID: Caitlin Butler, female    DOB: 25-May-1966, 58 y.o.   MRN: [id number]  HPI  Patient is a 58 y.o. P3 here as a new patient for a colpo for a pap smear that showed LGSIL with negative HR HPV. She reports last pap was about 4 years ago. She might have had an abnormal pap in the distant past but does not remember having a colpo or any treatment. She quit smoking a year ago.   She reports PMB for a week. She has been menopausal since around age 30.  The following portions of the patient's history were reviewed and updated as appropriate: allergies, current medications, past family history, past medical history, past social history, past surgical history, and problem list.  Review of Systems Pertinent items are noted in HPI.  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  Objective:   Height 5' 6 (1.676 m), weight 278 lb 14.4 oz (126.5 kg), last menstrual period 05/15/2013. Body mass index is 45.02 kg/m. Well nourished, well hydrated Black female, no apparent distress She is ambulating and conversing normally. Consent signed, time out done Speculum placed. Cervix prepped with acetic acid. Transformation zone seen in its entirety. Colpo adequate. Colposcopic findings normal. I sprayed her cervix with Hurricaine spray and then placed a single tooth tenaculum on the anterior lip of her cervix. I obtained the ECC.  I then used a Pipelle for 2 passes to obtain an EMBX. Scant tissue was obtained.   She tolerated the procedures well.   Assessment:   1. PMB (postmenopausal bleeding)   2. LGSIL on Pap smear of cervix      Plan:   1. PMB (postmenopausal bleeding) (Primary) - await pathology - US  PELVIC COMPLETE WITH TRANSVAGINAL  2. LGSIL on Pap smear of cervix - normal colpo, negative HR HPV - If ECC is normal, then rec repeat pap in a year.

## 2024-01-17 ENCOUNTER — Encounter

## 2024-01-18 ENCOUNTER — Ambulatory Visit (INDEPENDENT_AMBULATORY_CARE_PROVIDER_SITE_OTHER)

## 2024-01-18 DIAGNOSIS — K2101 Gastro-esophageal reflux disease with esophagitis, with bleeding: Secondary | ICD-10-CM

## 2024-01-18 DIAGNOSIS — R001 Bradycardia, unspecified: Secondary | ICD-10-CM

## 2024-01-18 DIAGNOSIS — E782 Mixed hyperlipidemia: Secondary | ICD-10-CM

## 2024-01-18 DIAGNOSIS — R9431 Abnormal electrocardiogram [ECG] [EKG]: Secondary | ICD-10-CM

## 2024-01-18 DIAGNOSIS — R55 Syncope and collapse: Secondary | ICD-10-CM

## 2024-01-18 DIAGNOSIS — R110 Nausea: Secondary | ICD-10-CM

## 2024-01-18 DIAGNOSIS — I340 Nonrheumatic mitral (valve) insufficiency: Secondary | ICD-10-CM

## 2024-01-18 LAB — SURGICAL PATHOLOGY

## 2024-01-19 ENCOUNTER — Ambulatory Visit
Admission: RE | Admit: 2024-01-19 | Discharge: 2024-01-19 | Disposition: A | Discharge status: Home / Self Care | Source: Ambulatory Visit | Attending: Obstetrics & Gynecology | Admitting: Obstetrics & Gynecology

## 2024-01-19 DIAGNOSIS — N950 Postmenopausal bleeding: Secondary | ICD-10-CM | POA: Diagnosis present

## 2024-01-20 ENCOUNTER — Encounter: Payer: Self-pay | Admitting: Obstetrics & Gynecology

## 2024-01-20 DIAGNOSIS — B9689 Other specified bacterial agents as the cause of diseases classified elsewhere: Secondary | ICD-10-CM

## 2024-01-22 ENCOUNTER — Telehealth: Payer: Self-pay

## 2024-01-22 NOTE — Telephone Encounter (Signed)
 Doyal called and wanted to give report and let Dr. Starla know report is in

## 2024-01-23 ENCOUNTER — Ambulatory Visit (INDEPENDENT_AMBULATORY_CARE_PROVIDER_SITE_OTHER): Admitting: Licensed Clinical Social Worker

## 2024-01-23 DIAGNOSIS — F331 Major depressive disorder, recurrent, moderate: Secondary | ICD-10-CM | POA: Diagnosis not present

## 2024-01-23 DIAGNOSIS — F411 Generalized anxiety disorder: Secondary | ICD-10-CM
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
## 2024-01-25 ENCOUNTER — Encounter: Payer: PRIVATE HEALTH INSURANCE | Primary: Family Medicine

## 2024-01-29 ENCOUNTER — Encounter: Payer: Self-pay | Admitting: Internal Medicine

## 2024-01-29 ENCOUNTER — Ambulatory Visit: Admitting: Internal Medicine

## 2024-01-29 ENCOUNTER — Ambulatory Visit (INDEPENDENT_AMBULATORY_CARE_PROVIDER_SITE_OTHER): Admitting: Internal Medicine

## 2024-01-29 VITALS — BP 124/84 | HR 81 | Ht 66.0 in | Wt 283.2 lb

## 2024-01-29 DIAGNOSIS — R400 Somnolence: Secondary | ICD-10-CM

## 2024-01-29 DIAGNOSIS — E559 Vitamin D deficiency, unspecified: Secondary | ICD-10-CM | POA: Diagnosis not present

## 2024-01-29 DIAGNOSIS — F411 Generalized anxiety disorder: Secondary | ICD-10-CM | POA: Diagnosis not present

## 2024-01-29 DIAGNOSIS — E782 Mixed hyperlipidemia: Secondary | ICD-10-CM | POA: Diagnosis not present

## 2024-01-29 DIAGNOSIS — Z0130 Encounter for examination of blood pressure without abnormal findings: Secondary | ICD-10-CM

## 2024-01-29 NOTE — Progress Notes (Signed)
 Established Patient Office Visit  Subjective:  Patient ID: Caitlin Butler, female    DOB: 09-Dec-1965  Age: 58 y.o. MRN: [id number]  Chief Complaint  Patient presents with   Follow-up    1 month follow up    Patient was seen today for a 1 month FU visit. She reports overall wellness and that her general anxiety disorder symptoms continue to improve while speaking with a counselor and while on Lexapro  10 mg once daily. Patient denies headache, chest pain, palpitations, shortness of breath, nausea, vomiting, diarrhea at this time. She reports she has not started taking Crestor  yet, she wanted to make lifestyle modifications to see if her cholesterol improved. She has started taking fiber supplements and eating more fruits and vegetables. Discussed the risks associated with elevated cholesterol and patient agreed to revisit statin medication if lipid panel still abnormal in 6-7 weeks. She reports frequent daytime sleepiness and not feeling well rested. She is unsure if she snores at night. She endorses drinking at least 1 alcoholic beverage in the evenings. Mammogram order was previously resent to imaging company, but patient has not heard from them to schedule an appointment.    No other concerns at this time.   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    Mixed hyperlipidemia     Past Surgical History:  Procedure Laterality Date   HERNIA REPAIR      Social History   Socioeconomic History   Marital status: Divorced    Spouse name: Not on file   Number of children: Not on file   Years of education: Not on file   Highest education level: Not on file  Occupational History   Not on file  Tobacco Use   Smoking status: Former    Current packs/day: 0.50    Types: Cigarettes   Smokeless tobacco: Never  Substance and Sexual Activity   Alcohol use: Yes    Alcohol/week: 6.0 standard  drinks of alcohol    Types: 6 Glasses of wine per week   Drug use: No   Sexual activity: Not Currently    Birth control/protection: None  Other Topics Concern   Not on file  Social History Narrative   Not on file   Social Drivers of Health   Financial Resource Strain: Not on file  Food Insecurity: Not on file  Transportation Needs: Not on file  Physical Activity: Not on file  Stress: Not on file  Social Connections: Not on file  Intimate Partner Violence: Not on file    History reviewed. No pertinent family history.  Allergies  Allergen Reactions   Penicillins     Childhood rxn     Outpatient Medications Prior to Visit  Medication Sig   Cholecalciferol (VITAMIN D3) 1.25 MG (50000 UT) CAPS Take 1 capsule (1.25 mg total) by mouth once a week.   escitalopram  (LEXAPRO ) 10 MG tablet Take 1 tablet (10 mg total) by mouth daily.   No facility-administered medications prior to visit.    Review of Systems  Constitutional:  Positive for malaise/fatigue.  HENT: Negative.    Eyes: Negative.   Respiratory: Negative.    Cardiovascular: Negative.   Gastrointestinal: Negative.   Genitourinary: Negative.   Musculoskeletal: Negative.   Skin: Negative.   Neurological: Negative.   Endo/Heme/Allergies: Negative.   Psychiatric/Behavioral:  The patient is nervous/anxious.  Objective:   BP 124/84   Pulse 81   Ht 5' 6 (1.676 m)   Wt 283 lb 3.2 oz (128.5 kg)   LMP 05/15/2013   SpO2 97%   BMI 45.71 kg/m   Vitals:   01/29/24 1526  BP: 124/84  Pulse: 81  Height: 5' 6 (1.676 m)  Weight: 283 lb 3.2 oz (128.5 kg)  SpO2: 97%  BMI (Calculated): 45.73    Physical Exam Vitals reviewed.  Constitutional:      Appearance: Normal appearance.  HENT:     Head: Normocephalic.     Nose: Nose normal.     Mouth/Throat:     Mouth: Mucous membranes are moist.     Pharynx: Oropharynx is clear.  Eyes:     Extraocular Movements: Extraocular movements intact.      Conjunctiva/sclera: Conjunctivae normal.     Pupils: Pupils are equal, round, and reactive to light.  Cardiovascular:     Rate and Rhythm: Normal rate and regular rhythm.     Pulses: Normal pulses.  Pulmonary:     Effort: Pulmonary effort is normal.     Breath sounds: Normal breath sounds.  Abdominal:     General: Abdomen is flat. Bowel sounds are normal.     Palpations: Abdomen is soft.  Genitourinary:    Comments: deferred Musculoskeletal:        General: Normal range of motion.     Cervical back: Normal range of motion and neck supple.  Skin:    General: Skin is warm and dry.     Capillary Refill: Capillary refill takes less than 2 seconds.  Neurological:     General: No focal deficit present.     Mental Status: She is alert.  Psychiatric:        Mood and Affect: Mood normal.        Behavior: Behavior normal.    No results found for any visits on 01/29/24.  Recent Results (from the past 2160 hours)  CMP14+EGFR     Status: None   Collection Time: 12/08/23  2:19 PM  Result Value Ref Range   Glucose 87 70 - 99 mg/dL   BUN 11 6 - 24 mg/dL   Creatinine, Ser 9.08 0.57 - 1.00 mg/dL   eGFR 73 >40 fO/fpw/8.26   BUN/Creatinine Ratio 12 9 - 23   Sodium 140 134 - 144 mmol/L   Potassium 4.3 3.5 - 5.2 mmol/L   Chloride 103 96 - 106 mmol/L   CO2 22 20 - 29 mmol/L   Calcium  9.6 8.7 - 10.2 mg/dL   Total Protein 7.5 6.0 - 8.5 g/dL   Albumin 4.5 3.8 - 4.9 g/dL   Globulin, Total 3.0 1.5 - 4.5 g/dL   Bilirubin Total 0.3 0.0 - 1.2 mg/dL   Alkaline Phosphatase 65 44 - 121 IU/L   AST 27 0 - 40 IU/L   ALT 21 0 - 32 IU/L  CBC with Diff     Status: None   Collection Time: 12/08/23  2:19 PM  Result Value Ref Range   WBC 5.8 3.4 - 10.8 x10E3/uL   RBC 4.54 3.77 - 5.28 x10E6/uL   Hemoglobin 13.6 11.1 - 15.9 g/dL   Hematocrit 58.9 65.9 - 46.6 %   MCV 90 79 - 97 fL   MCH 30.0 26.6 - 33.0 pg   MCHC 33.2 31.5 - 35.7 g/dL   RDW 86.9 88.2 - 84.5 %   Platelets 242 150 - 450 x10E3/uL    Neutrophils  58 Not Estab. %   Lymphs 29 Not Estab. %   Monocytes 9 Not Estab. %   Eos 3 Not Estab. %   Basos 1 Not Estab. %   Neutrophils Absolute 3.4 1.4 - 7.0 x10E3/uL   Lymphocytes Absolute 1.7 0.7 - 3.1 x10E3/uL   Monocytes Absolute 0.5 0.1 - 0.9 x10E3/uL   EOS (ABSOLUTE) 0.2 0.0 - 0.4 x10E3/uL   Basophils Absolute 0.0 0.0 - 0.2 x10E3/uL   Immature Granulocytes 0 Not Estab. %   Immature Grans (Abs) 0.0 0.0 - 0.1 x10E3/uL  Lipid Panel w/o Chol/HDL Ratio     Status: Abnormal   Collection Time: 12/08/23  2:19 PM  Result Value Ref Range   Cholesterol, Total 232 (H) 100 - 199 mg/dL   Triglycerides 89 0 - 149 mg/dL   HDL 68 >60 mg/dL   VLDL Cholesterol Cal 15 5 - 40 mg/dL   LDL Chol Calc (NIH) 850 (H) 0 - 99 mg/dL  Vitamin D  (25 hydroxy)     Status: Abnormal   Collection Time: 12/08/23  2:19 PM  Result Value Ref Range   Vit D, 25-Hydroxy 16.7 (L) 30.0 - 100.0 ng/mL    Comment: Vitamin D  deficiency has been defined by the Institute of Medicine and an Endocrine Society practice guideline as a level of serum 25-OH vitamin D  less than 20 ng/mL (1,2). The Endocrine Society went on to further define vitamin D  insufficiency as a level between 21 and 29 ng/mL (2). 1. IOM (Institute of Medicine). 2010. Dietary reference    intakes for calcium  and D. [place]: The    [employer]. 2. Holick MF, Binkley , Bischoff-Ferrari HA, et al.    Evaluation, treatment, and prevention of vitamin D     deficiency: an Endocrine Society clinical practice    guideline. JCEM. 2011 Jul; 96(7):1911-30.   [id number]     Status: None   Collection Time: 12/08/23  2:19 PM  Result Value Ref Range   TSH 3.160 0.450 - 4.500 uIU/mL   T3, Free 2.7 2.0 - 4.4 pg/mL   Free T4 1.12 0.82 - 1.77 ng/dL  POCT Urinalysis Dipstick (18997)     Status: None   Collection Time: 12/14/23  3:00 PM  Result Value Ref Range   Color, UA Clear    Clarity, UA Clear    Glucose, UA Negative Negative    Bilirubin, UA Negative    Ketones, UA Negative    Spec Grav, UA 1.010 1.010 - 1.025   Blood, UA Negative    pH, UA 5.5 5.0 - 8.0   Protein, UA Negative Negative   Urobilinogen, UA 0.2 0.2 or 1.0 E.U./dL   Nitrite, UA Negative    Leukocytes, UA Negative Negative   Appearance Clear    Odor No   IGP, Aptima HPV     Status: Abnormal   Collection Time: 12/14/23  3:46 PM  Result Value Ref Range   DIAGNOSIS: Comment (A)     Comment: EPITHELIAL CELL ABNORMALITY. LOW-GRADE SQUAMOUS INTRAEPITHELIAL LESION (LSIL); (ENCOMPASSING HUMAN PAPILLOMAVIRUS /MILD DYSPLASIA/CIN1).    Specimen adequacy: Comment     Comment: Satisfactory for evaluation. No endocervical component is identified.   Clinician Provided ICD10 Comment     Comment: Z12.72 Z12.4 Z11.51    Performed by: Comment     Comment: Comer Pica, Cytologist (ASCP)   Electronically signed by: Comment     Comment: [place] DELENA Blanch, MD, Pathologist   PAP Smear Comment .    PATHOLOGIST PROVIDED  ICD10: Comment     Comment: R87.612   Note: Comment     Comment: The Pap smear is a screening test designed to aid in the detection of premalignant and malignant conditions of the uterine cervix.  It is not a diagnostic procedure and should not be used as the sole means of detecting cervical cancer.  Both false-positive and false-negative reports do occur.    Test Methodology Comment     Comment: This liquid based ThinPrep(R) pap test was interpreted using the Hologic(R) Genius(TM) Cervical Algorithm whole slide imaging system.    HPV Aptima Negative Negative    Comment: This nucleic acid amplification test detects fourteen high-risk HPV types (16,18,31,33,35,39,45,51,52,56,58,59,66,68) without differentiation.   Cologuard     Status: None   Collection Time: 12/18/23  9:35 AM  Result Value Ref Range   COLOGUARD Negative Negative    Comment: The Cologuard (TM) test was performed on this specimen.  NEGATIVE TEST RESULT. A negative  Cologuard result indicates a low likelihood that a colorectal cancer (CRC) or advanced adenoma (adenomatous polyps with more advanced pre-malignant features) is present. The chance that a person with a negative Cologuard test has a colorectal cancer is less than 1 in 1500 (negative predictive value >99.9%) or has an advanced adenoma is less than 5.3% (negative predictive value 94.7%). These data are based on a prospective cross-sectional study of 10,000 individuals at average risk for colorectal cancer who were screened with both Cologuard and colonoscopy. (Imperiale T. et al, N Engl J Med 2014;370(14):1286-1297) The normal value (reference range) for this assay is negative.  COLOGUARD RE-SCREENING RECOMMENDATION: Periodic colorectal cancer screening is an important part of preventive healthcare for asymptomatic individuals at average risk for colorectal cancer. Following a negative Cologuard  result, the American Cancer Society and U.S. Multi-Society Task Force screening guidelines recommend a Cologuard re-screening interval of 3 years.  References: American Cancer Society Guideline for Colorectal Cancer Screening: https://www.cancer.org/cancer/colon-rectal-cancer/detection-diagnosis-staging/acs-recommendations.html.; Rex DK, Boland CR, Dominitz JK, Colorectal Cancer Screening: Recommendations for Physicians and Patients from the U.S. Multi-Society Task Force on Colorectal Cancer Screening , Am J Gastroenterology 2017; 112:1016-1030.  TEST DESCRIPTION: Composite algorithmic analysis of stool DNA-biomarkers with hemoglobin immunoassay.   Quantitative values of individual biomarkers are not reportable and are not associated with individual biomarker result reference ranges. Cologuard is intended for colorectal cancer screening of adults of either sex, 45 years or older, who are at average-risk for colorectal cancer (CRC). Cologuard has been approved for use by the U.S.  FDA. The performance of Cologuard was  established in a cross sectional study of average-risk adults aged 37-84. Cologuard performance in patients ages 45 to 35 years was estimated by sub-group analysis of near-age groups. Colonoscopies performed for a positive result may find as the most clinically significant lesion: colorectal cancer [4.0%], advanced adenoma (including sessile serrated polyps greater than or equal to 1cm diameter) [20%] or non- advanced adenoma [31%]; or no colorectal neoplasia [45%]. These estimates are derived from a prospective cross-sectional screening study of 10,000 individuals at average risk for colorectal cancer who were screened with both Cologuard and colonoscopy. (Imperiale T. et al, LOISE [place] J Med 2014;370(14):1286-1297.) Cologuard may produce a false negative or false positive result (no colorectal cancer or precancerous polyp present at colonoscopy follow up). A negative Cologuard test result does not guarantee the absence of CRC or advanced adenoma  (pre-cancer). The current Cologuard screening interval is every 3 years. Science writer and [place]. Therapist, music). Cologuard performance data in a  10,000 patient pivotal study using colonoscopy as the reference method can be accessed at the following location: www.exactlabs.com/results. Additional description of the Cologuard test process, warnings and precautions can be found at www.cologuard.com.   Surgical pathology     Status: None   Collection Time: 01/16/24 10:58 AM  Result Value Ref Range   SURGICAL PATHOLOGY      SURGICAL PATHOLOGY CASE: [id number] PATIENT: APOLINAR MOLT Surgical Pathology Report     Clinical History: LGSIL, PMB (cm)     FINAL MICROSCOPIC DIAGNOSIS:  A.   ENDOCERVIX, CURETTAGE: Benign endocervical mucosa and minute detached metaplastic squamous fragment.  B.   ENDOMETRIUM, BIOPSY: Inactive endometrium. Negative for endometrial intraepithelial neoplasia (EIN) and malignancy.  GROSS DESCRIPTION: A.   Received in formalin is blood tinged mucus that is entirely submitted in one block.  Volume: 0.7 x 0.6 x 0.2 cm (1 B)  B.  Received in formalin is blood tinged mucus that is entirely submitted in one block.  Volume: 0.7 x 0.5 x 0.2 cm (1 B) (KW, 01/17/2024)  Final Diagnosis performed by Norleen Dover, MD.   Electronically signed 01/18/2024 Technical and / or Professional components performed at [facility]. [facility], [address] [place], [place] [address].  Immunohistochemistry Technical component (if applicable) was performed at [facility]. [address], [address], [place], [place] [address].   IMMUNOHISTOCHEMISTRY DISCLAIMER (if applicable): Some of these immunohistochemical stains may have been developed and the performance characteristics determine by [facility]. Some may not have been cleared or approved by the U.S. Food and Drug Administration. The FDA has determined that such clearance or approval is not necessary. This test is used for clinical purposes. It should not be regarded as investigational or for research. This laboratory is certified under the Clinical Laboratory Improvement Amendments of 1988 (CLIA-88) as qualified to perform high complexity clinical laboratory testing.  The controls stained appropriately.   IHC stains are performed on formalin fixed, paraffin embedded tissue using a 3,3diaminobenzidine (DAB) chromogen and Leica Bond Autostainer System. The staining intensity of the nucleus is score manually and is reported as the percentage of tumor cell  nuclei demonstrating specific nuclear staining. The specimens are fixed in 10% Neutral Formalin for at least 6 hours and up to 72hrs. These tests are validated on decalcified tissue. Results should be interpreted with caution given the possibility of false negative results on decalcified specimens. Antibody Clones are as follows ER-clone 34F, PR-clone 16, Ki67- clone MM1.  Some of these immunohistochemical stains may have been developed and the performance characteristics determined by [facility].       Assessment & Plan:  Continue taking medications as prescribed. Continue using counseling services. Will refer for at home sleep study to rule out OSA. Continue monitoring diet and limit cholesterol intake, exercise as tolerated. FU in 7 weeks. Will check labs at that time to review lipid panel and discuss need for statin therapy at that time. Call mammogram office to schedule screening mammogram. Problem List Items Addressed This Visit     GAD (generalized anxiety disorder)   Mixed hyperlipidemia - Primary   Relevant Orders   Lipid Panel w/o Chol/HDL Ratio   Vitamin D  deficiency   Relevant Orders   Vitamin D  (25 hydroxy)   Other Visit Diagnoses       Daytime somnolence       Relevant Orders   Ambulatory referral to Sleep Studies       Return in about 7  weeks (around 03/18/2024).   Total time spent: 30 minutes  FERNAND FREDY RAMAN, MD  01/29/2024   This document may have been prepared by [facility] Voice Recognition software and as such may include unintentional dictation errors.

## 2024-02-01 ENCOUNTER — Telehealth: Payer: Self-pay | Admitting: Internal Medicine

## 2024-02-01 ENCOUNTER — Other Ambulatory Visit: Payer: Self-pay | Admitting: Internal Medicine

## 2024-02-01 DIAGNOSIS — Z1231 Encounter for screening mammogram for malignant neoplasm of breast: Secondary | ICD-10-CM

## 2024-02-01 NOTE — Telephone Encounter (Signed)
 PT called in regards to her mammogram order, can you please place that & let pt know when it is done so she can call them to get scheduled? Thank you

## 2024-02-02 ENCOUNTER — Inpatient Hospital Stay: Admit: 2024-02-02 | Payer: PRIVATE HEALTH INSURANCE | Primary: Family Medicine

## 2024-02-02 NOTE — Progress Notes (Signed)
 Apolinar Sammie Molt  DOB: February 23, 1966  Primary: [employer] ([employer])  Secondary: [employer] [place] LOCAL [facility]  [address]  [place] [address]  Phone: [phone]  Fax: [phone] Plan Frequency: 2x a week for up to 90 days (potentially 1x a week for up to 90 days for pt preference)  Plan of Care/Certification Expiration Date: 03/27/24        Plan of Care/Certification Expiration Date:  Plan of Care/Certification Expiration Date: 03/27/24    Frequency/Duration: Plan Frequency: 2x a week for up to 90 days (potentially 1x a week for up to 90 days for pt preference)      Time In/Out:   Time In: 0930  Time Out: 1012      PT Visit Info:    Progress Note Counter: 3      Visit Count:  3    OUTPATIENT PHYSICAL THERAPY:   Treatment Note 02/02/2024       Episode  (BLE foot pain )               Treatment Diagnosis:    Pain in both feet  Medical/Referring Diagnosis:    Pain of both heels    Referring Physician:  Ethyl Elspeth HERO, MD  MD Orders:  PT Eval and Treat   Return MD Appt:     Date of Onset:  No data recorded   Allergies:   Codeine  Restrictions/Precautions:   None      Interventions Planned (Treatment may consist of any combination of the following):     See Assessment Note    Subjective Comments:   Pt reports bilateral foot/ankle pain in the heel, pt reports that she gets medial heel pain when she weight bears, intermittent improvement noted    Initial Pain Level: 2/10  Post Session Pain Level: 2/10    Medications Last Reviewed: 02/02/2024  Updated Objective Findings:  None Today  Treatment   THERAPEUTIC EXERCISE: (15  minutes):    Exercises per grid below to improve mobility, strength, balance and coordination.  Required visual, verbal, manual and tactile cues to promote proper body alignment, promote proper body posture, promote proper body mechanics and promote proper body breathing techniques.  Progressed resistance, range, repetitions and  complexity of movement as indicated.   Date:  12/28/23 Date:  01/15/24   Date:  02/02/24     Activity/Exercise Parameters Parameters Parameters   Pt ed on biomechanics, PT POC, HEP, outcomes, prognosis, BLE strengthening education, BLE ROM education, ankle/foot differential dx of DJD, plantar fascia tightness, talocrural joint dysfunction, posterior tibial tendon dysfunction, TA weakness dysfunction, PF weakness dysfunction, toe intrinsic weakness, Achilles tendinopathy, calcaneal heel spur education, fallen arch education, windlass mechanism dysfunction, stress fx, meta tarsal bone fx education, neurogenic pain syndrome, diabetes polyneuropathy education, red flag s/s of stress fx, soft tissue ligamentous ruptures   23 mins 10 mins     SCI fit, skilled trunk reciprocation, BLE strengthening, BLE ROM and CV endurance/stamina training.     10 mins  10 mins    DF  stretch BLE   3 mins ea foot  3 mins ea foot                  THERAPEUTIC ACTIVITY: (  minutes):    Therapeutic activities per grid below to improve mobility, strength, balance and coordination.Required visual, verbal, manual and tactile cues to promote motor control of bilateral, lower extremity(s).   Date:  Date:   Date:     Activity/Exercise Parameters Parameters Parameters                       NEUROMUSCULAR RE-EDUCATION: (25 minutes):    Exercise/activities per grid below to improve balance, coordination, kinesthetic sense, posture and proprioception.  Required visual, verbal, manual and tactile cues to promote motor control of bilateral, lower extremity(s).   Date:  01/15/24   Date:  02/02/24   Date:     Activity/Exercise Parameters Parameters Parameters   Towel crunches, cues for toe intrinsic activation, arch activation, medial arch control  X 40  X 40    Seated DF AROM, cues for TA activation  X 30 BLE  X 30 BLE     Seated PF AROM cues for arch control, toe intrinsic activation, gastroc/soleus activation  X 30 BLE  X 30 BLE     Seated INV/PF, posterior  tibial isolation, toe intrinsic activation  X 40  X 40       MANUAL THERAPY: ( 5 minutes):   Joint mobilization, Soft tissue mobilization, Manipulation and Manual lymphatic drainage was utilized and necessary because of the patient's restricted joint motion, painful spasm, loss of articular motion and restricted motion of soft tissue.    Date:   Date:  01/15/24   Date:  02/02/24     Activity/Exercise Parameters Parameters Parameters   Supine BLE Foot IASTM plantar fascia  5 mIns  5 mins  5 mins                 MODALITIES: ( minutes):         Therapy in order to provide analgesia, relieve muscle spasm and reduce inflammation and edema.            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 Pt did well with IASTM and strengthening exercises. Pt still reported some discomfort when ambulating post tx session. Pt will undergo WB analysis next visit to see where the dysfunction is occurring.  Will progress prn     Communication/Consultation:  None today  Equipment provided today:  None  Recommendations/Intent for next treatment session: Next visit will focus on BLE strengthening, BLE ROM and WB progressions/stamina training.    >Total Treatment Billable Duration:  40 minutes   Time In: 0930  Time Out: 1012     Cathlyn Searing, PT         Charge Capture  Events  MedBridge Portal  Appt Desk  Attendance Report     Future Appointments   Date Time Provider Department Center   03/29/2024  8:30 AM PST LAB PST [facility] ECC DEP   04/03/2024  9:15 AM SFE MAM BI ROOM 3 SFERMAM SFE   04/05/2024  8:30 AM Ethyl Elspeth HERO, MD PST [facility] ECC DEP   05/17/2024  1:00 PM PERIPHERAL GCCO[facility] GCC   05/17/2024  2:00 PM Fermin Maryelizabeth HERO, APRN - CNP [facility] AMB   11/22/2024  1:00 PM PERIPHERAL GCCOIG GCC   11/22/2024  2:00 PM Mertha Garnette DEL, MD [facility] AMB

## 2024-02-05 ENCOUNTER — Other Ambulatory Visit (HOSPITAL_COMMUNITY)
Admission: RE | Admit: 2024-02-05 | Discharge: 2024-02-05 | Disposition: A | Discharge status: Home / Self Care | Source: Ambulatory Visit | Attending: Obstetrics & Gynecology | Admitting: Obstetrics & Gynecology

## 2024-02-05 ENCOUNTER — Ambulatory Visit: Admitting: Obstetrics & Gynecology

## 2024-02-05 VITALS — BP 146/96 | HR 75 | Ht 66.0 in | Wt 280.6 lb

## 2024-02-05 DIAGNOSIS — N9089 Other specified noninflammatory disorders of vulva and perineum: Secondary | ICD-10-CM

## 2024-02-05 DIAGNOSIS — N898 Other specified noninflammatory disorders of vagina: Secondary | ICD-10-CM | POA: Insufficient documentation

## 2024-02-05 DIAGNOSIS — L918 Other hypertrophic disorders of the skin: Secondary | ICD-10-CM

## 2024-02-05 DIAGNOSIS — N950 Postmenopausal bleeding: Secondary | ICD-10-CM

## 2024-02-05 NOTE — Progress Notes (Signed)
    GYNECOLOGY PROGRESS NOTE  Subjective:    Patient ID: Caitlin Butler, female    DOB: Nov 30, 1965, 58 y.o.   MRN: [id number]  HPI  Patient is a 58 y.o. P3 is here for a growing lesion on her mons.  She also complains of a long term vaginal odor. She is still having some PMB.   Of note, I did a colpo for LGSIL this month and the ECC was negative, so she will need a pap again in a year. I did an embx also with negative pathology.  Her ultrasound showed the following:  IMPRESSION: 1. The uterus is splayed by hypoechoic material, possibly blood products. The echogenic, endometrial lining is not thickened measuring 6 mm. Nonemergent pelvic MRI with IV contrast recommended for further characterization. 2. Small posterior uterine body fibroid measuring 2.6 x 2.6 x 2.9 cm.  The following portions of the patient's history were reviewed and updated as appropriate: allergies, current medications, past family history, past medical history, past social history, past surgical history, and problem list.  Review of Systems Pertinent items are noted in HPI.   Objective:   Blood pressure (!) 146/96, pulse 75, height 5' 6 (1.676 m), weight 280 lb 9.6 oz (127.3 kg), last menstrual period 05/15/2013. Body mass index is 45.29 kg/m. Well nourished, well hydrated Black female, no apparent distress She is ambulating and conversing normally. EG- less than 1 mm probable skin tag on right half of mons pubis.  I don't notice an odor. Vagina atrophy is noted.  I prepped the periclitoral area with Hurricaine spray and then betadine after consenting her for a vulvar biopsy. I then injected 1 mL of 1% lidocaine  with a 25 gauge needle. I elevated the lesion and excised it entirely.  Silver nitrate was used to achieve hemostasis. She tolerated the procedure well.     Assessment:   PMB- we discussed watchful waiting versus d&c. She opts for d&c. Vaginal odor- Aptima sent Vulvar lesion- await  pathology  Plan:   As above

## 2024-02-06 ENCOUNTER — Encounter: Payer: Self-pay | Admitting: Cardiovascular Disease

## 2024-02-06 ENCOUNTER — Ambulatory Visit (INDEPENDENT_AMBULATORY_CARE_PROVIDER_SITE_OTHER): Admitting: Cardiovascular Disease

## 2024-02-06 VITALS — BP 126/84 | HR 67 | Ht 66.0 in | Wt 279.8 lb

## 2024-02-06 DIAGNOSIS — R001 Bradycardia, unspecified: Secondary | ICD-10-CM | POA: Diagnosis not present

## 2024-02-06 DIAGNOSIS — M545 Low back pain, unspecified: Secondary | ICD-10-CM

## 2024-02-06 DIAGNOSIS — E782 Mixed hyperlipidemia: Secondary | ICD-10-CM | POA: Diagnosis not present

## 2024-02-06 DIAGNOSIS — I340 Nonrheumatic mitral (valve) insufficiency: Secondary | ICD-10-CM

## 2024-02-06 DIAGNOSIS — R0789 Other chest pain: Secondary | ICD-10-CM

## 2024-02-06 DIAGNOSIS — Z0130 Encounter for examination of blood pressure without abnormal findings: Secondary | ICD-10-CM

## 2024-02-06 NOTE — Progress Notes (Signed)
 Cardiology Office Note   Date:  02/06/2024   ID:  Caitlin, Butler Sep 19, 1965, MRN [id number]  PCP:  Caitlin Fredy RAMAN, MD  Cardiologist:  Caitlin Fernand, MD      History of Present Illness: Caitlin Butler is a 58 y.o. female who presents for  Chief Complaint  Patient presents with   Follow-up    4 week follow up nst results    Back has been hurting, no SOB or Chest pains.      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 12 capsule 3   escitalopram  (LEXAPRO ) 10 MG tablet Take 1 tablet (10 mg total) by mouth daily. 30 tablet 2   No current facility-administered medications for this visit.    Allergies:   Penicillins    Social History:   reports that she has quit smoking. Her smoking use included cigarettes. She has never used smokeless tobacco. She reports current alcohol use of about 6.0 standard drinks of alcohol per week. She reports that she does not use drugs.   Family History:  family history is not on file.    ROS:     Review of Systems  Constitutional: Negative.   HENT: Negative.    Eyes: Negative.   Respiratory: Negative.    Gastrointestinal: Negative.   Genitourinary: Negative.   Musculoskeletal: Negative.   Skin: Negative.   Neurological: Negative.   Endo/Heme/Allergies: Negative.   Psychiatric/Behavioral: Negative.    All other systems reviewed and are negative.     All other systems are reviewed and negative.    PHYSICAL EXAM: VS:  BP 126/84   Pulse 67   Ht 5' 6 (1.676 m)   Wt 279 lb 12.8 oz (126.9 kg)   LMP 05/15/2013   SpO2 97%   BMI 45.16 kg/m  , BMI  Body mass index is 45.16 kg/m. Last weight:  Wt Readings from Last 3 Encounters:  02/06/24 279 lb 12.8 oz (126.9 kg)  02/05/24 280 lb 9.6 oz (127.3 kg)  01/29/24 283 lb 3.2 oz (128.5 kg)     Physical Exam Constitutional:      Appearance: Normal appearance.  Cardiovascular:     Rate and Rhythm: Normal rate and regular rhythm.     Heart sounds: Normal heart sounds.  Pulmonary:     Effort: Pulmonary effort is normal.     Breath sounds: Normal breath sounds.  Musculoskeletal:     Right lower leg: No edema.     Left lower leg: No edema.  Neurological:     Mental Status: She is alert.       EKG:   Recent Labs: 12/08/2023: ALT 21; BUN 11; Creatinine, Ser 0.91; Hemoglobin 13.6; Platelets 242; Potassium 4.3; Sodium 140; TSH 3.160    Lipid Panel    Component Value Date/Time   CHOL 232 (H) 12/08/2023 1419   TRIG 89 12/08/2023 1419   HDL 68 12/08/2023 1419   LDLCALC 149 (H) 12/08/2023 1419      Other studies Reviewed: Additional studies/ records that were reviewed  today include:  Review of the above records demonstrates:       No data to display            ASSESSMENT AND PLAN:    ICD-10-CM   1. Chest pain, non-cardiac  R07.89    Stress test showed no ischaemia, normal LVEF. Reassured the patient. Getting sleep study.    2. Nonrheumatic mitral valve regurgitation  I34.0    Stress test normal. ECHo had normal LVEF, grade 1 diastolic dysfunction. But not SOB. Consider farxia if get SOB.    3. Sinus bradycardia  R00.1     4. Mixed hyperlipidemia  E78.2        Problem List Items Addressed This Visit       Other   Mixed hyperlipidemia   Other Visit Diagnoses       Chest pain, non-cardiac    -  Primary   Stress test showed no ischaemia, normal LVEF. Reassured the patient. Getting sleep study.     Nonrheumatic mitral valve regurgitation       Stress test normal. ECHo had normal LVEF, grade 1 diastolic dysfunction. But not SOB. Consider farxia if get SOB.      Sinus bradycardia              Disposition:   Return in about 4 months (around 06/07/2024).    Total time spent: 30 minutes  Signed,  Caitlin Bathe, MD  02/06/2024 11:50 AM    [facility]

## 2024-02-07 LAB — CERVICOVAGINAL ANCILLARY ONLY
Bacterial Vaginitis (gardnerella): POSITIVE — AB
Candida Glabrata: NEGATIVE
Candida Vaginitis: NEGATIVE
Chlamydia: NEGATIVE
Comment: NEGATIVE
Comment: NEGATIVE
Comment: NEGATIVE
Comment: NEGATIVE
Comment: NEGATIVE
Comment: NORMAL
Neisseria Gonorrhea: NEGATIVE
Trichomonas: NEGATIVE

## 2024-02-07 LAB — SURGICAL PATHOLOGY

## 2024-02-09 ENCOUNTER — Ambulatory Visit: Payer: Self-pay

## 2024-02-09 MED ORDER — LEVOTHYROXINE SODIUM 75 MCG PO TABS
75 | ORAL_TABLET | Freq: Every day | ORAL | 1 refills | 90.00000 days | Status: DC
Start: 2024-02-09 — End: 2024-04-05

## 2024-02-09 MED ORDER — METRONIDAZOLE 0.75 % VA GEL: 1.0000 | Freq: Every day | VAGINAL | 1 refills | Status: AC

## 2024-02-09 NOTE — Telephone Encounter (Signed)
 Treatment regimens discussed with patient. She prefers Metrogel . Rx sent to pharmacy on file. Patient aware to refrain from intercourse and alcohol during treatment.

## 2024-02-13 ENCOUNTER — Encounter: Payer: Self-pay | Admitting: Obstetrics & Gynecology

## 2024-02-13 ENCOUNTER — Other Ambulatory Visit: Payer: Self-pay | Admitting: Obstetrics & Gynecology

## 2024-02-13 DIAGNOSIS — N760 Acute vaginitis: Secondary | ICD-10-CM

## 2024-02-13 MED ORDER — METRONIDAZOLE 500 MG PO TABS: 500.0000 mg | ORAL_TABLET | Freq: Two times a day (BID) | ORAL | 0 refills | Status: DC

## 2024-02-13 NOTE — Progress Notes (Signed)
 Bv treated with flagyl . Message to patient sent.

## 2024-02-14 ENCOUNTER — Encounter: Admitting: Obstetrics and Gynecology

## 2024-02-14 ENCOUNTER — Ambulatory Visit: Admitting: Licensed Clinical Social Worker

## 2024-02-14 DIAGNOSIS — F331 Major depressive disorder, recurrent, moderate: Secondary | ICD-10-CM | POA: Diagnosis not present

## 2024-02-14 DIAGNOSIS — F411 Generalized anxiety disorder: Secondary | ICD-10-CM

## 2024-02-14 NOTE — Progress Notes (Signed)
 [facility] Counselor/Therapist Progress Note  Patient ID: Caitlin Butler, MRN: [id number]    Date: 02/14/24  Time Spent: 106  pm - 200 pm : 54 Minutes  Treatment Type: Individual Therapy.  Reported Symptoms: Patient reports that her PCP recommended that she seek therapy. She states that she does experience anxiety and depression. Patient reports that she feels she is in a daze due to her emotions.    Mental Status Exam: Appearance:  Casual     Behavior: Appropriate  Motor: Normal  Speech/Language:  Clear and Coherent  Affect: Depressed  Mood: depressed  Thought process: normal  Thought content:   WNL  Sensory/Perceptual disturbances:   WNL  Orientation: oriented to person, place, time/date, and situation  Attention: Good  Concentration: Good  Memory: WNL  Fund of knowledge:  Good  Insight:   Good  Judgment:  Good  Impulse Control: Good    Risk Assessment: Danger to Self:  No Self-injurious Behavior: No Danger to Others: No Duty to Warn:no Physical Aggression / Violence:No  Access to Firearms a concern: No  Gang Involvement:No    Subjective:    Caitlin Butler participated in person from office, located at [employer]. Caitlin Butler consented to treatment. Therapist participated from office, located at [facility].   Caitlin Butler presented for her session reporting that not much has changed. She reports that she was ask by her mother to come and take her out for the weekend but she declined. She reports that her mother often asks her to take her shopping and then says she has no money and expects her to pay for her purchases. Patient reports that she just didn't feel it this weekend. She reports that she loves her mother and tries to do right as difficult as it is, but sometimes she just can't be around her. Patient reports that she was upset about her daughter in law on Saturday. She reports that her son left for work that morning and when she got up with  the kids she stayed on the computer till nearly 9 that night. She states the babies came in at bedtime came in and said they were hungry. She fed them noodles and sent them to bed. Patient reports that she has access to their outdoor cameras and her son had brought one in to charge and she was able to see her all day long. She reports that she didn't interact at all with the children until nearly 8 pm when the youngest said he was hungry. She said that she went back to the computer and when her husband came home, she got up and went to bed. Patient states that she was so angry but she can't say anything because it will affect the whole family dynamic. She states that she is worried about the kids. Patient reports that she has learned her place and does not attempt to do anything that would affect her relationship with her children and grandchildren. Patient reports continued anxiety and depression and states both have been increased by the recent news that she will be losing her job in January due to her department closing.  Clinician actively listened and provided support and encouragement via verbal interaction. Clinician processed with patient her feelings about her mother and her right to say no and set boundaries. Clinician also processed with patient her frustration at her daughter in law for not caring for the kids, the home and her son as she should. Clinician praised patient for respecting  their privacy and relationship as to not interfere. However, Clinician did address that at anytime if she felt the children were in danger she would have every right to say something. Clinician discussed options for employment with patient and her plans for her future. Patient states she is hoping to work part time in order to spend more time traveling to see her family more. Clinician and patient processed how this will be good in order to allow her time to enjoy some self care.  Caitlin Butler was actively engaged in  discussion during session. Caitlin Butler was polite and cooperative and is feeling more comfortable with Clinician to open up and share more of her feelings. Patient is motivated for treatment and is insightful. This proves to be helpful in patient being able to problem solve and make healthy choices. Patient is to use CBT, mindfulness and coping skills to help manage decrease symptoms associated with their diagnosis. Verbally express understanding of the relationship between feelings of depression, anxiety and their impact on thinking patterns and behaviors. Verbalize an understanding of the role that distorted thinking plays in creating fears, excessive worry, and ruminations. Treatment planning to be reviewed by 01/04/2025.   Interventions: Cognitive Behavioral Therapy, Dialectical Behavioral Therapy, Motivational Interviewing, and Solution-Oriented/Positive Psychology  Diagnosis: Major Depressive Disorder, Moderate recurrent, Generalized Anxiety Disorder    Damien Junk MSW, LCSW/DATE 02/14/2024

## 2024-02-16 ENCOUNTER — Other Ambulatory Visit: Payer: Self-pay

## 2024-02-16 MED ORDER — PAXLOVID (300/100) 20 X 150 MG & 10 X 100MG PO TBPK: 3.0000 | ORAL_TABLET | Freq: Two times a day (BID) | ORAL | 0 refills | Status: AC

## 2024-02-22 ENCOUNTER — Inpatient Hospital Stay: Admit: 2024-02-22 | Payer: PRIVATE HEALTH INSURANCE | Primary: Family Medicine

## 2024-02-22 DIAGNOSIS — M79671 Pain in right foot: Principal | ICD-10-CM

## 2024-02-22 NOTE — Progress Notes (Signed)
 Jill Kaufman  DOB: [birth year]  Primary: [employer] ([employer])  [place] [employer] [place] [facility]  [address]  [place] [address]  Phone: [phone]  Fax: [phone] Plan Frequency: 2x a week for up to 90 days (potentially 1x a week for up to 90 days for pt preference)  Plan of Care/Certification Expiration Date: 03/27/24        Plan of Care/Certification Expiration Date:  Plan of Care/Certification Expiration Date: 03/27/24    Frequency/Duration: Plan Frequency: 2x a week for up to 90 days (potentially 1x a week for up to 90 days for pt preference)      Time In/Out:   Time In: 1100  Time Out: 1140      PT Visit Info:    Progress Note Counter: 4      Visit Count:  4    OUTPATIENT PHYSICAL THERAPY:   Treatment Note 02/22/2024       Episode  (BLE foot pain )               Treatment Diagnosis:    Pain in both feet  Medical/Referring Diagnosis:    Pain of both heels    Referring Physician:  Ethyl Elspeth HERO, MD  MD Orders:  PT Eval and Treat   Return MD Appt:     Date of Onset:  No data recorded   Allergies:   Codeine  Restrictions/Precautions:   None      Interventions Planned (Treatment may consist of any combination of the following):     See Assessment Note    Subjective Comments:   Pt reports bilateral foot/ankle pain in the heel, slight progress     Initial Pain Level: 2/10  Post Session Pain Level: 2/10    Medications Last Reviewed: 02/22/2024  Updated Objective Findings:  None Today  Treatment   THERAPEUTIC EXERCISE: (15  minutes):    Exercises per grid below to improve mobility, strength, balance and coordination.  Required visual, verbal, manual and tactile cues to promote proper body alignment, promote proper body posture, promote proper body mechanics and promote proper body breathing techniques.  Progressed resistance, range, repetitions and complexity of movement as indicated.   Date:  12/28/23 Date:  01/15/24    Date:  02/02/24   Date:02/22/24     Activity/Exercise Parameters Parameters Parameters    Pt ed on biomechanics, PT POC, HEP, outcomes, prognosis, BLE strengthening education, BLE ROM education, ankle/foot differential dx of DJD, plantar fascia tightness, talocrural joint dysfunction, posterior tibial tendon dysfunction, TA weakness dysfunction, PF weakness dysfunction, toe intrinsic weakness, Achilles tendinopathy, calcaneal heel spur education, fallen arch education, windlass mechanism dysfunction, stress fx, meta tarsal bone fx education, neurogenic pain syndrome, diabetes polyneuropathy education, red flag s/s of stress fx, soft tissue ligamentous ruptures   23 mins 10 mins      SCI fit, skilled trunk reciprocation, BLE strengthening, BLE ROM and CV endurance/stamina training.     10 mins  10 mins  10 mins    DF  stretch BLE   3 mins ea foot  3 mins ea foot                     THERAPEUTIC ACTIVITY: (  minutes):    Therapeutic activities per grid below to improve mobility, strength, balance and coordination.Required visual, verbal, manual and tactile cues to promote motor control of bilateral, lower extremity(s).   Date:  Date:   Date:     Activity/Exercise Parameters Parameters Parameters                       NEUROMUSCULAR RE-EDUCATION: (23 minutes):    Exercise/activities per grid below to improve balance, coordination, kinesthetic sense, posture and proprioception.  Required visual, verbal, manual and tactile cues to promote motor control of bilateral, lower extremity(s).   Date:  01/15/24   Date:  02/02/24   Date:  02/22/24     Activity/Exercise Parameters Parameters Parameters   Towel crunches, cues for toe intrinsic activation, arch activation, medial arch control  X 40  X 40 X 40   Seated DF AROM, cues for TA activation  X 30 BLE  X 30 BLE  X 40   Seated PF AROM cues for arch control, toe intrinsic activation, gastroc/soleus activation  X 30 BLE  X 30 BLE  X 40   Seated INV/PF, posterior tibial isolation, toe  intrinsic activation  X 40  X 40  X 40     MANUAL THERAPY: ( 5 minutes):   Joint mobilization, Soft tissue mobilization, Manipulation and Manual lymphatic drainage was utilized and necessary because of the patient's restricted joint motion, painful spasm, loss of articular motion and restricted motion of soft tissue.    Date:   Date:  01/15/24   Date:  02/02/24     Activity/Exercise Parameters Parameters Parameters   Supine BLE Foot IASTM plantar fascia  5 mIns  5 mins  5 mins                 MODALITIES: ( minutes):         Therapy in order to provide analgesia, relieve muscle spasm and reduce inflammation and edema.            Treatment/Session Summary:    Treatment Assessment:   Pt did perform GRF testing and resulted with LLE= supination/posterior tibial weakness (PF +INV) and RLE= flat feet/pronation/decreased DF AROM/PROM.  Will progress prn     Communication/Consultation:  None today  Equipment provided today:  None  Recommendations/Intent for next treatment session: Next visit will focus on BLE strengthening, BLE ROM and WB progressions/stamina training.    >Total Treatment Billable Duration:  38 minutes   Time In: 1100  Time Out: 1140     Cathlyn Searing, PT         Charge Capture  Events  MedBridge Portal  Appt Desk  Attendance Report     Future Appointments   Date Time Provider Department Center   02/29/2024 11:00 AM Searing Cathlyn, PT [facility]   03/07/2024 11:00 AM Searing Cathlyn, PT [facility]   03/12/2024 11:00 AM Searing Cathlyn, PT [facility]   03/19/2024 11:00 AM Searing Cathlyn, PT [facility]   03/26/2024 11:00 AM Searing Cathlyn, PT [facility]   03/29/2024  8:30 AM PST LAB PST [facility] ECC DEP   04/03/2024  9:15 AM SFE MAM BI ROOM 3 SFERMAM SFE   04/05/2024  8:30 AM Ethyl Elspeth HERO, MD PST [facility] ECC DEP   05/17/2024  1:00 PM PERIPHERAL [facility] GCC   05/17/2024  2:00 PM Fermin Maryelizabeth HERO, APRN - CNP [facility] AMB   11/22/2024  1:00 PM PERIPHERAL [facility] GCC   11/22/2024  2:00 PM Mertha Garnette DEL, MD [facility] AMB

## 2024-02-29 ENCOUNTER — Inpatient Hospital Stay: Admit: 2024-02-29 | Payer: PRIVATE HEALTH INSURANCE | Primary: Family Medicine

## 2024-02-29 NOTE — Progress Notes (Signed)
 Jill Kaufman  DOB: [birth year]  Primary: [employer] ([employer])  [place]: [employer] [place] LOC[place] [facility]  [address]  [place] [address]  Phone: [phone]  Fax: [phone] Plan Frequency: 2x a week for up to 90 days (potentially 1x a week for up to 90 days for pt preference)  Plan of Care/Certification Expiration Date: 03/27/24        Plan of Care/Certification Expiration Date:  Plan of Care/Certification Expiration Date: 03/27/24    Frequency/Duration: Plan Frequency: 2x a week for up to 90 days (potentially 1x a week for up to 90 days for pt preference)      Time In/Out:   Time In: 1100  Time Out: 1141      PT Visit Info:    Progress Note Counter: 5      Visit Count:  5    OUTPATIENT PHYSICAL THERAPY:   Treatment Note 02/29/2024       Episode  (BLE foot pain )               Treatment Diagnosis:    Pain in both feet  Medical/Referring Diagnosis:    Pain of both heels    Referring Physician:  Ethyl Elspeth HERO, MD  MD Orders:  PT Eval and Treat   Return MD Appt:     Date of Onset:  No data recorded   Allergies:   Codeine  Restrictions/Precautions:   None      Interventions Planned (Treatment may consist of any combination of the following):     See Assessment Note    Subjective Comments:   Pt reports bilateral foot/ankle pain in the heel, slight progress     Initial Pain Level: 2/10  Post Session Pain Level: 2/10    Medications Last Reviewed: 02/29/2024  Updated Objective Findings:  None Today  Treatment   THERAPEUTIC EXERCISE: (15  minutes):    Exercises per grid below to improve mobility, strength, balance and coordination.  Required visual, verbal, manual and tactile cues to promote proper body alignment, promote proper body posture, promote proper body mechanics and promote proper body breathing techniques.  Progressed resistance, range, repetitions and complexity of movement as indicated.   Date:  02/02/24   Date:02/22/24   Date:02/29/24      Activity/Exercise Parameters     Pt ed on biomechanics, PT POC, HEP, outcomes, prognosis, BLE strengthening education, BLE ROM education, ankle/foot differential dx of DJD, plantar fascia tightness, talocrural joint dysfunction, posterior tibial tendon dysfunction, TA weakness dysfunction, PF weakness dysfunction, toe intrinsic weakness, Achilles tendinopathy, calcaneal heel spur education, fallen arch education, windlass mechanism dysfunction, stress fx, meta tarsal bone fx education, neurogenic pain syndrome, diabetes polyneuropathy education, red flag s/s of stress fx, soft tissue ligamentous ruptures        SCI fit, skilled trunk reciprocation, BLE strengthening, BLE ROM and CV endurance/stamina training.    10 mins  10 mins  10 mins    DF  stretch BLE  3 mins ea foot                    THERAPEUTIC ACTIVITY: (  minutes):    Therapeutic activities per grid below to improve mobility, strength, balance and coordination.Required visual, verbal, manual and tactile cues to promote motor control of bilateral, lower extremity(s).   Date:   Date:   Date:     Activity/Exercise Parameters Parameters Parameters  NEUROMUSCULAR RE-EDUCATION: (23 minutes):    Exercise/activities per grid below to improve balance, coordination, kinesthetic sense, posture and proprioception.  Required visual, verbal, manual and tactile cues to promote motor control of bilateral, lower extremity(s).   Date:  01/15/24   Date:  02/02/24   Date:  02/22/24   Date: 02/29/24     Activity/Exercise Parameters Parameters Parameters    Towel crunches, cues for toe intrinsic activation, arch activation, medial arch control  X 40  X 40 X 40 X 40   Seated DF AROM, cues for TA activation  X 30 BLE  X 30 BLE  X 40 X 40   Seated PF AROM cues for arch control, toe intrinsic activation, gastroc/soleus activation  X 30 BLE  X 30 BLE  X 40 X 40   Seated INV/PF, posterior tibial isolation, toe intrinsic activation  X 40  X 40  X 40 X 40     MANUAL  THERAPY: ( 5 minutes):   Joint mobilization, Soft tissue mobilization, Manipulation and Manual lymphatic drainage was utilized and necessary because of the patient's restricted joint motion, painful spasm, loss of articular motion and restricted motion of soft tissue.    Date:   Date:  01/15/24   Date:  02/02/24   Date:02/29/24     Activity/Exercise Parameters Parameters Parameters    Supine BLE Foot IASTM plantar fascia  5 mIns  5 mins  5 mins 5 mins + hot pack                    MODALITIES: (10 minutes):         Therapy in order to provide analgesia, relieve muscle spasm and reduce inflammation and edema. Hot pack was placed over the plantar fascia and posterior aspect of LLE calf musculature. Pt skin was inspected pre and post modality no abnormalities seen.            Treatment/Session Summary:    Treatment Assessment:   Pt did perform GRF testing and resulted with LLE= supination/posterior tibial weakness (PF +INV) and RLE= flat feet/pronation/decreased DF AROM/PROM.  Pt responded very well to hot pack + IASTM for vasodilation and increased perfusion in the LLE foot. Pt responded well with Will progress prn     Communication/Consultation:  None today  Equipment provided today:  None  Recommendations/Intent for next treatment session: Next visit will focus on BLE strengthening, BLE ROM and WB progressions/stamina training.    >Total Treatment Billable Duration:  38 minutes   Time In: 1100  Time Out: 1141     Cathlyn Searing, PT         Charge Capture  Events  MedBridge Portal  Appt Desk  Attendance Report     Future Appointments   Date Time Provider Department Center   03/07/2024 11:00 AM Searing Cathlyn, [place] [facility]   03/12/2024 11:00 AM Searing Cathlyn, PT [facility]   03/19/2024 11:00 AM Searing Cathlyn, PT [facility]   03/26/2024 11:00 AM Searing Cathlyn, PT [facility]   03/29/2024  8:30 AM PST LAB PST [facility] DEP   04/03/2024  9:15 AM SFE MAM BI ROOM 3 SFERMAM SFE   04/05/2024  8:30 AM Ethyl Elspeth HERO, MD PST [facility] ECC DEP    05/17/2024  1:00 PM PERIPHERAL [facility] GCC   05/17/2024  2:00 PM Fermin Maryelizabeth HERO, APRN - CNP [facility] AMB   11/22/2024  1:00 PM PERIPHERAL [facility] GCC   11/22/2024  2:00 PM Dyar,  Garnette DEL, MD [facility]

## 2024-03-03 ENCOUNTER — Other Ambulatory Visit: Payer: Self-pay | Admitting: Internal Medicine

## 2024-03-03 DIAGNOSIS — F411 Generalized anxiety disorder: Secondary | ICD-10-CM

## 2024-03-06 ENCOUNTER — Ambulatory Visit: Admitting: Licensed Clinical Social Worker

## 2024-03-07 ENCOUNTER — Inpatient Hospital Stay: Admit: 2024-03-07 | Payer: PRIVATE HEALTH INSURANCE | Primary: Family Medicine

## 2024-03-07 NOTE — Progress Notes (Signed)
 Jill Kaufman  DOB: [birth year]  Primary: [employer] ([employer])  Secondary: [employer] [place] [facility]  [address]  [place] [address]  Phone: [phone]  Fax: [phone] Plan Frequency: 2x a week for up to 90 days (potentially 1x a week for up to 90 days for pt preference)  Plan of Care/Certification Expiration Date: 03/27/24        Plan of Care/Certification Expiration Date:  Plan of Care/Certification Expiration Date: 03/27/24    Frequency/Duration: Plan Frequency: 2x a week for up to 90 days (potentially 1x a week for up to 90 days for pt preference)      Time In/Out:   Time In: 1100  Time Out: 1141      PT Visit Info:    Progress Note Counter: 6      Visit Count:  6    OUTPATIENT PHYSICAL THERAPY:   Treatment Note 03/07/2024       Episode  (BLE foot pain )               Treatment Diagnosis:    Pain in both feet  Medical/Referring Diagnosis:    Pain of both heels    Referring Physician:  Ethyl Elspeth HERO, MD  MD Orders:  PT Eval and Treat   Return MD Appt:     Date of Onset:  No data recorded   Allergies:   Codeine  Restrictions/Precautions:   None      Interventions Planned (Treatment may consist of any combination of the following):     See Assessment Note    Subjective Comments:   Pt reports bilateral foot/ankle pain in the heel, slight progress     Initial Pain Level: 2/10  Post Session Pain Level: 2/10    Medications Last Reviewed: 03/07/2024  Updated Objective Findings:  None Today  Treatment   THERAPEUTIC EXERCISE: (15  minutes):    Exercises per grid below to improve mobility, strength, balance and coordination.  Required visual, verbal, manual and tactile cues to promote proper body alignment, promote proper body posture, promote proper body mechanics and promote proper body breathing techniques.  Progressed resistance, range, repetitions and complexity of movement as indicated.   Date:  02/02/24   Date:02/22/24   Date:02/29/24      Activity/Exercise Parameters     Pt ed on biomechanics, PT POC, HEP, outcomes, prognosis, BLE strengthening education, BLE ROM education, ankle/foot differential dx of DJD, plantar fascia tightness, talocrural joint dysfunction, posterior tibial tendon dysfunction, TA weakness dysfunction, PF weakness dysfunction, toe intrinsic weakness, Achilles tendinopathy, calcaneal heel spur education, fallen arch education, windlass mechanism dysfunction, stress fx, meta tarsal bone fx education, neurogenic pain syndrome, diabetes polyneuropathy education, red flag s/s of stress fx, soft tissue ligamentous ruptures        SCI fit, skilled trunk reciprocation, BLE strengthening, BLE ROM and CV endurance/stamina training.    10 mins  10 mins  10 mins    DF  stretch BLE  3 mins ea foot                    THERAPEUTIC ACTIVITY: (  minutes):    Therapeutic activities per grid below to improve mobility, strength, balance and coordination.Required visual, verbal, manual and tactile cues to promote motor control of bilateral, lower extremity(s).   Date:   Date:   Date:     Activity/Exercise Parameters Parameters Parameters  NEUROMUSCULAR RE-EDUCATION: (23 minutes):    Exercise/activities per grid below to improve balance, coordination, kinesthetic sense, posture and proprioception.  Required visual, verbal, manual and tactile cues to promote motor control of bilateral, lower extremity(s).   Date:  01/15/24   Date:  02/02/24   Date:  02/22/24   Date: 02/29/24   Date:03/07/24     Activity/Exercise Parameters Parameters Parameters     Towel crunches, cues for toe intrinsic activation, arch activation, medial arch control  X 40  X 40 X 40 X 40 X 40   Seated DF AROM, cues for TA activation  X 30 BLE  X 30 BLE  X 40 X 40 X 40   Seated PF AROM cues for arch control, toe intrinsic activation, gastroc/soleus activation  X 30 BLE  X 30 BLE  X 40  X 40   Seated INV/PF, posterior tibial isolation, toe intrinsic activation  X 40  X  40  X 40 X 40 X 40   Prone PF/DF ART IASTM + neuro-re education, strain/counter strain/contract-relax PNF    5 mins     Seated DF/Toe instrinsic/plantar fascia neuro re-education + IASTM plantar fascia     5 mins       MANUAL THERAPY: ( 5 minutes):   Joint mobilization, Soft tissue mobilization, Manipulation and Manual lymphatic drainage was utilized and necessary because of the patient's restricted joint motion, painful spasm, loss of articular motion and restricted motion of soft tissue.    Date:   Date:  01/15/24   Date:  02/02/24   Date:02/29/24     Activity/Exercise Parameters Parameters Parameters    Supine BLE Foot IASTM plantar fascia  5 mIns  5 mins  5 mins 5 mins + hot pack                    MODALITIES: (10 minutes):         Therapy in order to provide analgesia, relieve muscle spasm and reduce inflammation and edema. Hot pack was placed over the plantar fascia and posterior aspect of LLE calf musculature. Pt skin was inspected pre and post modality no abnormalities seen.            Treatment/Session Summary:    Treatment Assessment:   Pt did perform GRF testing and resulted with LLE= supination/posterior tibial weakness (PF +INV) and RLE= flat feet/pronation/decreased DF AROM/PROM.  Pt responded very well to hot pack + IASTM for vasodilation and increased perfusion in the LLE foot. Pt responded well with hot pack and neuro re-education exercises. Pt is doing well each week with PT. Will progress prn     Communication/Consultation:  None today  Equipment provided today:  None  Recommendations/Intent for next treatment session: Next visit will focus on BLE strengthening, BLE ROM and WB progressions/stamina training.    >Total Treatment Billable Duration:  38 minutes   Time In: 1100  Time Out: 1141     Cathlyn Searing, PT         Charge Capture  Events  MedBridge Portal  Appt Desk  Attendance Report     Future Appointments   Date Time Provider Department Center   03/12/2024 11:00 AM Searing Cathlyn, [place] [facility]    03/19/2024 11:00 AM Searing Cathlyn, PT [facility]   03/26/2024 11:00 AM Searing Cathlyn, PT [facility]   03/29/2024  8:30 AM PST LAB PST [facility] E[facility] DEP   04/03/2024  9:15 AM SFE MAM BI ROOM 3 SFERMAM SFE  04/05/2024  8:30 AM Ethyl Elspeth HERO, MD PST [facility] ECC DEP   05/17/2024  1:00 PM PERIPHERAL [facility] GCC   05/17/2024  2:00 PM Fermin Maryelizabeth HERO, APRN - CNP [facility]   11/22/2024  1:00 PM PERIPHERAL [facility] GCC   11/22/2024  2:00 PM Mertha Garnette DEL, MD [facility]

## 2024-03-11 ENCOUNTER — Encounter: Payer: Self-pay | Admitting: Obstetrics & Gynecology

## 2024-03-11 ENCOUNTER — Other Ambulatory Visit: Payer: Self-pay | Admitting: Obstetrics & Gynecology

## 2024-03-11 DIAGNOSIS — N950 Postmenopausal bleeding: Secondary | ICD-10-CM

## 2024-03-11 MED ORDER — MISOPROSTOL 200 MCG PO TABS: ORAL_TABLET | ORAL | 0 refills | Status: DC

## 2024-03-11 NOTE — Progress Notes (Signed)
 Pre op orders placed. Pre op cytotec order placed

## 2024-03-12 ENCOUNTER — Inpatient Hospital Stay: Admit: 2024-03-12 | Payer: PRIVATE HEALTH INSURANCE | Primary: Family Medicine

## 2024-03-12 ENCOUNTER — Other Ambulatory Visit: Payer: Self-pay

## 2024-03-12 ENCOUNTER — Encounter
Admission: RE | Admit: 2024-03-12 | Discharge: 2024-03-12 | Disposition: A | Discharge status: Home / Self Care | Source: Ambulatory Visit | Attending: Obstetrics & Gynecology | Admitting: Obstetrics & Gynecology

## 2024-03-12 HISTORY — DX: Postmenopausal bleeding: N95.0

## 2024-03-12 NOTE — Progress Notes (Signed)
 Apolinar Sammie Molt  DOB: 11/27/1965  Primary: [employer] ([employer])  [place]: [employer] [place] [facility]  [address]  [place] [address]  Phone: [phone]  Fax: [phone] Plan Frequency: 2x a week for up to 90 days (potentially 1x a week for up to 90 days for pt preference)  Plan of Care/Certification Expiration Date: 03/27/24        Plan of Care/Certification Expiration Date:  Plan of Care/Certification Expiration Date: 03/27/24    Frequency/Duration: Plan Frequency: 2x a week for up to 90 days (potentially 1x a week for up to 90 days for pt preference)      Time In/Out:   Time In: 1100  Time Out: 1142      PT Visit Info:    Progress Note Counter: 7      Visit Count:  7    OUTPATIENT PHYSICAL THERAPY:   Treatment Note 03/12/2024       Episode  (BLE foot pain )               Treatment Diagnosis:    Pain in both feet  Medical/Referring Diagnosis:    Pain of both heels    Referring Physician:  Ethyl Elspeth HERO, MD  MD Orders:  PT Eval and Treat   Return MD Appt:     Date of Onset:  No data recorded   Allergies:   Codeine  Restrictions/Precautions:   None      Interventions Planned (Treatment may consist of any combination of the following):     See Assessment Note    Subjective Comments:   Pt reports bilateral foot/ankle pain in the heel, slight progress, some soreness last week in the plantar medial aspect of the LLE foot/ankle    Initial Pain Level: 5/10  Post Session Pain Level: 2/10    Medications Last Reviewed: 03/12/2024  Updated Objective Findings:  None Today  Treatment   THERAPEUTIC EXERCISE: (15  minutes):    Exercises per grid below to improve mobility, strength, balance and coordination.  Required visual, verbal, manual and tactile cues to promote proper body alignment, promote proper body posture, promote proper body mechanics and promote proper body breathing techniques.  Progressed resistance, range, repetitions and complexity  of movement as indicated.   Date:02/22/24   Date:02/29/24   Date:03/12/24     Activity/Exercise      Pt ed on biomechanics, PT POC, HEP, outcomes, prognosis, BLE strengthening education, BLE ROM education, ankle/foot differential dx of DJD, plantar fascia tightness, talocrural joint dysfunction, posterior tibial tendon dysfunction, TA weakness dysfunction, PF weakness dysfunction, toe intrinsic weakness, Achilles tendinopathy, calcaneal heel spur education, fallen arch education, windlass mechanism dysfunction, stress fx, meta tarsal bone fx education, neurogenic pain syndrome, diabetes polyneuropathy education, red flag s/s of stress fx, soft tissue ligamentous ruptures        SCI fit, skilled trunk reciprocation, BLE strengthening, BLE ROM and CV endurance/stamina training.    10 mins  10 mins  10 mins    DF  stretch BLE         THERAPEUTIC ACTIVITY: (  minutes):    Therapeutic activities per grid below to improve mobility, strength, balance and coordination.Required visual, verbal, manual and tactile cues to promote motor control of bilateral, lower extremity(s).   Date:   Date:   Date:     Activity/Exercise Parameters Parameters Parameters  NEUROMUSCULAR RE-EDUCATION: (23 minutes):    Exercise/activities per grid below to improve balance, coordination, kinesthetic sense, posture and proprioception.  Required visual, verbal, manual and tactile cues to promote motor control of bilateral, lower extremity(s).   Date:  02/22/24   Date: 02/29/24   Date:03/07/24   Date:03/12/24     Activity/Exercise Parameters      Towel crunches, cues for toe intrinsic activation, arch activation, medial arch control  X 40 X 40 X 40 X 40   Seated DF AROM, cues for TA activation  X 40 X 40 X 40 X 40   Seated PF AROM cues for arch control, toe intrinsic activation, gastroc/soleus activation  X 40  X 40 X 40   Seated INV/PF, posterior tibial isolation, toe intrinsic activation  X 40 X 40 X 40 X 40   Prone PF/DF ART IASTM +  neuro-re education, strain/counter strain/contract-relax PNF  5 mins   5 mins    Seated DF/Toe instrinsic/plantar fascia neuro re-education + IASTM plantar fascia   5 mins   5 mins      MANUAL THERAPY: ( 5 minutes):   Joint mobilization, Soft tissue mobilization, Manipulation and Manual lymphatic drainage was utilized and necessary because of the patient's restricted joint motion, painful spasm, loss of articular motion and restricted motion of soft tissue.    Date:  02/02/24   Date:02/29/24   Date:03/12/24     Activity/Exercise Parameters     Supine BLE Foot IASTM plantar fascia  5 mins 5 mins + hot pack  5 mins + hot pack                 MODALITIES: (10 minutes):         Therapy in order to provide analgesia, relieve muscle spasm and reduce inflammation and edema. Hot pack was placed over the plantar fascia and posterior aspect of LLE calf musculature. Pt skin was inspected pre and post modality no abnormalities seen.            Treatment/Session Summary:    Treatment Assessment:   Pt did perform GRF testing and resulted with LLE= supination/posterior tibial weakness (PF +INV) and RLE= flat feet/pronation/decreased DF AROM/PROM.  Pt responded very well to hot pack + IASTM for vasodilation and increased perfusion in the LLE foot. Pt responded well with hot pack and neuro re-education exercises. Pt is doing well each week with PT. Pt reported less pain and stiffness post tx session, pt looks to be normalizing with her gait pattern.  Will progress prn     Communication/Consultation:  None today  Equipment provided today:  None  Recommendations/Intent for next treatment session: Next visit will focus on BLE strengthening, BLE ROM and WB progressions/stamina training.    >Total Treatment Billable Duration:  38 minutes   Time In: 1100  Time Out: 1142     Cathlyn Searing, PT         Charge Capture  Events  MedBridge Portal  Appt Desk  Attendance Report     Future Appointments   Date Time Provider Department Center   03/19/2024  11:00 AM Searing Cathlyn, [place] [facility]   03/26/2024 11:00 AM Searing Cathlyn, PT [facility]   03/29/2024  8:30 AM PST LAB PST [facility] ECC DEP   04/03/2024  9:15 AM SFE MAM BI ROOM 3 SFERMAM SFE   04/05/2024  8:30 AM Ethyl Elspeth HERO, MD PST [facility] ECC DEP   05/17/2024  1:00 PM PERIPHERAL [facility] GCC  05/17/2024  2:00 PM Fermin Maryelizabeth HERO, APRN - CNP [facility] AMB   11/22/2024  1:00 PM PERIPHERAL [facility] GCC   11/22/2024  2:00 PM Mertha Garnette DEL, MD [facility]

## 2024-03-12 NOTE — Patient Instructions (Addendum)
 Your procedure is scheduled on: MONDAY 03/18/24 Report to the Registration Desk on the 1st floor of the [facility]. To find out your arrival time, please call [phone] between 1PM - 3PM on: FRIDAY 03/15/24 If your arrival time is 6:00 am, do not arrive before that time as the [facility] entrance doors do not open until 6:00 am.  REMEMBER: Instructions that are not followed completely may result in serious medical risk, up to and including death; or upon the discretion of your surgeon and anesthesiologist your surgery may need to be rescheduled.  Do not eat food after midnight the night before surgery.  No gum chewing or hard candies.   One week prior to surgery: Stop Anti-inflammatories (NSAIDS) such as Advil, Aleve, Ibuprofen, Motrin, Naproxen, Naprosyn and Aspirin based products such as Excedrin, Goody's Powder, BC Powder.  Stop ANY OVER THE COUNTER supplements until after surgery.  You may however, continue to take Tylenol if needed for pain up until the day of surgery.   Continue taking all of your other prescription medications up until the day of surgery.  ON THE DAY OF SURGERY ONLY TAKE THESE MEDICATIONS WITH SIPS OF WATER:  escitalopram  (LEXAPRO )   Use inhalers on the day of surgery and bring to the hospital.  No Alcohol for 24 hours before or after surgery.  No Smoking including e-cigarettes for 24 hours before surgery.  No chewable tobacco products for at least 6 hours before surgery.  No nicotine patches on the day of surgery.  Do not use any recreational drugs for at least a week (preferably 2 weeks) before your surgery.  Please be advised that the combination of cocaine and anesthesia may have negative outcomes, up to and including death. If you test positive for cocaine, your surgery will be cancelled.  On the morning of surgery brush your teeth with toothpaste and water, you may rinse your mouth with mouthwash if you wish. Do not swallow any toothpaste or  mouthwash.  Do not wear jewelry, make-up, hairpins, clips or nail polish.   For welded (permanent) jewelry: bracelets, anklets, waist bands, etc.  Please have this removed prior to surgery.  If it is not removed, there is a chance that hospital personnel will need to cut it off on the day of surgery.   Do not wear lotions, powders, or perfumes.   Do not shave body hair from the neck down 48 hours before surgery.  Contact lenses, hearing aids and dentures may not be worn into surgery.  Do not bring valuables to the hospital. [facility] is not responsible for any missing/lost belongings or valuables.   Bring your C-PAP to the hospital in case you may have to spend the night.   Notify your doctor if there is any change in your medical condition (cold, fever, infection).  Wear comfortable clothing (specific to your surgery type) to the hospital.  After surgery, you can help prevent lung complications by doing breathing exercises.  Take deep breaths and cough every 1-2 hours. Your doctor may order a device called an Incentive Spirometer to help you take deep breaths. When coughing or sneezing, hold a pillow firmly against your incision with both hands. This is called "splinting." Doing this helps protect your incision. It also decreases belly discomfort.  If you are being discharged the day of surgery, you will not be allowed to drive home. You will need a responsible individual to drive you home and stay with you for 24 hours after surgery.  If you are taking public transportation, you will need to have a responsible individual with you.  Please call the Pre-admissions Testing Dept. at [phone] if you have any questions about these instructions.  Surgery Visitation Policy:   Patients having surgery or a procedure may have two visitors.  Children under the age of 24 must have an adult with them who is not the patient.  Merchandiser, retail to address health-related  social needs:  [email or web address]

## 2024-03-14 ENCOUNTER — Encounter
Admission: RE | Admit: 2024-03-14 | Discharge: 2024-03-14 | Disposition: A | Discharge status: Home / Self Care | Source: Ambulatory Visit | Attending: Obstetrics & Gynecology | Admitting: Obstetrics & Gynecology

## 2024-03-14 DIAGNOSIS — N950 Postmenopausal bleeding: Secondary | ICD-10-CM | POA: Diagnosis not present

## 2024-03-14 DIAGNOSIS — Z01812 Encounter for preprocedural laboratory examination: Secondary | ICD-10-CM | POA: Diagnosis present

## 2024-03-14 LAB — TYPE AND SCREEN
ABO/RH(D): AB POS
Antibody Screen: NEGATIVE

## 2024-03-18 ENCOUNTER — Ambulatory Visit: Admitting: Internal Medicine

## 2024-03-19 ENCOUNTER — Inpatient Hospital Stay: Admit: 2024-03-19 | Payer: PRIVATE HEALTH INSURANCE | Primary: Family Medicine

## 2024-03-19 DIAGNOSIS — M79671 Pain in right foot: Principal | ICD-10-CM

## 2024-03-19 NOTE — Progress Notes (Signed)
 "Jill Kaufman  DOB: 1965-12-20  Primary: [employer] ([employer])  Secondary: [employer] [place] LOC[place] [facility]  [address]  [place] [address]  Phone: [phone]  Fax: [phone] Plan Frequency: 2x a week for up to 90 days (potentially 1x a week for up to 90 days for pt preference)  Plan of Care/Certification Expiration Date: 03/27/24        Plan of Care/Certification Expiration Date:  Plan of Care/Certification Expiration Date: 03/27/24    Frequency/Duration: Plan Frequency: 2x a week for up to 90 days (potentially 1x a week for up to 90 days for pt preference)      Time In/Out:   Time In: 1100  Time Out: 1140      PT Visit Info:    Progress Note Counter: 8      Visit Count:  8    OUTPATIENT PHYSICAL THERAPY:   Treatment Note 03/19/2024       Episode  (BLE foot pain )               Treatment Diagnosis:    Pain in both feet  Medical/Referring Diagnosis:    Pain of both heels    Referring Physician:  Ethyl Elspeth HERO, MD  MD Orders:  PT Eval and Treat   Return MD Appt:     Date of Onset:  No data recorded   Allergies:   Codeine  Restrictions/Precautions:   None      Interventions Planned (Treatment may consist of any combination of the following):     See Assessment Note    Subjective Comments:   Pt reports this is her best week, less pain and stiffness     Initial Pain Level: 3/10  Post Session Pain Level: 1/10    Medications Last Reviewed: 03/19/2024  Updated Objective Findings:  None Today  Treatment   THERAPEUTIC EXERCISE: (15  minutes):    Exercises per grid below to improve mobility, strength, balance and coordination.  Required visual, verbal, manual and tactile cues to promote proper body alignment, promote proper body posture, promote proper body mechanics and promote proper body breathing techniques.  Progressed resistance, range, repetitions and complexity of movement as indicated.   Date:02/22/24   Date:02/29/24   Date:03/12/24    Date:03/19/24     Activity/Exercise       Pt ed on biomechanics, PT POC, HEP, outcomes, prognosis, BLE strengthening education, BLE ROM education, ankle/foot differential dx of DJD, plantar fascia tightness, talocrural joint dysfunction, posterior tibial tendon dysfunction, TA weakness dysfunction, PF weakness dysfunction, toe intrinsic weakness, Achilles tendinopathy, calcaneal heel spur education, fallen arch education, windlass mechanism dysfunction, stress fx, meta tarsal bone fx education, neurogenic pain syndrome, diabetes polyneuropathy education, red flag s/s of stress fx, soft tissue ligamentous ruptures         SCI fit, skilled trunk reciprocation, BLE strengthening, BLE ROM and CV endurance/stamina training.    10 mins  10 mins  10 mins  10 mins    DF  stretch BLE          THERAPEUTIC ACTIVITY: (  minutes):    Therapeutic activities per grid below to improve mobility, strength, balance and coordination.Required visual, verbal, manual and tactile cues to promote motor control of bilateral, lower extremity(s).   Date:   Date:   Date:     Activity/Exercise Parameters Parameters Parameters  NEUROMUSCULAR RE-EDUCATION: (23 minutes):    Exercise/activities per grid below to improve balance, coordination, kinesthetic sense, posture and proprioception.  Required visual, verbal, manual and tactile cues to promote motor control of bilateral, lower extremity(s).   Date:03/07/24   Date:03/12/24   Date:03/19/24     Activity/Exercise      Towel crunches, cues for toe intrinsic activation, arch activation, medial arch control  X 40 X 40 X 40   Seated DF AROM, cues for TA activation  X 40 X 40 X 40   Seated PF AROM cues for arch control, toe intrinsic activation, gastroc/soleus activation  X 40 X 40 X 40   Seated INV/PF, posterior tibial isolation, toe intrinsic activation  X 40 X 40 X 40   Prone PF/DF ART IASTM + neuro-re education, strain/counter strain/contract-relax PNF  5 mins  5 mins    Seated  DF/Toe instrinsic/plantar fascia neuro re-education + IASTM plantar fascia   5 mins  5 mins      MANUAL THERAPY: ( 5 minutes):   Joint mobilization, Soft tissue mobilization, Manipulation and Manual lymphatic drainage was utilized and necessary because of the patient's restricted joint motion, painful spasm, loss of articular motion and restricted motion of soft tissue.    Date:  02/02/24   Date:02/29/24   Date:03/12/24   Date:03/19/24     Activity/Exercise Parameters      Supine BLE Foot IASTM plantar fascia  5 mins 5 mins + hot pack  5 mins + hot pack 5 mins + hot pack                   MODALITIES: (10 minutes):         Therapy in order to provide analgesia, relieve muscle spasm and reduce inflammation and edema. Hot pack was placed over the plantar fascia and posterior aspect of LLE calf musculature. Pt skin was inspected pre and post modality no abnormalities seen.            Treatment/Session Summary:    Treatment Assessment:   Pt did perform GRF testing and resulted with LLE= supination/posterior tibial weakness (PF +INV) and RLE= flat feet/pronation/decreased DF AROM/PROM.  Pt responded very well to hot pack + IASTM for vasodilation and increased perfusion in the LLE foot. Pt responded well with hot pack and neuro re-education exercises. Pt is doing well each week with PT. Pt reported less pain and stiffness post tx session, pt looks to be normalizing with her gait pattern. Pt also looks like she has returning STN positioning on both BLE's now. Will progress prn     Communication/Consultation:  None today  Equipment provided today:  None  Recommendations/Intent for next treatment session: Next visit will focus on BLE strengthening, BLE ROM and WB progressions/stamina training.    >Total Treatment Billable Duration:  38 minutes   Time In: 1100  Time Out: 1140     Cathlyn Searing, PT         Charge Capture  Events  MedBridge Portal  Appt Desk  Attendance Report     Future Appointments   Date Time Provider Department  Center   03/26/2024 11:00 AM Searing Cathlyn, PT [facility]   03/29/2024  8:30 AM PST LAB PST [facility] DEP   04/02/2024  9:30 AM Searing Cathlyn, PT [facility] SFD   04/03/2024  9:15 AM SFE MAM BI ROOM 3 [facility]ER[facility]AM SFE   04/05/2024  8:30 AM Ethyl Elspeth HERO, MD PST [facility] ECC DEP   04/09/2024  9:30 AM Annett Ny, PT [facility] SFD   04/16/2024  9:30 AM Annett Ny, PT [facility] SFD   04/23/2024  9:30 AM Annett Ny, PT [facility] SFD   04/30/2024  9:30 AM Annett Ny, PT [facility] SFD   05/17/2024  1:00 PM PERIPHERAL [facility] GCC   05/17/2024  2:00 PM Fermin Maryelizabeth HERO, APRN - CNP [facility] AMB   11/22/2024  1:00 PM PERIPHERAL [facility] GCC   11/22/2024  2:00 PM Mertha Garnette DEL, MD [facility] AMB       "

## 2024-03-26 ENCOUNTER — Inpatient Hospital Stay: Admit: 2024-03-26 | Payer: PRIVATE HEALTH INSURANCE | Primary: Family Medicine

## 2024-03-26 NOTE — Therapy Recertification (Signed)
 "     Jill Kaufman  DOB: 1965-09-15  Primary: [employer] ([employer]  Secondary: [employer] [place] [facility]  [address]  [place] [address]  Phone: [phone]  Fax: [phone] Plan Frequency: 2x a week for up to 90 days (potentially 1x a week for up to 90 days for pt preference)    Plan of Care/Certification Expiration Date: 06/24/24        Plan of Care/Certification Expiration Date:  Plan of Care/Certification Expiration Date: 06/24/24    Frequency/Duration: Plan Frequency: 2x a week for up to 90 days (potentially 1x a week for up to 90 days for pt preference)      Time In/Out:   Time In: 1100  Time Out: 1143      PT Visit Info:    Progress Note Counter: 9      Visit Count:  9                OUTPATIENT PHYSICAL THERAPY:             Recertification 03/26/2024               Episode (BLE foot pain )         Treatment Diagnosis:     Pain in both feet  Medical/Referring Diagnosis:    Pain of both heels    Referring Physician:  Ethyl Elspeth HERO, MD    MD Orders:  PT Eval and Treat   Return MD Appt:    Date of Onset:      Allergies:  Codeine  Restrictions/Precautions:    None      Medications Last Reviewed: 03/26/2024     SUBJECTIVE   History of Injury/Illness (Reason for Referral):  Pt arrives today with BLE foot/ankle pain in both heels. Pt reports that her LLE foot pain > RLE foot pain. Pt reports that sedentary behavior causes her to have a lot of heel pain. Pt reports that walking prolonged distances also gives her a decent pain. Pt reports that her pain is in the posterior-medial aspect of the LLE>RLE. Pt reports some medial plantar fascia pain that does get exacerbated with prolonged WB.  Pt reports that her ADLs are minimally affected, she can perform most home duties without issues however does feel pain. Pt denies any numbness and tingling. Pt denies swelling. Pt denies any MOI or trauma that is related to this initial incident.      Patient Stated Goal(s):  would like to move w/o BLE foot pain    Initial Pain Level:  2/10   Post Session Pain Level: 2/10    Past Medical History/Comorbidities:   Jill Kaufman  has a past medical history of Breast cancer (HCC), GERD (gastroesophageal reflux disease), High cholesterol, History of therapeutic radiation, Nausea & vomiting, PONV (postoperative nausea and vomiting), and Thyroid disease.  Jill Kaufman  has a past surgical history that includes gyn; Breast lumpectomy (Right, 05/04/2018); Breast biopsy (Right, 05/04/2018); US  PLACE BREAST LOC DEVICE 1ST LESION RIGHT (Right, 05/04/2018); Hysterectomy; and Colonoscopy (N/A, 08/09/2021).  Social History/Living Environment:   Patient lives with their family    Prior Level of Function/Work/Activity:   Prior Level of Function: Independent      Learning:   Does the patient/guardian have any barriers to learning?: No barriers  Will there be a co-learner?: No  What is the preferred language of the patient/guardian?: English  Is an interpreter required?: No  How does the patient/guardian prefer to learn new concepts?: Listening; Demonstration    Fall Risk Scale:   Morse Total Score: 0       OBJECTIVE     NATURE OF CONDITION Date: 12/28/23 Date: 03/26/24     Highest level of pain 7 5   Lowest level of pain 2 1   Aggravating factors Activity use/WB  Activity use/WB    Alleviating factors rest rest   Frequency of symptoms Everyday  Intermittent    Description of symptoms Soreness/ache Soreness/ache   Location of tenderness Plantar aspect BLE heels Plantar aspect BLE heels     RANGE OF MOTION Date: 12/28/23 Date:  03/26/24     RIGHT LEFT RIGHT LEFT   Ankle Eversion  15 21 20  35   Ankle Inversion 25 15 40 35   Ankle Dorsiflexion 15 11 16 16    Ankle Plantarflexion WNL WNL WNL WNL     Strength Date: 12/28/23 Date:  03/26/24     RIGHT LEFT RIGHT LEFT   Ankle Eversion  3- 3- 3+ 3+   Ankle Inversion 3- 3- 3+ 3+   Ankle Dorsiflexion 3- 3- 3+ 3+   Ankle Plantarflexion 3- 3- 3+ 3+         NEUROLOGICAL SCREEN  Dermatomes: WNL  Reflexes: WNL   Outcome Measure:   Tool Used: FOOT AND ANKLE ABILITY MEASURE  Score:  Initial: 57 Most Recent: 95 (Date: 03/26/24 )   Interpretation of Score: For the Activities of Daily Living, there are 21 questions each scored on a 5 point scale with 0 representing Unable to do and 4 representing No difficulty.  The lower the score, the greater the functional disability. 84/84 represents no disability.  Minimal detectable change is 5.7 points.  With the addition of the 8 questions in the Sports Subscale, there are 29 questions, each scored on a 5 point scale with 0 representing Unable to do and 4 representing No difficulty.  The lower the score, the greater the functional disability. 116/116 represents no disability.  Minimal detectable change is 12.3 points.    ASSESSMENT   Initial Assessment:  Pt is a 58 yo female who presents to the OPPT clinic with BLE foot/ankle pain. Pt has received Dx imaging via xray which revealed, Calcaneal spur and minimal degenerative change in the first toe MTP joint. Otherwise unremarkable foot radiographs. 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  Pt would benefit from skilled PT in order to address her impairments.     Pt has reached her first progress visit, Pt has attended 10 PT visits while completing 5/8 DC goals, with the remaining goals in progress towards completion. Pt is being seen for BLE foot/ankle pain. Pt has made progress with BLE strength, BLE WB stamina, and improved pain intensity. Pt still reports intermittent pain at times, intermittent stiffness and pain at times when taking her first steps with WB. Pt would benefit to continue with skilled PT in order to address her remaining impairments. Pt;s PT POC has been extended to, 06/24/24      Therapy Problem List: (Impacting functional limitations):    Increased Pain,  Decreased Strength, Decreased ROM, Decreased Functional Mobility, Decreased Independence with Home Exercise Program, Decreased Posture, Decreased Body Mechanics, and Decreased Activity Tolerance/Endurance*   Therapy Prognosis:   Good     Initial Assessment Complexity:   Moderate Complexity       PLAN  Effective Dates: 03/26/24 TO Plan of Care/Certification Expiration Date: 06/24/24     Frequency/Duration: Plan Frequency: 2x a week for up to 90 days (potentially 1x a week for up to 90 days for pt preference)      Interventions Planned (Treatment may consist of any combination of the following):    Home Exercise Program (HEP), Manual Therapy, Neuromuscular Re-education/Strengthening, Pain Management, Positioning, Range of Motion (ROM), Therapeutic Activites, and Therapeutic Exercise/Strengthening   Goals: (Goals have been discussed and agreed upon with patient.)  Short-Term Functional Goals: Time Frame: 4 weeks   Pt will achieve a 65/116 FAAM score in order to improve ADLs and function MET  Pt will be compliant with HEP MET  Pt will achieve a VAS pain score of 5/10 in order to improve ADLS and function MET  Pt will improve all BLE Strength to 4-/5 in order to improve ADLs and function MET  Discharge Goals: Time Frame: 8 weeks   Pt will achieve a 75/116 FAAM score in order to improve ADLs and function MET  Pt will be independent with HEP in progress  Pt will achieve a VAS pain score of 0/10 in order to improve ADLS and function in progress  Pt will improve all BLE Strength to 5/5 in order to improve ADLs and function in progress         Medical Necessity:   > Skilled intervention continues to be required due to Skilled Physical Therapy services are needed for the pt's deficits above that affect the pt to perform ADLs, participate within their community, and to perform functional movement patterns.    Reason For Services/Other Comments:  > Patient continues to require skilled intervention due to Skilled Physical  Therapy services are needed for the pt's deficits above that affect the pt to perform ADLs, participate within their community, and to perform functional movement patterns.      Regarding Jill Kaufman's therapy, I certify that the treatment plan above will be carried out by a therapist or under their direction.  Thank you for this referral,  Cathlyn Searing, PT     Referring Physician Signature: Ethyl Elspeth HERO, MD _______________________________ Date _____________        Charge Capture  Events  Appt Desk  Attendance Report        "

## 2024-03-26 NOTE — Progress Notes (Signed)
 "Jill Kaufman  DOB: 11/10/1965  Primary: [employer] ([employer])  [place] [employer] [place] [facility]  [address]  [place] [address]  Phone: [phone]  Fax: [phone] Plan Frequency: 2x a week for up to 90 days (potentially 1x a week for up to 90 days for pt preference)  Plan of Care/Certification Expiration Date: 06/24/24        Plan of Care/Certification Expiration Date:  Plan of Care/Certification Expiration Date: 06/24/24    Frequency/Duration: Plan Frequency: 2x a week for up to 90 days (potentially 1x a week for up to 90 days for pt preference)      Time In/Out:   Time In: 1100  Time Out: 1143      PT Visit Info:    Progress Note Counter: 1      Visit Count:  9    OUTPATIENT PHYSICAL THERAPY:   Treatment Note 03/26/2024       Episode  (BLE foot pain )               Treatment Diagnosis:    Pain in both feet  Medical/Referring Diagnosis:    Pain of both heels    Referring Physician:  Ethyl Elspeth HERO, MD  MD Orders:  PT Eval and Treat   Return MD Appt:     Date of Onset:  No data recorded   Allergies:   Codeine  Restrictions/Precautions:   None      Interventions Planned (Treatment may consist of any combination of the following):     See Assessment Note    Subjective Comments:   Pt reports this is her best week, less pain and stiffness     Initial Pain Level: 3/10  Post Session Pain Level: 1/10    Medications Last Reviewed: 03/26/2024  Updated Objective Findings:  None Today  Treatment   THERAPEUTIC EXERCISE: (15  minutes):    Exercises per grid below to improve mobility, strength, balance and coordination.  Required visual, verbal, manual and tactile cues to promote proper body alignment, promote proper body posture, promote proper body mechanics and promote proper body breathing techniques.  Progressed resistance, range, repetitions and complexity of movement as indicated.   Date:03/12/24   Date:03/19/24   Date:03/26/24      Activity/Exercise      Pt ed on biomechanics, PT POC, HEP, outcomes, prognosis, BLE strengthening education, BLE ROM education, ankle/foot differential dx of DJD, plantar fascia tightness, talocrural joint dysfunction, posterior tibial tendon dysfunction, TA weakness dysfunction, PF weakness dysfunction, toe intrinsic weakness, Achilles tendinopathy, calcaneal heel spur education, fallen arch education, windlass mechanism dysfunction, stress fx, meta tarsal bone fx education, neurogenic pain syndrome, diabetes polyneuropathy education, red flag s/s of stress fx, soft tissue ligamentous ruptures        SCI fit, skilled trunk reciprocation, BLE strengthening, BLE ROM and CV endurance/stamina training.    10 mins  10 mins  10 mins    DF  stretch BLE         THERAPEUTIC ACTIVITY: (  minutes):    Therapeutic activities per grid below to improve mobility, strength, balance and coordination.Required visual, verbal, manual and tactile cues to promote motor control of bilateral, lower extremity(s).   Date:   Date:   Date:     Activity/Exercise Parameters Parameters Parameters                       NEUROMUSCULAR  RE-EDUCATION: (23 minutes):    Exercise/activities per grid below to improve balance, coordination, kinesthetic sense, posture and proprioception.  Required visual, verbal, manual and tactile cues to promote motor control of bilateral, lower extremity(s).   Date:03/19/24   Date:03/26/24   Activity/Exercise     Towel crunches, cues for toe intrinsic activation, arch activation, medial arch control  X 40 X 40   Seated DF AROM, cues for TA activation  X 40 X 40   Seated PF AROM cues for arch control, toe intrinsic activation, gastroc/soleus activation  X 40 X 40   Seated INV/PF, posterior tibial isolation, toe intrinsic activation  X 40 X 40   Prone PF/DF ART IASTM + neuro-re education, strain/counter strain/contract-relax PNF 5 mins  5 mins    Seated DF/Toe instrinsic/plantar fascia neuro re-education + IASTM plantar  fascia  5 mins  5 mins      MANUAL THERAPY: ( 5 minutes):   Joint mobilization, Soft tissue mobilization, Manipulation and Manual lymphatic drainage was utilized and necessary because of the patient's restricted joint motion, painful spasm, loss of articular motion and restricted motion of soft tissue.    Date:03/19/24      Activity/Exercise     Supine BLE Foot IASTM plantar fascia  5 mins + hot pack                MODALITIES: (10 minutes):         Therapy in order to provide analgesia, relieve muscle spasm and reduce inflammation and edema. Hot pack was placed over the plantar fascia and posterior aspect of LLE calf musculature. Pt skin was inspected pre and post modality no abnormalities seen.            Treatment/Session Summary:    Treatment Assessment:   Pt did perform GRF testing and resulted with LLE= supination/posterior tibial weakness (PF +INV) and RLE= flat feet/pronation/decreased DF AROM/PROM.     See therapy recert     Communication/Consultation:  None today  Equipment provided today:  None  Recommendations/Intent for next treatment session: Next visit will focus on BLE strengthening, BLE ROM and WB progressions/stamina training.    >Total Treatment Billable Duration:  38 minutes   Time In: 1100  Time Out: 1143     Cathlyn Searing, PT         Charge Capture  Events  MedBridge Portal  Appt Desk  Attendance Report     Future Appointments   Date Time Provider Department Center   03/29/2024  8:30 AM PST LAB PST [facility] ECC DEP   04/02/2024  9:30 AM Searing Cathlyn, PT [facility] SFD   04/03/2024  9:15 AM SFE MAM BI ROOM 3 [facility]ERMAM SFE   04/05/2024  8:30 AM Ethyl Elspeth HERO, MD PST [facility] ECC DEP   04/09/2024  9:30 AM Searing Cathlyn, PT [facility] SFD   04/16/2024  9:30 AM Searing Cathlyn, PT [facility] SFD   04/23/2024  9:30 AM Searing Cathlyn, PT [facility] SFD   04/30/2024  9:30 AM Searing Cathlyn, PT [facility] SFD   05/17/2024  1:00 PM PERIPHERAL [facility] GCC   05/17/2024  2:00 PM Fermin Maryelizabeth HERO, APRN - CNP [facility] AMB   11/22/2024  1:00 PM  PERIPHERAL [facility] GCC   11/22/2024  2:00 PM Mertha Garnette DEL, MD [facility] AMB       "

## 2024-03-29 ENCOUNTER — Other Ambulatory Visit: Admit: 2024-03-29 | Discharge: 2024-03-29 | Payer: PRIVATE HEALTH INSURANCE | Primary: Family Medicine

## 2024-03-29 DIAGNOSIS — E034 Atrophy of thyroid (acquired): Principal | ICD-10-CM

## 2024-03-29 LAB — COMPREHENSIVE METABOLIC PANEL
ALT: 24 U/L (ref 8–45)
AST: 20 U/L (ref 15–37)
Albumin/Globulin Ratio: 1.3 (ref 1.0–1.9)
Albumin: 4 g/dL (ref 3.5–5.0)
Alk Phosphatase: 101 U/L (ref 35–104)
Anion Gap: 10 mmol/L (ref 7–16)
BUN: 20 mg/dL (ref 6–23)
CO2: 26 mmol/L (ref 20–29)
Calcium: 9.4 mg/dL (ref 8.8–10.2)
Chloride: 104 mmol/L (ref 98–107)
Creatinine: 0.83 mg/dL (ref 0.60–1.10)
Est, Glom Filt Rate: 82 ml/min/1.73m2 (ref >60)
Globulin: 3.1 g/dL (ref 2.3–3.5)
Glucose: 87 mg/dL (ref 70–99)
Potassium: 4.5 mmol/L (ref 3.5–5.1)
Sodium: 140 mmol/L (ref 136–145)
Total Bilirubin: 0.5 mg/dL (ref 0.0–1.2)
Total Protein: 7.1 g/dL (ref 6.3–8.2)

## 2024-03-29 LAB — LIPID PANEL
Chol/HDL Ratio: 4.1 (ref 0.0–5.0)
Cholesterol, Total: 200 mg/dL (ref 0–200)
HDL: 48 mg/dL (ref 40–60)
LDL Cholesterol: 123 mg/dL — ABNORMAL HIGH (ref 0–100)
Triglycerides: 144 mg/dL (ref 0–150)
VLDL Cholesterol Calculated: 29 mg/dL — ABNORMAL HIGH (ref 6–23)

## 2024-03-29 LAB — TSH: TSH, 3rd Generation: 1.9 u[IU]/mL (ref 0.270–4.200)

## 2024-04-01 ENCOUNTER — Encounter

## 2024-04-02 ENCOUNTER — Inpatient Hospital Stay: Admit: 2024-04-02 | Payer: PRIVATE HEALTH INSURANCE | Primary: Family Medicine

## 2024-04-02 NOTE — Progress Notes (Signed)
 "Jill Kaufman  DOB: 26-Feb-1966  Primary: [employer])  Secondary: [employer] [place] LOCAL [facility]  [address]  [place] [address]  Phone: [phone]  Fax: [phone] Plan Frequency: 2x a week for up to 90 days (potentially 1x a week for up to 90 days for pt preference)  Plan of Care/Certification Expiration Date: 06/24/24        Plan of Care/Certification Expiration Date:  Plan of Care/Certification Expiration Date: 06/24/24    Frequency/Duration: Plan Frequency: 2x a week for up to 90 days (potentially 1x a week for up to 90 days for pt preference)      Time In/Out:   Time In: 1415  Time Out: 1457      PT Visit Info:    Progress Note Counter: 2      Visit Count:  10    OUTPATIENT PHYSICAL THERAPY:   Treatment Note 04/02/2024       Episode  (BLE foot pain )               Treatment Diagnosis:    Pain in both feet  Medical/Referring Diagnosis:    Pain of both heels    Referring Physician:  Ethyl Elspeth HERO, MD  MD Orders:  PT Eval and Treat   Return MD Appt:     Date of Onset:  No data recorded   Allergies:   Codeine  Restrictions/Precautions:   None      Interventions Planned (Treatment may consist of any combination of the following):     See Assessment Note    Subjective Comments:   Pt reports this is her best week, less pain and stiffness     Initial Pain Level: 3/10  Post Session Pain Level: 1/10    Medications Last Reviewed: 04/02/2024  Updated Objective Findings:  None Today  Treatment   THERAPEUTIC EXERCISE: (15  minutes):    Exercises per grid below to improve mobility, strength, balance and coordination.  Required visual, verbal, manual and tactile cues to promote proper body alignment, promote proper body posture, promote proper body mechanics and promote proper body breathing techniques.  Progressed resistance, range, repetitions and complexity of movement as indicated.   Date:03/12/24   Date:03/19/24   Date:03/26/24      Activity/Exercise      Pt ed on biomechanics, PT POC, HEP, outcomes, prognosis, BLE strengthening education, BLE ROM education, ankle/foot differential dx of DJD, plantar fascia tightness, talocrural joint dysfunction, posterior tibial tendon dysfunction, TA weakness dysfunction, PF weakness dysfunction, toe intrinsic weakness, Achilles tendinopathy, calcaneal heel spur education, fallen arch education, windlass mechanism dysfunction, stress fx, meta tarsal bone fx education, neurogenic pain syndrome, diabetes polyneuropathy education, red flag s/s of stress fx, soft tissue ligamentous ruptures        SCI fit, skilled trunk reciprocation, BLE strengthening, BLE ROM and CV endurance/stamina training.    10 mins  10 mins  10 mins    DF  stretch BLE         THERAPEUTIC ACTIVITY: (  minutes):    Therapeutic activities per grid below to improve mobility, strength, balance and coordination.Required visual, verbal, manual and tactile cues to promote motor control of bilateral, lower extremity(s).   Date:   Date:   Date:     Activity/Exercise Parameters Parameters Parameters                       NEUROMUSCULAR  RE-EDUCATION: (23 minutes):    Exercise/activities per grid below to improve balance, coordination, kinesthetic sense, posture and proprioception.  Required visual, verbal, manual and tactile cues to promote motor control of bilateral, lower extremity(s).   Date:03/19/24   Date:03/26/24 Date:04/02/24     Activity/Exercise      Towel crunches, cues for toe intrinsic activation, arch activation, medial arch control  X 40 X 40    Seated DF AROM, cues for TA activation  X 40 X 40 X 40   Seated PF AROM cues for arch control, toe intrinsic activation, gastroc/soleus activation  X 40 X 40 X 40   Seated INV/PF, posterior tibial isolation, toe intrinsic activation  X 40 X 40 X 40   Prone PF/DF ART IASTM + neuro-re education, strain/counter strain/contract-relax PNF 5 mins  5 mins  5 mins    Seated DF/Toe instrinsic/plantar  fascia neuro re-education + IASTM plantar fascia  5 mins  5 mins  5 mins      MANUAL THERAPY: ( 5 minutes):   Joint mobilization, Soft tissue mobilization, Manipulation and Manual lymphatic drainage was utilized and necessary because of the patient's restricted joint motion, painful spasm, loss of articular motion and restricted motion of soft tissue.    Date:03/19/24      Activity/Exercise     Supine BLE Foot IASTM plantar fascia  5 mins + hot pack                MODALITIES: (10 minutes):         Therapy in order to provide analgesia, relieve muscle spasm and reduce inflammation and edema. Hot pack was placed over the plantar fascia and posterior aspect of LLE calf musculature. Pt skin was inspected pre and post modality no abnormalities seen.            Treatment/Session Summary:    Treatment Assessment:   Pt did perform GRF testing and resulted with LLE= supination/posterior tibial weakness (PF +INV) and RLE= flat feet/pronation/decreased DF AROM/PROM. Pt reports improvement with pain in the heel and ambulatory distance. Pt will progress prn     Communication/Consultation:  None today  Equipment provided today:  None  Recommendations/Intent for next treatment session: Next visit will focus on BLE strengthening, BLE ROM and WB progressions/stamina training.    >Total Treatment Billable Duration:  38 minutes   Time In: 1415  Time Out: 1457     Cathlyn Searing, PT         Charge Capture  Events  MedBridge Portal  Appt Desk  Attendance Report     Future Appointments   Date Time Provider Department Center   04/03/2024  9:15 AM S[facility] MAM BI ROOM 3 SFERMAM SFE   04/05/2024  8:30 AM Ethyl Elspeth HERO, MD PST [facility] ECC DEP   04/09/2024  9:30 AM Searing Cathlyn, PT S[facility]PT SFD   04/16/2024  9:30 AM Searing Cathlyn, PT [facility] SFD   04/23/2024  9:30 AM Searing Cathlyn, PT [facility] SFD   04/30/2024  9:30 AM Searing Cathlyn, PT [facility]PT SFD   05/16/2024 12:50 PM PERIPHERAL [facility] GCC   05/16/2024  2:00 PM Alexandra Domino, APRN - NP [facility] AMB    11/22/2024  1:00 PM PER[facility]HERAL [facility]   11/22/2024  2:00 PM Mertha Garnette DEL, MD [facility] AMB       "

## 2024-04-03 ENCOUNTER — Inpatient Hospital Stay: Admit: 2024-04-03 | Payer: PRIVATE HEALTH INSURANCE | Attending: Internal Medicine | Primary: Family Medicine

## 2024-04-03 VITALS — Ht 64.0 in | Wt 150.0 lb

## 2024-04-03 DIAGNOSIS — Z1231 Encounter for screening mammogram for malignant neoplasm of breast: Principal | ICD-10-CM

## 2024-04-05 ENCOUNTER — Ambulatory Visit
Admit: 2024-04-05 | Discharge: 2024-04-05 | Payer: PRIVATE HEALTH INSURANCE | Attending: Family Medicine | Primary: Family Medicine

## 2024-04-05 VITALS — BP 112/74 | HR 70 | Ht 64.0 in | Wt 150.4 lb

## 2024-04-05 DIAGNOSIS — E034 Atrophy of thyroid (acquired): Principal | ICD-10-CM

## 2024-04-05 MED ORDER — LOVASTATIN 10 MG PO TABS
10 | ORAL_TABLET | Freq: Every day | ORAL | 3 refills | 90.00000 days | Status: AC
Start: 2024-04-05 — End: ?

## 2024-04-05 MED ORDER — LEVOTHYROXINE SODIUM 75 MCG PO TABS
75 | ORAL_TABLET | Freq: Every day | ORAL | 1 refills | 90.00000 days | Status: AC
Start: 2024-04-05 — End: ?

## 2024-04-05 NOTE — Progress Notes (Signed)
 "SUBJECTIVE:   Jill Kaufman is a 58 y.o. female who has a past medical history significant for hypothyroidism, high cholesterol, high triglycerides and breast cancer.  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  This showed osteopenia.  She has since started calcium and vitamin D supplementation.Review of systems reveals no complaints of chest pain, shortness of breath, orthopnea or PND.  GI and GU review of systems is unremarkable.  Current medications are listed in the EMR and reviewed today.      HPI  See above    Past Medical History, Past Surgical History, Family history, Social History, and Medications were all reviewed with the patient today and updated as necessary.       Current Outpatient Medications   Medication Sig Dispense Refill    lovastatin  (MEVACOR ) 10 MG tablet Take 1 tablet by mouth daily 100 tablet 3    levothyroxine  (SYNTHROID ) 75 MCG tablet Take 1 tablet by mouth every morning (before breakfast) 90 tablet 1    anastrozole  (ARIMIDEX ) 1 MG tablet Take 1 tablet by mouth daily 90 tablet 3    Ivermectin 1 % CREA daily       No current facility-administered medications for this visit.     Allergies   Allergen Reactions    Codeine Nausea And Vomiting and Dizziness or Vertigo     Patient Active Problem List   Diagnosis    S/P lumpectomy of breast    Malignant neoplasm of central portion of right breast in female, estrogen receptor positive (HCC)    Abnormal breast finding    Statin intolerance    Abnormal results of liver function studies    Hypothyroidism    Mixed hyperlipidemia    Pain in joint, lower leg    Vitamin D deficiency     Past Medical History:   Diagnosis Date    Breast cancer (HCC)     T1cN0 right breast cancer    GERD (gastroesophageal reflux disease)     no medication at this time (noted 07/30/21)    High cholesterol     History of therapeutic radiation     Nausea & vomiting     PONV (postoperative nausea and vomiting)     Thyroid disease  06/14/2011    Hypothyroidism      Past Surgical History:   Procedure Laterality Date    BREAST BIOPSY Right 05/04/2018    RIGHT BREAST NEEDLE LOCALIZED BIOPSY performed by Jill Fairy SAUNDERS, MD at [facility] MAIN OR    BREAST LUMPECTOMY Right 05/04/2018    RIGHT BREAST LUMPECTOMY performed by Jill Fairy SAUNDERS, MD at [facility] MAIN OR    COLONOSCOPY N/A 08/09/2021    COLORECTAL CANCER SCREENING, NOT HIGH RISK performed by Jill Romans, MD at [facility] ENDOSCOPY    GYN      hysterectomy partial-still has ovaries    HYSTERECTOMY (CERVIX STATUS UNKNOWN)      US  PLACE BREAST LOC DEVICE 1ST LESION RIGHT Right 05/04/2018    US  GUIDED NEEDLE LOC OF RIGHT BREAST 05/04/2018 [facility] RADIOLOGY MAMMO     Family History   Problem Relation Age of Onset    Heart Failure Mother     Heart Attack Mother         late 28's, currently in her 99's    Cancer Father         throat cancer, skin cancer     Diabetes Father     Heart Attack Father  60's, now late 80's    Breast Cancer Neg Hx      Social History     Tobacco Use    Smoking status: Never     Passive exposure: Never    Smokeless tobacco: Never   Substance Use Topics    Alcohol use: Yes     Comment: ocassionally         Review of Systems  See above    OBJECTIVE:  BP 112/74   Pulse 70   Ht 1.626 m (5' 4)   Wt 68.2 kg (150 lb 6.4 oz)   SpO2 100%   BMI 25.82 kg/m      Physical Exam  Constitutional:       General: She is not in acute distress.     Appearance: Normal appearance. She is not ill-appearing.   HENT:      Head: Normocephalic and atraumatic.   Cardiovascular:      Rate and Rhythm: Normal rate and regular rhythm.      Heart sounds: Normal heart sounds. No murmur heard.  Pulmonary:      Effort: Pulmonary effort is normal.      Breath sounds: Normal breath sounds. No wheezing or rhonchi.   Musculoskeletal:         General: Normal range of motion.      Cervical back: Normal range of motion and neck supple.      Right lower leg: No edema.      Left lower leg: No edema.   Skin:     General:  Skin is warm.      Findings: No rash.   Neurological:      Mental Status: She is alert and oriented to person, place, and time.   Psychiatric:         Mood and Affect: Mood normal.         Behavior: Behavior normal.         Thought Content: Thought content normal.         Judgment: Judgment normal.         Medical problems and test results were reviewed with the patient today.         ASSESSMENT and PLAN    1.  Hypothyroidism.  TSH 1.9.  Previously 1.8.  Continue current dose of Synthroid .    2.  High cholesterol.  LDL is 123.  Previously 120.  Liver enzymes remain normal.  Continue statin.    3.  High triglycerides.  Triglycerides 144.  Previously 164.  Continue dietary focus.    4.  History of breast cancer.  Surveillance and follow-up is up-to-date.    5.  Osteopenia.  Continue calcium and vitamin D supplementation.  Repeat bone density in 2 years.    Elements of this note have been dictated using speech recognition software. As a result, errors of speech recognition may have occurred.    "

## 2024-04-05 NOTE — Progress Notes (Signed)
 "  Have you been to the ER, urgent care clinic since your last visit?  Hospitalized since your last visit?"    NO    "Have you seen or consulted any other health care providers outside our system since your last visit?"    NO

## 2024-04-07 MED ORDER — ORAL CARE MOUTH RINSE: 15.0000 mL | Freq: Once | OROMUCOSAL | Status: AC

## 2024-04-07 MED ORDER — CHLORHEXIDINE GLUCONATE 0.12 % MT SOLN
15.0000 mL | Freq: Once | OROMUCOSAL | Status: AC
  Administered 2024-04-08: 15 mL via OROMUCOSAL

## 2024-04-07 MED ORDER — LACTATED RINGERS IV SOLN: INTRAVENOUS | Status: DC

## 2024-04-07 MED ORDER — POVIDONE-IODINE 10 % EX SWAB: 2.0000 | Freq: Once | CUTANEOUS | Status: DC

## 2024-04-08 ENCOUNTER — Encounter: Payer: Self-pay | Admitting: Obstetrics & Gynecology

## 2024-04-08 ENCOUNTER — Ambulatory Visit
Admission: RE | Admit: 2024-04-08 | Discharge: 2024-04-08 | Disposition: A | Discharge status: Home / Self Care | Attending: Obstetrics & Gynecology | Admitting: Obstetrics & Gynecology

## 2024-04-08 ENCOUNTER — Other Ambulatory Visit: Payer: Self-pay

## 2024-04-08 ENCOUNTER — Encounter
Admission: RE | Disposition: A | Discharge status: Home / Self Care | Payer: Self-pay | Source: Home / Self Care | Attending: Obstetrics & Gynecology

## 2024-04-08 ENCOUNTER — Ambulatory Visit: Admitting: Anesthesiology

## 2024-04-08 ENCOUNTER — Ambulatory Visit: Payer: Self-pay | Admitting: Urgent Care

## 2024-04-08 DIAGNOSIS — N840 Polyp of corpus uteri: Secondary | ICD-10-CM

## 2024-04-08 DIAGNOSIS — Z87891 Personal history of nicotine dependence: Secondary | ICD-10-CM | POA: Diagnosis not present

## 2024-04-08 DIAGNOSIS — N950 Postmenopausal bleeding: Secondary | ICD-10-CM | POA: Insufficient documentation

## 2024-04-08 HISTORY — PX: HYSTEROSCOPY WITH D & C: SHX1775

## 2024-04-08 LAB — TYPE AND SCREEN
ABO/RH(D): AB POS
Antibody Screen: NEGATIVE

## 2024-04-08 SURGERY — DILATATION AND CURETTAGE /HYSTEROSCOPY
Anesthesia: General

## 2024-04-08 MED ORDER — LIDOCAINE HCL (CARDIAC) PF 100 MG/5ML IV SOSY
PREFILLED_SYRINGE | INTRAVENOUS | Status: DC | PRN
  Administered 2024-04-08: 100 mg via INTRAVENOUS

## 2024-04-08 MED ORDER — MIDAZOLAM HCL 2 MG/2ML IJ SOLN
INTRAMUSCULAR | Status: AC
  Filled 2024-04-08: qty 2

## 2024-04-08 MED ORDER — EPHEDRINE SULFATE-NACL 50-0.9 MG/10ML-% IV SOSY
PREFILLED_SYRINGE | INTRAVENOUS | Status: DC | PRN
  Administered 2024-04-08 (×2): 10 mg via INTRAVENOUS

## 2024-04-08 MED ORDER — DEXMEDETOMIDINE HCL IN NACL 80 MCG/20ML IV SOLN
INTRAVENOUS | Status: DC | PRN
Start: 2024-04-08 — End: 2024-04-08
  Administered 2024-04-08: 4 ug via INTRAVENOUS
  Administered 2024-04-08: 8 ug via INTRAVENOUS

## 2024-04-08 MED ORDER — PROPOFOL 10 MG/ML IV BOLUS
INTRAVENOUS | Status: AC
  Filled 2024-04-08: qty 20

## 2024-04-08 MED ORDER — OXYCODONE HCL 5 MG PO TABS: 5.0000 mg | ORAL_TABLET | Freq: Once | ORAL | Status: DC | PRN

## 2024-04-08 MED ORDER — FENTANYL CITRATE (PF) 100 MCG/2ML IJ SOLN
INTRAMUSCULAR | Status: DC | PRN
  Administered 2024-04-08: 100 ug via INTRAVENOUS

## 2024-04-08 MED ORDER — DEXAMETHASONE SOD PHOSPHATE PF 10 MG/ML IJ SOLN
INTRAMUSCULAR | Status: DC | PRN
  Administered 2024-04-08: 10 mg via INTRAVENOUS

## 2024-04-08 MED ORDER — FENTANYL CITRATE (PF) 100 MCG/2ML IJ SOLN
INTRAMUSCULAR | Status: AC
  Filled 2024-04-08: qty 2

## 2024-04-08 MED ORDER — FENTANYL CITRATE (PF) 100 MCG/2ML IJ SOLN: 25.0000 ug | INTRAMUSCULAR | Status: DC | PRN

## 2024-04-08 MED ORDER — BUPIVACAINE HCL (PF) 0.5 % IJ SOLN
INTRAMUSCULAR | Status: AC
Start: 2024-04-08 — End: 2024-04-08
  Filled 2024-04-08: qty 30

## 2024-04-08 MED ORDER — MIDAZOLAM HCL (PF) 2 MG/2ML IJ SOLN
INTRAMUSCULAR | Status: DC | PRN
  Administered 2024-04-08: 2 mg via INTRAVENOUS

## 2024-04-08 MED ORDER — BUPIVACAINE HCL 0.5 % IJ SOLN
INTRAMUSCULAR | Status: DC | PRN
  Administered 2024-04-08: 10 mL

## 2024-04-08 MED ORDER — CHLORHEXIDINE GLUCONATE 0.12 % MT SOLN
OROMUCOSAL | Status: AC
  Filled 2024-04-08: qty 15

## 2024-04-08 MED ORDER — PROPOFOL 10 MG/ML IV BOLUS
INTRAVENOUS | Status: DC | PRN
  Administered 2024-04-08: 200 mg via INTRAVENOUS

## 2024-04-08 MED ORDER — LIDOCAINE HCL (PF) 2 % IJ SOLN
INTRAMUSCULAR | Status: AC
  Filled 2024-04-08: qty 5

## 2024-04-08 MED ORDER — GLYCOPYRROLATE 0.2 MG/ML IJ SOLN
INTRAMUSCULAR | Status: DC | PRN
  Administered 2024-04-08: .1 mg via INTRAVENOUS

## 2024-04-08 MED ORDER — IBUPROFEN 600 MG PO TABS: 600.0000 mg | ORAL_TABLET | Freq: Four times a day (QID) | ORAL | 1 refills | Status: DC | PRN

## 2024-04-08 MED ORDER — OXYCODONE HCL 5 MG/5ML PO SOLN: 5.0000 mg | Freq: Once | ORAL | Status: DC | PRN

## 2024-04-08 MED ORDER — 0.9 % SODIUM CHLORIDE (POUR BTL) OPTIME
TOPICAL | Status: DC | PRN
Start: 2024-04-08 — End: 2024-04-08
  Administered 2024-04-08: 500 mL

## 2024-04-08 MED ORDER — KETOROLAC TROMETHAMINE 30 MG/ML IJ SOLN
INTRAMUSCULAR | Status: DC | PRN
  Administered 2024-04-08: 30 mg via INTRAVENOUS

## 2024-04-08 MED ORDER — ONDANSETRON HCL 4 MG/2ML IJ SOLN
INTRAMUSCULAR | Status: DC | PRN
  Administered 2024-04-08: 4 mg via INTRAVENOUS

## 2024-04-08 SURGICAL SUPPLY — 16 items
DRSG TELFA 3X8 NADH STRL (GAUZE/BANDAGES/DRESSINGS) IMPLANT
GLOVE BIO SURGEON STRL SZ7 (GLOVE) ×2 IMPLANT
GOWN STRL REUS W/ TWL LRG LVL3 (GOWN DISPOSABLE) ×4 IMPLANT
KIT TURNOVER CYSTO (KITS) ×2 IMPLANT
MANIFOLD NEPTUNE II (INSTRUMENTS) ×2 IMPLANT
NDL SPNL 18GX3.5 QUINCKE PK (NEEDLE) IMPLANT
NEEDLE SPNL 18GX3.5 QUINCKE PK (NEEDLE) ×2 IMPLANT
PACK DNC HYST (MISCELLANEOUS) ×2 IMPLANT
PAD OB MATERNITY 11 LF (PERSONAL CARE ITEMS) ×1 IMPLANT
PAD PREP OB/GYN DISP 24X41 (PERSONAL CARE ITEMS) ×2 IMPLANT
SCRUB CHG 4% DYNA-HEX 4OZ (MISCELLANEOUS) ×2 IMPLANT
SET IRRIG Y TYPE TUR BLADDER L (SET/KITS/TRAYS/PACK) ×1 IMPLANT
SOLUTION PREP PVP 2OZ (MISCELLANEOUS) ×1 IMPLANT
SYR CONTROL 10ML LL (SYRINGE) ×1 IMPLANT
TRAP FLUID SMOKE EVACUATOR (MISCELLANEOUS) ×1 IMPLANT
WATER STERILE IRR 500ML POUR (IV SOLUTION) ×1 IMPLANT

## 2024-04-08 NOTE — Transfer of Care (Signed)
 Immediate Anesthesia Transfer of Care Note  Patient: Caitlin Butler  Procedure(s) Performed: DILATATION AND CURETTAGE /HYSTEROSCOPY  Patient Location: PACU  Anesthesia Type:General  Level of Consciousness: awake, alert , oriented, and patient cooperative  Airway & Oxygen Therapy: Patient Spontanous Breathing and Patient connected to face mask oxygen  Post-op Assessment: Report given to RN, Post -op Vital signs reviewed and stable, and Patient moving all extremities X 4  Post vital signs: Reviewed and stable  Last Vitals:  Vitals Value Taken Time  BP 138/85 04/08/24 13:06  Temp 97.2 F 04/08/24 1307  Pulse 72 04/08/24 13:07  Resp 17 04/08/24 13:07  SpO2 99 % 04/08/24 13:07  Vitals shown include unfiled device data.  Last Pain:  Vitals:   04/08/24 1011  TempSrc: Temporal  PainSc: 2       Patients Stated Pain Goal: 0 (04/08/24 1011)  Complications: No notable events documented.

## 2024-04-08 NOTE — H&P (Signed)
 Caitlin Butler is an 58 y.o. P3 here for a dilation and curettage. She had some PMB back in August 2025. A pelvic ultrasound showed the following:  Endometrium   The uterus is splayed by hypoechoic material, possibly blood products, measuring 17 mm thick. The echogenic endometrial lining measures 6 mm. No focal abnormality visualized.  I did an EMBX in the office and it was negative, but it would not explain the hypoechoic material seen on the scan.    Patient's last menstrual period was 05/15/2013.    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 No pertinent family history.  Social History:  reports that she has quit smoking. Her smoking use included cigarettes. She has never used smokeless tobacco. She reports current alcohol use of about 6.0 standard drinks of alcohol per week. She reports that she does not use drugs.  Allergies:  Allergies  Allergen Reactions   Penicillins     Childhood rxn     Medications Prior to Admission  Medication Sig Dispense Refill Last Dose/Taking   Cholecalciferol (VITAMIN D3) 1.25 MG (50000 UT) CAPS Take 1 capsule (1.25 mg total) by mouth once a week. (Patient taking differently: Take 50,000 Units by mouth every Monday.) 12 capsule 3 Past Week   Collagen-Vitamin C-Biotin (COLLAGEN PO) Take 1 Scoop by mouth daily. Collagen Peptide Powder   Past Week   escitalopram  (LEXAPRO ) 10 MG tablet TAKE 1 TABLET BY MOUTH ONCE DAILY 30 tablet 2 04/07/2024   Misc Natural Products (GNP GLP-1 DAILY SUPPORT PO) Take 15 mLs by mouth daily. Glp-1 Oral Liquid   Past Week   misoprostol (CYTOTEC) 200 MCG tablet Take 3 pills by mouth the night before dilation and curettage. 3 tablet 0 04/08/2024 at 12:30 AM   PSYLLIUM HUSK  PO Take 1 Dose by mouth daily. 1 tablespoon   Past Week    Review of Systems  Blood pressure (!) 155/98, pulse 85, temperature (!) 96.9 F (36.1 C), temperature source Temporal, resp. rate 16, height 5' 6 (1.676 m), weight 127 kg, last menstrual period 05/15/2013, SpO2 99%. Physical Exam CONSTITUTIONAL: Well-developed, well-nourished female in no acute distress.  PSYCHIATRIC: Normal mood and affect. Normal behavior. Normal judgment and thought content. NEUROLGIC: Alert and oriented to person, place, and time. Normal muscle tone coordination. No cranial nerve deficit noted. HENT:  Normocephalic, atraumatic, External right and left ear normal. Oropharynx is clear and moist EYES: Conjunctivae and EOM are normal. Pupils are equal, round, and reactive to light. No scleral icterus.  NECK: Normal range of motion, supple, no masses.  Normal thyroid.  SKIN: Skin is warm and dry. No rash noted. Not diaphoretic. No erythema. No pallor. CARDIOVASCULAR: Normal heart rate noted, regular rhythm, no murmur. RESPIRATORY: Clear to auscultation bilaterally. Effort and breath sounds normal, no problems with respiration noted. ABDOMEN: Soft, normal bowel sounds, no distention noted.  No tenderness, rebound or guarding.     MUSCULOSKELETAL: Normal range of motion. No tenderness.  No cyanosis, clubbing, or edema.  2+ distal pulses. LYMPHATIC: No Axillary, Supraclavicular, or Inguinal Adenopathy.   Results for orders placed or performed during the hospital encounter of 04/08/24 (from the past 24 hours)  Type and screen [facility]     Status: None   Collection Time: 04/08/24 10:19 AM  Result Value Ref Range   ABO/RH(D) AB POS    Antibody Screen NEG    Sample Expiration      04/11/2024,2359 Performed at [facility], [address] [place], [place] [address]       Assessment/Plan: PMB with hypoechoic material in the uterus Plan for dilation and curettage  She  understands the risks of surgery, including, but not to infection, bleeding, DVTs, damage to bowel, bladder, ureters. She wishes to proceed.    Harland JAYSON Birkenhead 04/08/2024, 12:10 PM

## 2024-04-08 NOTE — Anesthesia Preprocedure Evaluation (Signed)
 Anesthesia Evaluation  Patient identified by MRN, date of birth, ID band Patient awake    Reviewed: Allergy & Precautions, NPO status , Patient's Chart, lab work & pertinent test results  History of Anesthesia Complications Negative for: history of anesthetic complications  Airway Mallampati: III  TM Distance: >3 FB Neck ROM: full    Dental no notable dental hx.    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 No date: Mixed hyperlipidemia No date: Post-menopausal bleeding  Past Surgical History: No date: HERNIA REPAIR     Reproductive/Obstetrics negative OB ROS                              Anesthesia Physical Anesthesia Plan  ASA: 3  Anesthesia Plan: General LMA   Post-op Pain Management: Toradol IV (intra-op)* and Ofirmev IV (intra-op)*   Induction: Intravenous  PONV Risk Score and Plan: 3 and Dexamethasone, Ondansetron, Midazolam and Treatment may vary due to age or medical condition  Airway Management Planned: LMA  Additional Equipment:   Intra-op Plan:   Post-operative Plan: Extubation in OR  Informed Consent: I have reviewed the patients History and Physical, chart, labs and discussed the procedure including the risks, benefits and alternatives for the proposed anesthesia with the patient or authorized representative who has indicated  his/her understanding and acceptance.     Dental Advisory Given  Plan Discussed with: Anesthesiologist, CRNA and Surgeon  Anesthesia Plan Comments: (Patient consented for risks of anesthesia including but not limited to:  - adverse reactions to medications - damage to eyes, teeth, lips or other oral mucosa - nerve damage due to positioning  - sore throat or hoarseness - Damage to heart, brain, nerves, lungs, other parts of body or loss of life  Patient voiced understanding and assent.)         Anesthesia Quick Evaluation

## 2024-04-08 NOTE — Anesthesia Procedure Notes (Signed)
 Procedure Name: LMA Insertion Date/Time: 04/08/2024 12:38 PM  Performed by: Myra Lawless, CRNAPre-anesthesia Checklist: Patient identified, Patient being monitored, Timeout performed, Emergency Drugs available and Suction available Patient Re-evaluated:Patient Re-evaluated prior to induction Oxygen Delivery Method: Circle system utilized Preoxygenation: Pre-oxygenation with 100% oxygen Induction Type: IV induction Ventilation: Mask ventilation without difficulty LMA: LMA inserted LMA Size: 5.0 Tube type: Oral Number of attempts: 1 Placement Confirmation: positive ETCO2 and breath sounds checked- equal and bilateral Tube secured with: Tape Dental Injury: Teeth and Oropharynx as per pre-operative assessment

## 2024-04-08 NOTE — Op Note (Signed)
 04/08/2024  1:03 PM  PATIENT:  Caitlin Butler  58 y.o. female  PRE-OPERATIVE DIAGNOSIS:  PMB  POST-OPERATIVE DIAGNOSIS:  PMB  PROCEDURE:  Procedure(s): DILATATION AND CURETTAGE /HYSTEROSCOPY  SURGEON:  Surgeons and Role:    * Devine Dant, Harland BROCKS, MD - Primary   ANESTHESIA:   local and general  EBL:  minimal  BLOOD ADMINISTERED:none  DRAINS: none   LOCAL MEDICATIONS USED:  MARCAINE     SPECIMEN:  Source of Specimen:  uterine curettings  DISPOSITION OF SPECIMEN:  PATHOLOGY  COUNTS:  YES  TOURNIQUET:  * No tourniquets in log *  DICTATION: .Note written in EPIC  PLAN OF CARE: Discharge to home after PACU  PATIENT DISPOSITION:  PACU - hemodynamically stable.   Delay start of Pharmacological VTE agent (>24hrs) due to surgical blood loss or risk of bleeding: not applicable    The risks, benefits, and alternatives of surgery were explained, understood, and accepted. All questions were answered. Consents were signed. In the operating room general anesthesia was applied without complication, and she was placed in the dorsal lithotomy position. Her vagina was prepped and draped in the usual sterile fashion.  A bimanual exam revealed a normal size and shape anteverted mobile uterus. Her adnexa were nonenlarged. A speculum was placed and a single-tooth tenaculum was used to grasp the anterior lip of her cervix. A total of 10 mL of 0.5% Marcaine was used to perform a paracervical block. Her cervix was carefully and slowly dilated to accommodate a hysteroscope.  Hysteroscope was placed. The uterine cavity was empty, very atrophic. A curettage was done in all quadrants and the fundus of the uterus. A scant amount of tissue was obtained. There was no bleeding noted at the end of the case. She was taken to the recovery room after being extubated. She tolerated the procedure well.

## 2024-04-09 ENCOUNTER — Inpatient Hospital Stay: Admit: 2024-04-09 | Payer: PRIVATE HEALTH INSURANCE | Primary: Family Medicine

## 2024-04-09 ENCOUNTER — Telehealth: Payer: Self-pay | Admitting: Obstetrics & Gynecology

## 2024-04-09 ENCOUNTER — Encounter: Payer: Self-pay | Admitting: Obstetrics & Gynecology

## 2024-04-09 LAB — SURGICAL PATHOLOGY

## 2024-04-09 NOTE — Progress Notes (Signed)
 "Jill Kaufman  DOB: 1966-02-25  Primary: [employer] ([employer])  [place]: [place] [place] [facility]  [address]  [place] [address]  Phone: [phone]  Fax: [phone] Plan Frequency: 2x a week for up to 90 days (potentially 1x a week for up to 90 days for pt preference)  Plan of Care/Certification Expiration Date: 06/24/24        Plan of Care/Certification Expiration Date:  Plan of Care/Certification Expiration Date: 06/24/24    Frequency/Duration: Plan Frequency: 2x a week for up to 90 days (potentially 1x a week for up to 90 days for pt preference)      Time In/Out:   Time In: 0930  Time Out: 1011      PT Visit Info:    Progress Note Counter: 3      Visit Count:  11    OUTPATIENT PHYSICAL THERAPY:   Treatment Note 04/09/2024       Episode  (BLE foot pain )               Treatment Diagnosis:    Pain in both feet  Medical/Referring Diagnosis:    Pain of both heels    Referring Physician:  Ethyl Elspeth HERO, MD  MD Orders:  PT Eval and Treat   Return MD Appt:     Date of Onset:  No data recorded   Allergies:   Codeine  Restrictions/Precautions:   None      Interventions Planned (Treatment may consist of any combination of the following):     See Assessment Note    Subjective Comments:   Pt reports this is her best week again, pt reports almost complete resolution of pain, pt did feel some slight discomfort this past wkd     Initial Pain Level: 2/10  Post Session Pain Level: 1/10    Medications Last Reviewed: 04/09/2024  Updated Objective Findings:  None Today  Treatment   THERAPEUTIC EXERCISE: (15  minutes):    Exercises per grid below to improve mobility, strength, balance and coordination.  Required visual, verbal, manual and tactile cues to promote proper body alignment, promote proper body posture, promote proper body mechanics and promote proper body breathing techniques.  Progressed resistance, range, repetitions and complexity  of movement as indicated.   Date:03/12/24   Date:03/19/24   Date:03/26/24   Date:04/09/24     Activity/Exercise       Pt ed on biomechanics, PT POC, HEP, outcomes, prognosis, BLE strengthening education, BLE ROM education, ankle/foot differential dx of DJD, plantar fascia tightness, talocrural joint dysfunction, posterior tibial tendon dysfunction, TA weakness dysfunction, PF weakness dysfunction, toe intrinsic weakness, Achilles tendinopathy, calcaneal heel spur education, fallen arch education, windlass mechanism dysfunction, stress fx, meta tarsal bone fx education, neurogenic pain syndrome, diabetes polyneuropathy education, red flag s/s of stress fx, soft tissue ligamentous ruptures         SCI fit, skilled trunk reciprocation, BLE strengthening, BLE ROM and CV endurance/stamina training.    10 mins  10 mins  10 mins  10 mins    DF  stretch BLE          THERAPEUTIC ACTIVITY: (  minutes):    Therapeutic activities per grid below to improve mobility, strength, balance and coordination.Required visual, verbal, manual and tactile cues to promote motor control of bilateral, lower extremity(s).   Date:   Date:   Date:     Activity/Exercise Parameters Parameters Parameters  NEUROMUSCULAR RE-EDUCATION: (23 minutes):    Exercise/activities per grid below to improve balance, coordination, kinesthetic sense, posture and proprioception.  Required visual, verbal, manual and tactile cues to promote motor control of bilateral, lower extremity(s).   Date:03/26/24 Date:04/02/24   Date:04/09/24     Activity/Exercise      Towel crunches, cues for toe intrinsic activation, arch activation, medial arch control  X 40     Seated DF AROM, cues for TA activation  X 40 X 40 X 40   Seated PF AROM cues for arch control, toe intrinsic activation, gastroc/soleus activation  X 40 X 40 X 40    X 40 PF + INV PTT    Seated INV/PF, posterior tibial isolation, toe intrinsic activation  X 40 X 40 X 40   Prone PF/DF ART IASTM +  neuro-re education, strain/counter strain/contract-relax PNF 5 mins  5 mins  5 mins    Seated DF/Toe instrinsic/plantar fascia neuro re-education + IASTM plantar fascia  5 mins  5 mins  5 mins      MANUAL THERAPY: ( 5 minutes):   Joint mobilization, Soft tissue mobilization, Manipulation and Manual lymphatic drainage was utilized and necessary because of the patient's restricted joint motion, painful spasm, loss of articular motion and restricted motion of soft tissue.    Date:03/19/24      Activity/Exercise     Supine BLE Foot IASTM plantar fascia  5 mins + hot pack                MODALITIES: (10 minutes):         Therapy in order to provide analgesia, relieve muscle spasm and reduce inflammation and edema. Hot pack was placed over the plantar fascia and posterior aspect of LLE calf musculature. Pt skin was inspected pre and post modality no abnormalities seen.            Treatment/Session Summary:    Treatment Assessment:   Pt did perform GRF testing and resulted with LLE= supination/posterior tibial weakness (PF +INV) and RLE= flat feet/pronation/decreased DF AROM/PROM. Pt reports improvement with pain in the heel and ambulatory distance. Pt reports much greater improvement with pain and stiffness, pt reports that she can almost go the whole week with no pain.  Pt will progress prn     Communication/Consultation:  None today  Equipment provided today:  None  Recommendations/Intent for next treatment session: Next visit will focus on BLE strengthening, BLE ROM and WB progressions/stamina training.    >Total Treatment Billable Duration:  38 minutes   Time In: 0930  Time Out: 1011     Cathlyn Searing, PT         Charge Capture  Events  MedBridge Portal  Appt Desk  Attendance Report     Future Appointments   Date Time Provider Department Center   04/16/2024  9:30 AM Searing Cathlyn, PT [facility]   04/23/2024  9:30 AM Searing Cathlyn, PT [facility]   04/30/2024  9:30 AM Searing Cathlyn, PT [facility]   05/16/2024 12:50 PM PERIPHERAL  [facility]   05/16/2024  2:00 PM Alexandra Domino, APRN - NP [facility]   09/20/2024  8:50 AM PST LAB PST [facility] DEP   09/30/2024  8:40 AM Ethyl Elspeth HERO, MD PST [facility] E[facility] DEP   11/22/2024  1:00 PM PERIPHERAL [facility]   11/22/2024  2:00 PM Mertha Garnette DEL, MD [facility]       "

## 2024-04-09 NOTE — Telephone Encounter (Signed)
 I called to give her the pathology report (all benign) and check on her post op. I got her voice mail and left a message.

## 2024-04-09 NOTE — Anesthesia Postprocedure Evaluation (Signed)
 Anesthesia Post Note  Patient: Caitlin Butler  Procedure(s) Performed: DILATATION AND CURETTAGE /HYSTEROSCOPY  Patient location during evaluation: PACU Anesthesia Type: General Level of consciousness: awake and alert Pain management: pain level controlled Vital Signs Assessment: post-procedure vital signs reviewed and stable Respiratory status: spontaneous breathing, nonlabored ventilation, respiratory function stable and patient connected to nasal cannula oxygen Cardiovascular status: blood pressure returned to baseline and stable Postop Assessment: no apparent nausea or vomiting Anesthetic complications: no   No notable events documented.   Last Vitals:  Vitals:   04/08/24 1330 04/08/24 1343  BP: 122/87 (!) 150/94  Pulse: 78 65  Resp: 13 16  Temp: 36.5 C 36.8 C  SpO2: 96% 100%    Last Pain:  Vitals:   04/08/24 1343  TempSrc: Temporal  PainSc: 0-No pain                 Lendia LITTIE Mae

## 2024-04-09 NOTE — Telephone Encounter (Signed)
 Patient returned voicemail. Discussed pathology results and if she had any issues to give us  a call when needed.

## 2024-04-16 ENCOUNTER — Inpatient Hospital Stay: Admit: 2024-04-16 | Payer: PRIVATE HEALTH INSURANCE | Primary: Family Medicine

## 2024-04-16 DIAGNOSIS — M79671 Pain in right foot: Principal | ICD-10-CM

## 2024-04-16 NOTE — Progress Notes (Signed)
 "Jill Kaufman  DOB: 1966-04-16  Primary: Jill [employer] ([employer])  [place]: [place] [place] [facility]  [address]   [place] [address]  Phone: [phone]  Fax: [phone] Plan Frequency: 2x a week for up to 90 days (potentially 1x a week for up to 90 days for pt preference)  Plan of Care/Certification Expiration Date: 06/24/24        Plan of Care/Certification Expiration Date:  Plan of Care/Certification Expiration Date: 06/24/24    Frequency/Duration: Plan Frequency: 2x a week for up to 90 days (potentially 1x a week for up to 90 days for pt preference)      Time In/Out:   Time In: 0930  Time Out: 1011      PT Visit Info:    Progress Note Counter: 4      Visit Count:  12    OUTPATIENT PHYSICAL THERAPY:   Treatment Note 04/16/2024       Episode  (BLE foot pain )               Treatment Diagnosis:    Pain in both feet  Medical/Referring Diagnosis:    Pain of both heels    Referring Physician:  Ethyl Elspeth HERO, MD  MD Orders:  PT Eval and Treat   Return MD Appt:     Date of Onset:  No data recorded   Allergies:   Codeine  Restrictions/Precautions:   None      Interventions Planned (Treatment may consist of any combination of the following):     See Assessment Note    Subjective Comments:   Pt reports more good days than bad days, still having some discomfort with medial heel pain and medial arch pain at times.     Initial Pain Level: 2/10  Post Session Pain Level: 1/10    Medications Last Reviewed: 04/16/2024  Updated Objective Findings:  None Today  Treatment   THERAPEUTIC EXERCISE: (15  minutes):    Exercises per grid below to improve mobility, strength, balance and coordination.  Required visual, verbal, manual and tactile cues to promote proper body alignment, promote proper body posture, promote proper body mechanics and promote proper body breathing techniques.  Progressed resistance, range, repetitions and complexity of movement as  indicated.   Date:03/26/24   Date:04/09/24   Date:04/16/24     Activity/Exercise      Pt ed on biomechanics, PT POC, HEP, outcomes, prognosis, BLE strengthening education, BLE ROM education, ankle/foot differential dx of DJD, plantar fascia tightness, talocrural joint dysfunction, posterior tibial tendon dysfunction, TA weakness dysfunction, PF weakness dysfunction, toe intrinsic weakness, Achilles tendinopathy, calcaneal heel spur education, fallen arch education, windlass mechanism dysfunction, stress fx, meta tarsal bone fx education, neurogenic pain syndrome, diabetes polyneuropathy education, red flag s/s of stress fx, soft tissue ligamentous ruptures        SCI fit, skilled trunk reciprocation, BLE strengthening, BLE ROM and CV endurance/stamina training.    10 mins  10 mins  10 mins    DF  stretch BLE         THERAPEUTIC ACTIVITY: (  minutes):    Therapeutic activities per grid below to improve mobility, strength, balance and coordination.Required visual, verbal, manual and tactile cues to promote motor control of bilateral, lower extremity(s).   Date:   Date:   Date:     Activity/Exercise Parameters Parameters Parameters  NEUROMUSCULAR RE-EDUCATION: (23 minutes):    Exercise/activities per grid below to improve balance, coordination, kinesthetic sense, posture and proprioception.  Required visual, verbal, manual and tactile cues to promote motor control of bilateral, lower extremity(s).   Date:04/02/24   Date:04/09/24   Date:04/16/24     Activity/Exercise      Towel crunches, cues for toe intrinsic activation, arch activation, medial arch control       Seated DF AROM, cues for TA activation  X 40 X 40 X 40   Seated PF AROM cues for arch control, toe intrinsic activation, gastroc/soleus activation  X 40 X 40    X 40 PF + INV PTT  X 40    X 40 PF + INV PTT    Seated INV/PF, posterior tibial isolation, toe intrinsic activation  X 40 X 40 X 40   Prone PF/DF ART IASTM + neuro-re education,  strain/counter strain/contract-relax PNF 5 mins  5 mins  5 mins    Seated DF/Toe instrinsic/plantar fascia neuro re-education + IASTM plantar fascia  5 mins  5 mins  5 mins      MANUAL THERAPY: ( 5 minutes):   Joint mobilization, Soft tissue mobilization, Manipulation and Manual lymphatic drainage was utilized and necessary because of the patient's restricted joint motion, painful spasm, loss of articular motion and restricted motion of soft tissue.    Date:03/19/24   04/16/24     Activity/Exercise     Supine BLE Foot IASTM plantar fascia/prone LLE gastroc/soleus/PTT 5 mins + hot pack 5 mins + hot pack               MODALITIES: (10 minutes):         Therapy in order to provide analgesia, relieve muscle spasm and reduce inflammation and edema. Hot pack was placed over the plantar fascia and posterior aspect of LLE calf musculature. Pt skin was inspected pre and post modality no abnormalities seen.            Treatment/Session Summary:    Treatment Assessment:   Pt did perform GRF testing and resulted with LLE= supination/posterior tibial weakness (PF +INV) and RLE= flat feet/pronation/decreased DF AROM/PROM. Pt reports improvement with pain in the heel and ambulatory distance. Pt reports much greater improvement with pain and stiffness, pt reports that she can almost go the whole week with no pain. Pt is starting to show good signs of strength preservation but still gets talocrural limitations still. Pt will progress prn     Communication/Consultation:  None today  Equipment provided today:  None  Recommendations/Intent for next treatment session: Next visit will focus on BLE strengthening, BLE ROM and WB progressions/stamina training.    >Total Treatment Billable Duration:  38 minutes   Time In: 0930  Time Out: 1011     Cathlyn Searing, PT         Charge Capture  Events  MedBridge Portal  Appt Desk  Attendance Report     Future Appointments   Date Time Provider Department Center   04/23/2024  9:30 AM Searing Cathlyn, PT  [facility]   04/30/2024  9:30 AM Searing Cathlyn, PT [facility]   05/16/2024 12:50 PM PERIPHERAL [facility]   05/16/2024  2:00 PM Alexandra Domino, APRN - NP [facility]   09/20/2024  8:50 AM PST LAB PST [facility] DEP   09/30/2024  8:40 AM Ethyl Elspeth HERO, MD PST [facility] ECC DEP   11/22/2024  1:00 PM PERIPHERAL [facility]  11/22/2024  2:00 PM Mertha Garnette DEL, MD [facility]       "

## 2024-04-18 NOTE — Telephone Encounter (Signed)
"  Physician provider: Dr. Mertha  Reason for today's call (Please detail here patients chief complaint): PT returning call to r/s survivorship    Last office visit:na  Patient Callback Number: [phone]  Was callback number verified?: Yes  Preferred pharmacy (If refill request): na  Veriified that patient confirmed no refills left at pharmacy? :No  Has the patient called the office for this concern within the last 48 hours?:No    Red Word Mentioned  Warm Transfer Phone Call to (Name): na    Patient notified that their information will be routed to the appropriate team for review. Patient is advised that they will receive a phone call from the appropriate department. If awaiting a call from the triage department and symptoms worsen before receiving a call back, the patient has been advised to proceed to the nearest ED.        "

## 2024-04-23 ENCOUNTER — Inpatient Hospital Stay: Admit: 2024-04-23 | Payer: PRIVATE HEALTH INSURANCE | Primary: Family Medicine

## 2024-04-23 NOTE — Progress Notes (Signed)
 "Jill Kaufman  DOB: 1965-08-27  Primary: [employer] ([employer])  [place] [employer] [place] [facility]  [address]  [place] [address]  Phone: [phone]  Fax: [phone] Plan Frequency: 2x a week for up to 90 days (potentially 1x a week for up to 90 days for pt preference)  Plan of Care/Certification Expiration Date: 06/24/24        Plan of Care/Certification Expiration Date:  Plan of Care/Certification Expiration Date: 06/24/24    Frequency/Duration: Plan Frequency: 2x a week for up to 90 days (potentially 1x a week for up to 90 days for pt preference)      Time In/Out:   Time In: 0930  Time Out: 1012      PT Visit Info:    Progress Note Counter: 5      Visit Count:  13    OUTPATIENT PHYSICAL THERAPY:   Treatment Note 04/23/2024       Episode  (BLE foot pain )               Treatment Diagnosis:    Pain in both feet  Medical/Referring Diagnosis:    Pain of both heels    Referring Physician:  Ethyl Elspeth HERO, MD  MD Orders:  PT Eval and Treat   Return MD Appt:     Date of Onset:  No data recorded   Allergies:   Codeine  Restrictions/Precautions:   None      Interventions Planned (Treatment may consist of any combination of the following):     See Assessment Note    Subjective Comments:   Pt reports more good days than bad days, still having some discomfort with medial heel pain and medial arch pain at times.     Initial Pain Level: 2/10  Post Session Pain Level: 1/10    Medications Last Reviewed: 04/23/2024  Updated Objective Findings:  None Today  Treatment   THERAPEUTIC EXERCISE: (15  minutes):    Exercises per grid below to improve mobility, strength, balance and coordination.  Required visual, verbal, manual and tactile cues to promote proper body alignment, promote proper body posture, promote proper body mechanics and promote proper body breathing techniques.  Progressed resistance, range, repetitions and complexity of movement as  indicated.   Date:04/09/24   Date:04/16/24   Date: 04/23/24       Activity/Exercise      Pt ed on biomechanics, PT POC, HEP, outcomes, prognosis, BLE strengthening education, BLE ROM education, ankle/foot differential dx of DJD, plantar fascia tightness, talocrural joint dysfunction, posterior tibial tendon dysfunction, TA weakness dysfunction, PF weakness dysfunction, toe intrinsic weakness, Achilles tendinopathy, calcaneal heel spur education, fallen arch education, windlass mechanism dysfunction, stress fx, meta tarsal bone fx education, neurogenic pain syndrome, diabetes polyneuropathy education, red flag s/s of stress fx, soft tissue ligamentous ruptures        SCI fit, skilled trunk reciprocation, BLE strengthening, BLE ROM and CV endurance/stamina training.    10 mins  10 mins  10 mins    DF  stretch BLE         THERAPEUTIC ACTIVITY: (  minutes):    Therapeutic activities per grid below to improve mobility, strength, balance and coordination.Required visual, verbal, manual and tactile cues to promote motor control of bilateral, lower extremity(s).   Date:   Date:   Date:     Activity/Exercise Parameters Parameters Parameters  NEUROMUSCULAR RE-EDUCATION: (23 minutes):    Exercise/activities per grid below to improve balance, coordination, kinesthetic sense, posture and proprioception.  Required visual, verbal, manual and tactile cues to promote motor control of bilateral, lower extremity(s).   Date:04/09/24   Date:04/16/24   Date:04/23/24     Activity/Exercise      Towel crunches, cues for toe intrinsic activation, arch activation, medial arch control       Seated DF AROM, cues for TA activation  X 40 X 40 X 50   Seated PF AROM cues for arch control, toe intrinsic activation, gastroc/soleus activation  X 40    X 40 PF + INV PTT  X 40    X 40 PF + INV PTT  X 40    X 40 PF + INV PTT    Seated INV/PF, posterior tibial isolation, toe intrinsic activation  X 40 X 40 X 40   Prone PF/DF ART IASTM +  neuro-re education, strain/counter strain/contract-relax PNF 5 mins  5 mins  5 mins    Seated DF/Toe instrinsic/plantar fascia neuro re-education + IASTM plantar fascia  5 mins  5 mins  5 mins      MANUAL THERAPY: ( 5 minutes):   Joint mobilization, Soft tissue mobilization, Manipulation and Manual lymphatic drainage was utilized and necessary because of the patient's restricted joint motion, painful spasm, loss of articular motion and restricted motion of soft tissue.    Date:03/19/24   04/16/24     Activity/Exercise     Supine BLE Foot IASTM plantar fascia/prone LLE gastroc/soleus/PTT 5 mins + hot pack 5 mins + hot pack               MODALITIES: (10 minutes):         Therapy in order to provide analgesia, relieve muscle spasm and reduce inflammation and edema. Hot pack was placed over the plantar fascia and posterior aspect of LLE calf musculature. Pt skin was inspected pre and post modality no abnormalities seen.            Treatment/Session Summary:    Treatment Assessment:   Pt did perform GRF testing and resulted with LLE= supination/posterior tibial weakness (PF +INV) and RLE= flat feet/pronation/decreased DF AROM/PROM. Pt reports improvement with pain in the heel and ambulatory distance. Pt reports much greater improvement with pain and stiffness, pt reports that she can almost go the whole week with no pain. Pt is starting to show good signs of strength preservation but still gets talocrural limitations still. Pt did have more PTT restrictions but did show release at the end of today's session. Pt will progress prn     Communication/Consultation:  None today  Equipment provided today:  None  Recommendations/Intent for next treatment session: Next visit will focus on BLE strengthening, BLE ROM and WB progressions/stamina training.    >Total Treatment Billable Duration:  38 minutes   Time In: 0930  Time Out: 1012     Cathlyn Searing, PT         Charge Capture  Events  MedBridge Portal  Appt Desk  Attendance Report      Future Appointments   Date Time Provider Department Center   04/30/2024  9:30 AM Searing Cathlyn, PT [facility]   05/13/2024 11:45 AM Searing Cathlyn, PT [facility]   05/15/2024  2:15 PM Searing Cathlyn, PT [facility]   05/20/2024 10:15 AM Searing Cathlyn, PT [facility]   05/22/2024  9:40 AM [facility]   05/22/2024 10:30  AM Alexandra Domino, APRN - NP [facility]   05/23/2024 11:00 AM Annett Ny, PT [facility]   05/28/2024  9:30 AM Annett Ny, PT [facility]   05/30/2024  9:30 AM Annett Ny, PT [facility]   06/11/2024  9:30 AM Annett Ny, PT [facility]   09/20/2024  8:50 AM PST LAB PST [facility] DEP   09/30/2024  8:40 AM Ethyl Elspeth HERO, MD PST [facility] ECC DEP   11/22/2024  1:00 PM PERIPHERAL [facility]   11/22/2024  2:00 PM Mertha Garnette DEL, MD [facility]       "

## 2024-04-30 ENCOUNTER — Inpatient Hospital Stay: Admit: 2024-04-30 | Payer: PRIVATE HEALTH INSURANCE | Primary: Family Medicine

## 2024-04-30 NOTE — Progress Notes (Signed)
 "Jill Kaufman  DOB: March 29, 1966  Primary: [employer] ([employer])  [place]: [place] LOC[place] [facility]  [address]  [place] [address]  Phone: [phone]  Fax: [phone] Plan Frequency: 2x a week for up to 90 days (potentially 1x a week for up to 90 days for pt preference)  Plan of Care/Certification Expiration Date: 06/24/24        Plan of Care/Certification Expiration Date:  Plan of Care/Certification Expiration Date: 06/24/24    Frequency/Duration: Plan Frequency: 2x a week for up to 90 days (potentially 1x a week for up to 90 days for pt preference)      Time In/Out:   Time In: 0931  Time Out: 1013      PT Visit Info:    Progress Note Counter: 6      Visit Count:  14    OUTPATIENT PHYSICAL THERAPY:   Treatment Note 04/30/2024       Episode  (BLE foot pain )               Treatment Diagnosis:    Pain in both feet  Medical/Referring Diagnosis:    Pain of both heels    Referring Physician:  Ethyl Elspeth HERO, MD  MD Orders:  PT Eval and Treat   Return MD Appt:     Date of Onset:  No data recorded   Allergies:   Codeine  Restrictions/Precautions:   None      Interventions Planned (Treatment may consist of any combination of the following):     See Assessment Note    Subjective Comments:   Pt reports more good days than bad days, still having some discomfort with medial heel pain and medial arch pain at times.     Initial Pain Level: 2/10  Post Session Pain Level: 1/10    Medications Last Reviewed: 04/30/2024  Updated Objective Findings:  None Today  Treatment   THERAPEUTIC EXERCISE: (15  minutes):    Exercises per grid below to improve mobility, strength, balance and coordination.  Required visual, verbal, manual and tactile cues to promote proper body alignment, promote proper body posture, promote proper body mechanics and promote proper body breathing techniques.  Progressed resistance, range, repetitions and complexity of movement as  indicated.   Date:04/09/24   Date:04/16/24   Date: 04/23/24     Date:04/30/24     Activity/Exercise       Pt ed on biomechanics, PT POC, HEP, outcomes, prognosis, BLE strengthening education, BLE ROM education, ankle/foot differential dx of DJD, plantar fascia tightness, talocrural joint dysfunction, posterior tibial tendon dysfunction, TA weakness dysfunction, PF weakness dysfunction, toe intrinsic weakness, Achilles tendinopathy, calcaneal heel spur education, fallen arch education, windlass mechanism dysfunction, stress fx, meta tarsal bone fx education, neurogenic pain syndrome, diabetes polyneuropathy education, red flag s/s of stress fx, soft tissue ligamentous ruptures         SCI fit, skilled trunk reciprocation, BLE strengthening, BLE ROM and CV endurance/stamina training.    10 mins  10 mins  10 mins  10 mins    DF  stretch BLE          THERAPEUTIC ACTIVITY: (  minutes):    Therapeutic activities per grid below to improve mobility, strength, balance and coordination.Required visual, verbal, manual and tactile cues to promote motor control of bilateral, lower extremity(s).   Date:   Date:   Date:     Activity/Exercise Parameters Parameters Parameters  NEUROMUSCULAR RE-EDUCATION: (23 minutes):    Exercise/activities per grid below to improve balance, coordination, kinesthetic sense, posture and proprioception.  Required visual, verbal, manual and tactile cues to promote motor control of bilateral, lower extremity(s).   Date:04/23/24   Date:04/30/24     Activity/Exercise     Towel crunches, cues for toe intrinsic activation, arch activation, medial arch control      Seated DF AROM, cues for TA activation  X 50 X 50   Seated PF AROM cues for arch control, toe intrinsic activation, gastroc/soleus activation  X 40    X 40 PF + INV PTT  X 40    X 40 PF + INV PTT    Seated INV/PF, posterior tibial isolation, toe intrinsic activation  X 40 X 40   Prone PF/DF ART IASTM + neuro-re education,  strain/counter strain/contract-relax PNF 5 mins  5 mins    Seated DF/Toe instrinsic/plantar fascia neuro re-education + IASTM plantar fascia  5 mins  5 mins      MANUAL THERAPY: ( 5 minutes):   Joint mobilization, Soft tissue mobilization, Manipulation and Manual lymphatic drainage was utilized and necessary because of the patient's restricted joint motion, painful spasm, loss of articular motion and restricted motion of soft tissue.    Date:03/19/24   04/16/24     Activity/Exercise     Supine BLE Foot IASTM plantar fascia/prone LLE gastroc/soleus/PTT 5 mins + hot pack 5 mins + hot pack               MODALITIES: (10 minutes):         Therapy in order to provide analgesia, relieve muscle spasm and reduce inflammation and edema. Hot pack was placed over the plantar fascia and posterior aspect of LLE calf musculature. Pt skin was inspected pre and post modality no abnormalities seen.            Treatment/Session Summary:    Treatment Assessment:   Pt tolerated tx well, pt reports getting a full week of relief. Pt reports some pain on weekends and reports after activity use she gets pretty sore. Pt reports more functional mobility and less pain intensity each week now.  Pt will progress prn     Communication/Consultation:  None today  Equipment provided today:  None  Recommendations/Intent for next treatment session: Next visit will focus on BLE strengthening, BLE ROM and WB progressions/stamina training.    >Total Treatment Billable Duration:  38 minutes   Time In: 0931  Time Out: 1013     Cathlyn Searing, PT         Charge Capture  Events  MedBridge Portal  Appt Desk  Attendance Report     Future Appointments   Date Time Provider Department Center   05/13/2024 11:45 AM Searing Cathlyn, PT [facility]   05/15/2024  2:15 PM Searing Cathlyn, PT [facility]   05/20/2024 10:15 AM Karalee Senior, PTA [facility]   05/22/2024  9:40 AM [facility]   05/22/2024 10:30 AM Alexandra Domino, APRN - NP [facility]   05/23/2024  11:00 AM Searing Cathlyn, PT [facility]   05/28/2024  9:30 AM Searing Cathlyn, PT [facility]   05/30/2024  9:30 AM Searing Cathlyn, PT [facility]   06/11/2024  9:30 AM Searing Cathlyn, PT [facility] S[facility]   09/20/2024  8:50 AM PST LAB PST [facility] DE[facility]   09/30/2024  8:40 AM Ethyl Elspeth HERO, MD PST [facility] DEP   11/22/2024  1:00  PM [facility]   11/22/2024  2:00 PM Mertha Garnette DEL, MD [facility]       "

## 2024-05-13 ENCOUNTER — Inpatient Hospital Stay: Admit: 2024-05-13 | Payer: PRIVATE HEALTH INSURANCE | Primary: Family Medicine

## 2024-05-13 DIAGNOSIS — M79671 Pain in right foot: Principal | ICD-10-CM

## 2024-05-13 NOTE — Progress Notes (Signed)
 "Jill Kaufman  DOB: 07-19-1965  Primary: [employer] ([employer]  [place]: [employer] [place] [facility]  [address]  [place] [address]  Phone: [phone]  Fax: [phone] Plan Frequency: 2x a week for up to 90 days (potentially 1x a week for up to 90 days for pt preference)  Plan of Care/Certification Expiration Date: 06/24/24        Plan of Care/Certification Expiration Date:  Plan of Care/Certification Expiration Date: 06/24/24    Frequency/Duration: Plan Frequency: 2x a week for up to 90 days (potentially 1x a week for up to 90 days for pt preference)      Time In/Out:   Time In: 1145  Time Out: 1225      PT Visit Info:    Progress Note Counter: 7      Visit Count:  15    OUTPATIENT PHYSICAL THERAPY:   Treatment Note 05/13/2024       Episode  (BLE foot pain )               Treatment Diagnosis:    Pain in both feet  Medical/Referring Diagnosis:    Pain of both heels    Referring Physician:  Ethyl Elspeth HERO, MD  MD Orders:  PT Eval and Treat   Return MD Appt:     Date of Onset:  No data recorded   Allergies:   Codeine  Restrictions/Precautions:   None      Interventions Planned (Treatment may consist of any combination of the following):     See Assessment Note    Subjective Comments:   Pt reports more good days than bad days, still having some discomfort with medial heel pain and medial arch pain at times.     Initial Pain Level: 2/10  Post Session Pain Level: 1/10    Medications Last Reviewed: 05/13/2024  Updated Objective Findings:  None Today  Treatment   THERAPEUTIC EXERCISE: (15  minutes):    Exercises per grid below to improve mobility, strength, balance and coordination.  Required visual, verbal, manual and tactile cues to promote proper body alignment, promote proper body posture, promote proper body mechanics and promote proper body breathing techniques.  Progressed resistance, range, repetitions and complexity of movement as  indicated.   Date: 04/23/24     Date:04/30/24   Date:05/13/24     Activity/Exercise      Pt ed on biomechanics, PT POC, HEP, outcomes, prognosis, BLE strengthening education, BLE ROM education, ankle/foot differential dx of DJD, plantar fascia tightness, talocrural joint dysfunction, posterior tibial tendon dysfunction, TA weakness dysfunction, PF weakness dysfunction, toe intrinsic weakness, Achilles tendinopathy, calcaneal heel spur education, fallen arch education, windlass mechanism dysfunction, stress fx, meta tarsal bone fx education, neurogenic pain syndrome, diabetes polyneuropathy education, red flag s/s of stress fx, soft tissue ligamentous ruptures        SCI fit, skilled trunk reciprocation, BLE strengthening, BLE ROM and CV endurance/stamina training.    10 mins  10 mins  10 mins    DF  stretch BLE         THERAPEUTIC ACTIVITY: (  minutes):    Therapeutic activities per grid below to improve mobility, strength, balance and coordination.Required visual, verbal, manual and tactile cues to promote motor control of bilateral, lower extremity(s).   Date:   Date:   Date:     Activity/Exercise Parameters Parameters Parameters  NEUROMUSCULAR RE-EDUCATION: (23 minutes):    Exercise/activities per grid below to improve balance, coordination, kinesthetic sense, posture and proprioception.  Required visual, verbal, manual and tactile cues to promote motor control of bilateral, lower extremity(s).   Date:04/23/24   Date:04/30/24   Date:05/13/24     Activity/Exercise      Towel crunches, cues for toe intrinsic activation, arch activation, medial arch control       Seated DF AROM, cues for TA activation  X 50 X 50 X 50   Seated PF AROM cues for arch control, toe intrinsic activation, gastroc/soleus activation  X 40    X 40 PF + INV PTT  X 40    X 40 PF + INV PTT  X 40    X 40 PF + INV PTT    Seated INV/PF, posterior tibial isolation, toe intrinsic activation  X 40 X 40 X 40   Prone PF/DF ART IASTM +  neuro-re education, strain/counter strain/contract-relax PNF 5 mins  5 mins  5 mins    Seated DF/Toe instrinsic/plantar fascia neuro re-education + IASTM plantar fascia  5 mins  5 mins  5 mins      MANUAL THERAPY: ( 5 minutes):   Joint mobilization, Soft tissue mobilization, Manipulation and Manual lymphatic drainage was utilized and necessary because of the patient's restricted joint motion, painful spasm, loss of articular motion and restricted motion of soft tissue.    Date:03/19/24   04/16/24     Activity/Exercise     Supine BLE Foot IASTM plantar fascia/prone LLE gastroc/soleus/PTT 5 mins + hot pack 5 mins + hot pack               MODALITIES: (10 minutes):         Therapy in order to provide analgesia, relieve muscle spasm and reduce inflammation and edema. Hot pack was placed over the plantar fascia and posterior aspect of LLE calf musculature. Pt skin was inspected pre and post modality no abnormalities seen.            Treatment/Session Summary:    Treatment Assessment:   Pt tolerated tx well, pt reports getting a full week of relief. Pt reports some pain on weekends and reports after activity use she gets pretty sore. Pt reports more functional mobility and less pain intensity each week now. Pt reports now more focal LLE foot/heel discomfort at times with prolonged WB.  Pt will progress prn     Communication/Consultation:  None today  Equipment provided today:  None  Recommendations/Intent for next treatment session: Next visit will focus on BLE strengthening, BLE ROM and WB progressions/stamina training.    >Total Treatment Billable Duration:  38 minutes   Time In: 1145  Time Out: 1225     Cathlyn Searing, PT         Charge Capture  Events  MedBridge Portal  Appt Desk  Attendance Report     Future Appointments   Date Time Provider Department Center   05/15/2024  2:15 PM Searing Cathlyn, PT [facility]   05/20/2024 10:15 AM Karalee Senior, PTA [facility] S[facility]   05/22/2024  9:40 AM [facility]   05/22/2024  10:30 AM Alexandra Domino, APRN - NP [facility]   05/23/2024 11:00 AM Searing Cathlyn, PT [facility]   05/28/2024  9:30 AM Searing Cathlyn, PT [facility]   05/30/2024  9:30 AM Searing Cathlyn, PT [facility]   06/11/2024  9:30 AM Searing Cathlyn, PT [facility]  09/20/2024  8:50 AM PST LAB PST [facility] DE[facility]   09/30/2024  8:40 AM Ethyl Elspeth HERO, MD PST [facility] DEP   11/22/2024  1:00 PM [facility]   11/22/2024  2:00 PM Mertha Garnette DEL, MD [facility]       "

## 2024-05-15 ENCOUNTER — Inpatient Hospital Stay: Admit: 2024-05-15 | Payer: PRIVATE HEALTH INSURANCE | Primary: Family Medicine

## 2024-05-15 NOTE — Patient Instructions (Addendum)
"  Please call [phone] at your convenience to schedule MRI breast due May 2026 and Mammogram due late October 2026.       You can try tart cherry juice daily in the evening, also could consider adding a joint supplement (with chondroitin) for joint aches.    Referral placed to oncology rehab for recommendations planned to start in January.    Call or message with any concerns that arise prior to your next visit here.  Please let me know if your joint aches have not improved in about 1 month.        "

## 2024-05-15 NOTE — Progress Notes (Signed)
 "Jill Kaufman  DOB: Aug 31, 1965  Primary: [employer] ([employer]  [place] [employer] [place] [facility]  [address]  [place] [address]  Phone: [phone]  Fax: [phone] Plan Frequency: 2x a week for up to 90 days (potentially 1x a week for up to 90 days for pt preference)  Plan of Care/Certification Expiration Date: 06/24/24        Plan of Care/Certification Expiration Date:  Plan of Care/Certification Expiration Date: 06/24/24    Frequency/Duration: Plan Frequency: 2x a week for up to 90 days (potentially 1x a week for up to 90 days for pt preference)      Time In/Out:   Time In: 1415  Time Out: 1456      PT Visit Info:    Progress Note Counter: 8      Visit Count:  16    OUTPATIENT PHYSICAL THERAPY:   Treatment Note 05/15/2024       Episode  (BLE foot pain )               Treatment Diagnosis:    Pain in both feet  Medical/Referring Diagnosis:    Pain of both heels    Referring Physician:  Ethyl Elspeth HERO, MD  MD Orders:  PT Eval and Treat   Return MD Appt:     Date of Onset:  No data recorded   Allergies:   Codeine  Restrictions/Precautions:   None      Interventions Planned (Treatment may consist of any combination of the following):     See Assessment Note    Subjective Comments:   Pt reports more good days than bad days most of her orginial pain is gone but still gets heel pain still     Initial Pain Level: 2/10  Post Session Pain Level: 1/10    Medications Last Reviewed: 05/15/2024  Updated Objective Findings:  None Today  Treatment   THERAPEUTIC EXERCISE: (15  minutes):    Exercises per grid below to improve mobility, strength, balance and coordination.  Required visual, verbal, manual and tactile cues to promote proper body alignment, promote proper body posture, promote proper body mechanics and promote proper body breathing techniques.  Progressed resistance, range, repetitions and complexity of movement as indicated.    Date:04/30/24   Date:05/13/24   Date:05/15/24     Activity/Exercise      Pt ed on biomechanics, PT POC, HEP, outcomes, prognosis, BLE strengthening education, BLE ROM education, ankle/foot differential dx of DJD, plantar fascia tightness, talocrural joint dysfunction, posterior tibial tendon dysfunction, TA weakness dysfunction, PF weakness dysfunction, toe intrinsic weakness, Achilles tendinopathy, calcaneal heel spur education, fallen arch education, windlass mechanism dysfunction, stress fx, meta tarsal bone fx education, neurogenic pain syndrome, diabetes polyneuropathy education, red flag s/s of stress fx, soft tissue ligamentous ruptures        SCI fit, skilled trunk reciprocation, BLE strengthening, BLE ROM and CV endurance/stamina training.    10 mins  10 mins  10 mins    DF  stretch BLE         THERAPEUTIC ACTIVITY: (  minutes):    Therapeutic activities per grid below to improve mobility, strength, balance and coordination.Required visual, verbal, manual and tactile cues to promote motor control of bilateral, lower extremity(s).   Date:   Date:   Date:     Activity/Exercise Parameters Parameters Parameters  NEUROMUSCULAR RE-EDUCATION: (23 minutes):    Exercise/activities per grid below to improve balance, coordination, kinesthetic sense, posture and proprioception.  Required visual, verbal, manual and tactile cues to promote motor control of bilateral, lower extremity(s).   Date:04/23/24   Date:04/30/24   Date:05/13/24   Date:05/15/24     Activity/Exercise       Towel crunches, cues for toe intrinsic activation, arch activation, medial arch control        Seated DF AROM, cues for TA activation  X 50 X 50 X 50 X 50   Seated PF AROM cues for arch control, toe intrinsic activation, gastroc/soleus activation  X 40    X 40 PF + INV PTT  X 40    X 40 PF + INV PTT  X 40    X 40 PF + INV PTT  X 40    X 40 PF + INV PTT    Seated INV/PF, posterior tibial isolation, toe intrinsic activation  X 40 X  40 X 40 X 40   Prone PF/DF ART IASTM + neuro-re education, strain/counter strain/contract-relax PNF 5 mins  5 mins  5 mins  5 mins     PF+ INV contract relax, PTT emphasis    Seated DF/Toe instrinsic/plantar fascia neuro re-education + IASTM plantar fascia  5 mins  5 mins  5 mins  5 mins      MANUAL THERAPY: ( 5 minutes):   Joint mobilization, Soft tissue mobilization, Manipulation and Manual lymphatic drainage was utilized and necessary because of the patient's restricted joint motion, painful spasm, loss of articular motion and restricted motion of soft tissue.    Date:03/19/24   04/16/24     Activity/Exercise     Supine BLE Foot IASTM plantar fascia/prone LLE gastroc/soleus/PTT 5 mins + hot pack 5 mins + hot pack               MODALITIES: (10 minutes):         Therapy in order to provide analgesia, relieve muscle spasm and reduce inflammation and edema. Hot pack was placed over the plantar fascia and posterior aspect of LLE calf musculature. Pt skin was inspected pre and post modality no abnormalities seen.            Treatment/Session Summary:    Treatment Assessment:   Pt tolerated tx well, pt reports getting a full week of relief. Pt reports some pain on weekends and reports after activity use she gets pretty sore. Pt reports more functional mobility and less pain intensity each week now. Pt reports now more focal LLE foot/heel discomfort at times with prolonged WB. Pt is progressing nicely with gross ankle/foot function.  Pt will progress prn     Communication/Consultation:  None today  Equipment provided today:  None  Recommendations/Intent for next treatment session: Next visit will focus on BLE strengthening, BLE ROM and WB progressions/stamina training.    >Total Treatment Billable Duration:  38 minutes   Time In: 1415  Time Out: 1456     Cathlyn Searing, PT         Charge Capture  Events  MedBridge Portal  Appt Desk  Attendance Report     Future Appointments   Date Time Provider Department Center   05/20/2024  10:15 AM Karalee Senior, PTA [facility]   05/22/2024  9:40 AM [facility]   05/22/2024 10:30 AM Alexandra Domino, APRN - NP [facility]   05/23/2024 11:00 AM Searing Cathlyn, PT [facility]  05/28/2024  9:30 AM Annett Ny, PT [facility]   05/30/2024  9:30 AM Annett Ny, PT [facility]   06/11/2024  9:30 AM Annett Ny, PT [facility] S[facility]   09/20/2024  8:50 AM PST LAB PST [facility] DEP   09/30/2024  8:40 AM Ethyl Elspeth HERO, MD PST [facility] ECC DEP   11/22/2024  1:00 PM [facility]   11/22/2024  2:00 PM Mertha Garnette DEL, MD [facility]       "

## 2024-05-16 ENCOUNTER — Encounter: Payer: PRIVATE HEALTH INSURANCE | Attending: Family | Primary: Family Medicine

## 2024-05-16 ENCOUNTER — Encounter: Payer: PRIVATE HEALTH INSURANCE | Primary: Family Medicine

## 2024-05-17 ENCOUNTER — Other Ambulatory Visit: Payer: PRIVATE HEALTH INSURANCE | Primary: Family Medicine

## 2024-05-17 ENCOUNTER — Encounter: Payer: PRIVATE HEALTH INSURANCE | Attending: Family | Primary: Family Medicine

## 2024-05-20 ENCOUNTER — Inpatient Hospital Stay: Admit: 2024-05-20 | Payer: PRIVATE HEALTH INSURANCE | Primary: Family Medicine

## 2024-05-20 NOTE — Progress Notes (Signed)
 "Jill Kaufman  DOB: 1966/01/26  Primary: [employer]  Secondary: [employer] [place] LOCAL [facility]  [address]  [place] [address]  Phone: [phone]  Fax: [phone] Plan Frequency: 2x a week for up to 90 days (potentially 1x a week for up to 90 days for pt preference)  Plan of Care/Certification Expiration Date: 06/24/24        Plan of Care/Certification Expiration Date:  Plan of Care/Certification Expiration Date: 06/24/24    Frequency/Duration: Plan Frequency: 2x a week for up to 90 days (potentially 1x a week for up to 90 days for pt preference)      Time In/Out:   Time In: 1015  Time Out: 1100      PT Visit Info:    Progress Note Counter: 9      Visit Count:  17    OUTPATIENT PHYSICAL THERAPY:   Treatment Note 05/20/2024       Episode  (BLE foot pain )               Treatment Diagnosis:    Pain in both feet  Medical/Referring Diagnosis:    Pain of both heels    Referring Physician:  Ethyl Elspeth HERO, MD  MD Orders:  PT Eval and Treat   Return MD Appt:     Date of Onset:  No data recorded   Allergies:   Codeine  Restrictions/Precautions:   None      Interventions Planned (Treatment may consist of any combination of the following):     See Assessment Note    Subjective Comments:   Pt reports more good days than bad days most of her orginial pain is gone but still gets heel pain still     Initial Pain Level: 2/10  Post Session Pain Level: 1/10    Medications Last Reviewed: 05/20/2024  Updated Objective Findings:  None Today  Treatment   THERAPEUTIC EXERCISE: (15  minutes):    Exercises per grid below to improve mobility, strength, balance and coordination.  Required visual, verbal, manual and tactile cues to promote proper body alignment, promote proper body posture, promote proper body mechanics and promote proper body breathing techniques.  Progressed resistance, range, repetitions and complexity of movement as indicated.    Date:04/30/24   Date:05/13/24   Date:05/15/24     Activity/Exercise      Pt ed on biomechanics, PT POC, HEP, outcomes, prognosis, BLE strengthening education, BLE ROM education, ankle/foot differential dx of DJD, plantar fascia tightness, talocrural joint dysfunction, posterior tibial tendon dysfunction, TA weakness dysfunction, PF weakness dysfunction, toe intrinsic weakness, Achilles tendinopathy, calcaneal heel spur education, fallen arch education, windlass mechanism dysfunction, stress fx, meta tarsal bone fx education, neurogenic pain syndrome, diabetes polyneuropathy education, red flag s/s of stress fx, soft tissue ligamentous ruptures        SCI fit, skilled trunk reciprocation, BLE strengthening, BLE ROM and CV endurance/stamina training.    10 mins  10 mins  10 mins    DF  stretch BLE       LAQ    15 reps    Mini-squats    15 reps      THERAPEUTIC ACTIVITY: ( 15 minutes):    Therapeutic activities per grid below to improve mobility, strength, balance and coordination.Required visual, verbal, manual and tactile cues to promote motor control of bilateral, lower extremity(s).   Date:  12/8 Date:   Date:  Activity/Exercise Parameters Parameters Parameters   Gait  Added KT to knee to prevent heavy valgus. 4 x 6ft, 3100ft                    NEUROMUSCULAR RE-EDUCATION: (23 minutes):    Exercise/activities per grid below to improve balance, coordination, kinesthetic sense, posture and proprioception.  Required visual, verbal, manual and tactile cues to promote motor control of bilateral, lower extremity(s).   Date:04/23/24   Date:04/30/24   Date:05/13/24   Date:05/15/24     Activity/Exercise       Towel crunches, cues for toe intrinsic activation, arch activation, medial arch control        Seated DF AROM, cues for TA activation  X 50 X 50 X 50 X 50   Seated PF AROM cues for arch control, toe intrinsic activation, gastroc/soleus activation  X 40    X 40 PF + INV PTT  X 40    X 40 PF + INV PTT  X 40    X 40 PF +  INV PTT  X 40    X 40 PF + INV PTT    Seated INV/PF, posterior tibial isolation, toe intrinsic activation  X 40 X 40 X 40 X 40   Prone PF/DF ART IASTM + neuro-re education, strain/counter strain/contract-relax PNF 5 mins  5 mins  5 mins  5 mins     PF+ INV contract relax, PTT emphasis    Seated DF/Toe instrinsic/plantar fascia neuro re-education + IASTM plantar fascia  5 mins  5 mins  5 mins  5 mins             MANUAL THERAPY: ( 8 minutes):   Joint mobilization, Soft tissue mobilization, Manipulation and Manual lymphatic drainage was utilized and necessary because of the patient's restricted joint motion, painful spasm, loss of articular motion and restricted motion of soft tissue.    Date:03/19/24   04/16/24   05/20/24   Activity/Exercise      Supine BLE Foot IASTM plantar fascia/prone LLE gastroc/soleus/PTT 5 mins + hot pack 5 mins + hot pack    STM    To calf, foot and ankle B sides.            MODALITIES: ( minutes):         Therapy in order to provide analgesia, relieve muscle spasm and reduce inflammation and edema. Hot pack was placed over the plantar fascia and posterior aspect of LLE calf musculature. Pt skin was inspected pre and post modality no abnormalities seen.            Treatment/Session Summary:    Treatment Assessment:   Pt. Tolerate therapy well today, will see how she feels next visit and advance as appropriate.     Communication/Consultation:  None today  Equipment provided today:  None  Recommendations/Intent for next treatment session: Next visit will focus on BLE strengthening, BLE ROM and WB progressions/stamina training.    >Total Treatment Billable Duration:  42 minutes   Time In: 1015  Time Out: 1100     Garnette Lent, PTA         Charge Capture  Events  [employer] Portal  Appt Desk  Attendance Report     Future Appointments   Date Time Provider Department Center   05/22/2024  9:40 AM [facility]   05/22/2024 10:30 AM Alexandra Domino, APRN - NP [facility] AMB   05/23/2024  11:00 AM Annett Ny, PT [facility]  05/28/2024  9:30 AM Annett Ny, PT [facility]   05/30/2024  9:30 AM Annett Ny, PT [facility]   06/11/2024  9:30 AM Annett Ny, PT [facility]   09/20/2024  8:50 AM PST LAB [facility] DEP   09/30/2024  8:40 AM Ethyl Elspeth HERO, MD PST [facility] DEP   11/22/2024  1:00 PM [facility]   11/22/2024  2:00 PM Mertha Garnette DEL, MD [facility] AM[facility]       "

## 2024-05-21 ENCOUNTER — Encounter

## 2024-05-21 ENCOUNTER — Encounter: Payer: Self-pay | Admitting: Obstetrics & Gynecology

## 2024-05-21 ENCOUNTER — Ambulatory Visit: Admitting: Obstetrics & Gynecology

## 2024-05-21 VITALS — BP 117/79 | HR 60 | Ht 66.0 in | Wt 286.3 lb

## 2024-05-21 DIAGNOSIS — Z9889 Other specified postprocedural states: Secondary | ICD-10-CM

## 2024-05-21 DIAGNOSIS — R87612 Low grade squamous intraepithelial lesion on cytologic smear of cervix (LGSIL): Secondary | ICD-10-CM

## 2024-05-21 NOTE — Progress Notes (Signed)
    GYNECOLOGY PROGRESS NOTE  Subjective:    Patient ID: Caitlin Butler, female    DOB: 01/17/1966, 58 y.o.   MRN: [id number]  HPI  Patient is a 58 y.o. here for a post op visit. She had PMB and a polyp seen on ultasound. She had D&C and hysteroscopy with negative pathology.She has no complaints, reports normal bowel and bladder function.  The following portions of the patient's history were reviewed and updated as appropriate: allergies, current medications, past family history, past medical history, past social history, past surgical history, and problem list.  Review of Systems Pertinent items are noted in HPI.  She had a LGSIL pap 12/2023 and then a colpo 01/2024 with negative pathology.  Objective:   Blood pressure (!) 153/114, pulse 78, height 5' 6 (1.676 m), weight 286 lb 4.8 oz (129.9 kg), last menstrual period 05/15/2013. Body mass index is 46.21 kg/m. Repeat BP was 117/86 Well nourished, well hydrated Black female, no apparent distress She is ambulating and conversing normally.   Assessment:   1. Post-operative state   2. LGSIL on Pap smear of cervix      Plan:   1. Post-operative state (Primary) - doing well  2. LGSIL on Pap smear of cervix - normal colpo and ECC 01/2024 - will need repeat pap 02/2025

## 2024-05-22 ENCOUNTER — Inpatient Hospital Stay: Admit: 2024-05-22 | Payer: PRIVATE HEALTH INSURANCE | Primary: Family Medicine

## 2024-05-22 ENCOUNTER — Ambulatory Visit
Admit: 2024-05-22 | Discharge: 2024-05-22 | Payer: PRIVATE HEALTH INSURANCE | Attending: Family | Primary: Family Medicine

## 2024-05-22 VITALS — BP 119/83 | HR 87 | Temp 98.40000°F | Resp 16 | Ht 62.99 in | Wt 149.6 lb

## 2024-05-22 DIAGNOSIS — C50111 Malignant neoplasm of central portion of right female breast: Secondary | ICD-10-CM

## 2024-05-22 DIAGNOSIS — Z170 Estrogen receptor positive status [ER+]: Principal | ICD-10-CM

## 2024-05-22 LAB — COMPREHENSIVE METABOLIC PANEL
ALT: 23 U/L (ref 8–45)
AST: 22 U/L (ref 15–37)
Albumin/Globulin Ratio: 1.2 (ref 1.0–1.9)
Albumin: 4 g/dL (ref 3.5–5.0)
Alk Phosphatase: 104 U/L (ref 35–104)
Anion Gap: 12 mmol/L (ref 7–16)
BUN: 17 mg/dL (ref 6–23)
CO2: 25 mmol/L (ref 20–29)
Calcium: 9.6 mg/dL (ref 8.8–10.2)
Chloride: 102 mmol/L (ref 98–107)
Creatinine: 0.86 mg/dL (ref 0.60–1.10)
Est, Glom Filt Rate: 78 ml/min/1.73m2 (ref >60)
Globulin: 3.3 g/dL (ref 2.3–3.5)
Glucose: 120 mg/dL — ABNORMAL HIGH (ref 70–99)
Potassium: 4.1 mmol/L (ref 3.5–5.1)
Sodium: 139 mmol/L (ref 136–145)
Total Bilirubin: 0.5 mg/dL (ref 0.0–1.2)
Total Protein: 7.4 g/dL (ref 6.3–8.2)

## 2024-05-22 LAB — CBC WITH AUTO DIFFERENTIAL
Basophils %: 0.8 % (ref 0.0–2.0)
Basophils Absolute: 0.05 K/UL (ref 0.00–0.20)
Eosinophils %: 2.1 % (ref 0.5–7.8)
Eosinophils Absolute: 0.13 K/UL (ref 0.00–0.80)
Hematocrit: 40.4 % (ref 35.8–46.3)
Hemoglobin: 13.8 g/dL (ref 11.7–15.4)
Immature Granulocytes %: 0.3 % (ref 0.0–5.0)
Immature Granulocytes Absolute: 0.02 K/UL (ref 0.00–0.50)
Lymphocytes %: 23.8 % (ref 13.0–44.0)
Lymphocytes Absolute: 1.5 K/UL (ref 0.50–4.60)
MCH: 29.8 pg (ref 26.1–32.9)
MCHC: 34.2 g/dL (ref 31.4–35.0)
MCV: 87.3 FL (ref 82.0–102.0)
MPV: 10.1 FL (ref 9.4–12.3)
Monocytes %: 4.8 % (ref 4.0–12.0)
Monocytes Absolute: 0.3 K/UL (ref 0.10–1.30)
Neutrophils %: 68.2 % (ref 43.0–78.0)
Neutrophils Absolute: 4.29 K/UL (ref 1.70–8.20)
Platelets: 264 K/uL (ref 150–450)
RBC: 4.63 M/uL (ref 4.05–5.2)
RDW: 12.6 % (ref 11.9–14.6)
WBC: 6.3 K/uL (ref 4.3–11.1)
nRBC: 0 K/uL (ref 0.0–0.2)

## 2024-05-22 LAB — FOLLICLE STIMULATING HORMONE: FSH: 56.6 m[IU]/mL

## 2024-05-22 LAB — ESTRADIOL: Estradiol: <5 pg/mL

## 2024-05-22 LAB — LUTEINIZING HORMONE: LH: 18.6 m[IU]/mL

## 2024-05-22 NOTE — Progress Notes (Signed)
 Research Scientist (medical) Hematology and Oncology: Survivorship Clinic New Patient    Diagnosis:  Hx of Breast Cancer    Medical Oncologist:  Dr. Mertha    Radiation Oncologist:  Dr. Brien     Surgeon:  Dr. Dail    Cancer Treatment Summary  - Date of Diagnosis: 03/13/2018  - Location and Type of Cancer: Right breast invasive ductal carcinoma, grade 2, ER 95%, PR 95%, HER2 negative  - Treatment Received: Lumpectomy 05/04/2018; XRT; 5 years of Tamoxifen  completed 2024, Arimidex  started December 2024     PCP:  Dr. Elspeth Angle     Chief Complaint:  Chief Complaint   Patient presents with    Follow-up     History of Present Illness:  (portions copied from prior notes from Dr. Mertha)  [name] is a 58 y.o. female who presents today for evaluation regarding hx of breast cancer.  She underwent routine mammogram in September 2019 which showed an irregular mass density in the upper outer right breast.  She underwent ultrasound and biopsy 03/13/2018 which showed invasive ductal carcinoma, grade 2, ER 95%, PR 95%, HER2 negative.  MRI showed the lesion to  be 1.5 cm with no other abnormalities.  She was referred to Dr. Millican and underwent lumpectomy and sentinel node biopsy on 05/04/18 which showed 1.6 cm of tumor with negative margins and 1 sentinel lymph node negative for tumor.  Oncotype Dx testing  was low risk at 6; therefore, chemotherapy was safely deferred.  She completed radiation.  She has a hx of hysterectomy.  She was initially started on Tamoxifen  d/t borderline FSH/LH/E2 levels (completed 5 years).  She was then transitioned to Arimidex  December of 2024 with plan for 5 years of treatment (completing around December 2029).          Interval History:  She is here today for follow up and to establish with Survivorship program.  She is on Arimidex  and has been for about 1 year.  She does note that she has joint aches, described as being present in every joint in her body including fingers.  She has not tried anything for joint  pain but does not some interference in her QOL, limiting her activities.  She also has had foot pain, which is unrelated, but she has been undergoing PT with improvement.  PT is planned to complete around the end of this year.  She otherwise feels good.  She has good energy during the day and sleeps well.  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  Her mom unfortunately fractured her hip previously, and now lives with her and her husband.  This is going well so far, and her mom is able to be at home by herself and perform ADLs.  She denies any new breast pain, no other new or worrisome pain.  She does note eczema which occurs occasionally on her breasts.  She has seen dermatologist and felt c/w eczema.  She uses Aquaphor which helps.  Prior to having joint and feet pain, she was active with walking.  She has not been able to work out recently but is interested in exercise recommendations.  She denies any issues with sleep.  She denies any mental health struggles, no anxiety or depression has been noted.  Discussed available support services such as oncology rehab, nutrition counseling, and mental health counseling.                  Review of Systems:  Review of Systems   Constitutional:  Negative for appetite change, fatigue and fever.   HENT:   Negative for hearing loss.    Respiratory:  Negative for shortness of breath.    Cardiovascular:  Negative for chest pain.   Gastrointestinal:  Negative for constipation, diarrhea, nausea and vomiting.   Endocrine: Negative for hot flashes.   Genitourinary:  Negative for difficulty urinating.    Musculoskeletal:  Positive for arthralgias.   Skin:  Negative for itching.   Psychiatric/Behavioral:  Negative for sleep disturbance. The patient is not nervous/anxious.        Allergies   Allergen Reactions    Codeine Nausea And Vomiting and Dizziness or Vertigo     Past Medical History:   Diagnosis Date    Breast cancer (HCC)     T1cN0 right breast cancer     GERD (gastroesophageal reflux disease)     no medication at this time (noted 07/30/21)    High cholesterol     History of therapeutic radiation     Nausea & vomiting     PONV (postoperative nausea and vomiting)     Thyroid disease 06/14/2011    Hypothyroidism      Past Surgical History:   Procedure Laterality Date    BREAST BIOPSY Right 05/04/2018    RIGHT BREAST NEEDLE LOCALIZED BIOPSY performed by Dail Fairy SAUNDERS, MD at [facility] MAIN OR    BREAST LUMPECTOMY Right 05/04/2018    RIGHT BREAST LUMPECTOMY performed by Dail Fairy SAUNDERS, MD at [facility] MAIN OR    COLONOSCOPY N/A 08/09/2021    COLORECTAL CANCER SCREENING, NOT HIGH RISK performed by Randalyn Romans, MD at [facility] ENDOSCOPY    GYN      hysterectomy partial-still has ovaries    HYSTERECTOMY (CERVIX STATUS UNKNOWN)      US  PLACE BREAST LOC DEVICE 1ST LESION RIGHT Right 05/04/2018    US  GUIDED NEEDLE LOC OF RIGHT BREAST 05/04/2018 [facility] M[facility]MO     Family History   Problem Relation Age of Onset    Heart Failure Mother     Heart Attack Mother         late 32's, currently in her 68's    Cancer Father         throat cancer, skin cancer     Diabetes Father     Heart Attack Father         44's, now late 38's    Breast Cancer Neg Hx      Social History     Socioeconomic History    Marital status: Married     Spouse name: Not on file    Number of children: Not on file    Years of education: Not on file    Highest education level: Not on file   Occupational History    Not on file   Tobacco Use    Smoking status: Never     Passive exposure: Never    Smokeless tobacco: Never   Vaping Use    Vaping status: Never Used   Substance and Sexual Activity    Alcohol use: Yes     Comment: ocassionally    Drug use: Not Currently    Sexual activity: Not on file   Other Topics Concern    Not on file   Social History Narrative    Not on file     Social Drivers of Health     Financial Resource Strain: Patient Declined (09/01/2021)  Overall Financial Resource Strain (CARDIA)     Difficulty  of Paying Living Expenses: Patient declined   Food Insecurity: No Food Insecurity (09/29/2023)    Hunger Vital Sign     Worried About Running Out of Food in the Last Year: Never true     Ran Out of Food in the Last Year: Never true   Transportation Needs: No Transportation Needs (09/29/2023)    PRAPARE - Therapist, Art (Medical): No     Lack of Transportation (Non-Medical): No   Physical Activity: Not on file   Stress: Not on file (04/16/2023)   Social Connections: Not on file   Intimate Partner Violence: Low Risk (09/22/2019)    Received from [facility]    Intimate Partner Violence     Insults You: Not on file     Threatens You: Not on file     Screams at You: Not on file     Physically Hurt: Not on file     Intimate Partner Violence Score: Not on file   Housing Stability: Low Risk  (09/29/2023)    Housing Stability Vital Sign     Unable to Pay for Housing in the Last Year: No     Number of Times Moved in the Last Year: 0     Homeless in the Last Year: No     Current Outpatient Medications   Medication Sig Dispense Refill    lovastatin  (MEVACOR ) 10 MG tablet Take 1 tablet by mouth daily 100 tablet 3    levothyroxine  (SYNTHROID ) 75 MCG tablet Take 1 tablet by mouth every morning (before breakfast) 90 tablet 1    anastrozole  (ARIMIDEX ) 1 MG tablet Take 1 tablet by mouth daily 90 tablet 3    Ivermectin 1 % CREA daily       No current facility-administered medications for this visit.       OBJECTIVE:  Vitals:    05/22/24 1008   BP: 119/83   Pulse: 87   Resp: 16   Temp: 98.4 F (36.9 C)   SpO2: 98%   Weight: 67.9 kg (149 lb 9.6 oz)   Height: 1.6 m (5' 2.99)       Pain Score: Zero (fatigue-0)      ECOG PERFORMANCE STATUS: - 0-Fully active, able to carry on all pre-disease performance without restriction.    PHQ/PH9 Score:        05/22/2024    10:13 AM 04/05/2024     8:37 AM 11/17/2023     3:11 PM   PHQ-9    Little interest or pleasure in doing things 0 0 0   Feeling down, depressed, or hopeless  0 0 0   PHQ-2 Score 0 0 0   PHQ-9 Total Score 0 0 0       Neuropathy:  N/A pt did not receive chemotherapy     Nutrition:  Working on healthy eating       Physical Exam:  Patient alert and oriented x 3, in no acute distress  Integumentary: No Pallor, no icterus  Extremities: No peripheral edema, no joint swelling  Neurological: patient can follow commands and move all extremities    Labs:  Hospital Outpatient Visit on 05/22/2024   Component Date Value Ref Range Status    WBC 05/22/2024 6.3  4.3 - 11.1 K/uL Final    RBC 05/22/2024 4.63  4.05 - 5.2 M/uL Final    Hemoglobin 05/22/2024 13.8  11.7 - 15.4 g/dL  Final    Hematocrit 05/22/2024 40.4  35.8 - 46.3 % Final    MCV 05/22/2024 87.3  82.0 - 102.0 FL Final    MCH 05/22/2024 29.8  26.1 - 32.9 PG Final    MCHC 05/22/2024 34.2  31.4 - 35.0 g/dL Final    RDW 87/89/7974 12.6  11.9 - 14.6 % Final    Platelets 05/22/2024 264  150 - 450 K/uL Final    MPV 05/22/2024 10.1  9.4 - 12.3 FL Final    nRBC 05/22/2024 0.00  0.0 - 0.2 K/uL Final    **Note: Absolute NRBC parameter is now reported with Hemogram**    Neutrophils % 05/22/2024 68.2  43.0 - 78.0 % Final    Lymphocytes % 05/22/2024 23.8  13.0 - 44.0 % Final    Monocytes % 05/22/2024 4.8  4.0 - 12.0 % Final    Eosinophils % 05/22/2024 2.1  0.5 - 7.8 % Final    Basophils % 05/22/2024 0.8  0.0 - 2.0 % Final    Immature Granulocytes % 05/22/2024 0.3  0.0 - 5.0 % Final    Neutrophils Absolute 05/22/2024 4.29  1.70 - 8.20 K/UL Final    Lymphocytes Absolute 05/22/2024 1.50  0.50 - 4.60 K/UL Final    Monocytes Absolute 05/22/2024 0.30  0.10 - 1.30 K/UL Final    Eosinophils Absolute 05/22/2024 0.13  0.00 - 0.80 K/UL Final    Basophils Absolute 05/22/2024 0.05  0.00 - 0.20 K/UL Final    Immature Granulocytes Absolute 05/22/2024 0.02  0.00 - 0.50 K/UL Final    Differential Type 05/22/2024 AUTOMATED    Final    Sodium 05/22/2024 139  136 - 145 mmol/L Final    Potassium 05/22/2024 4.1  3.5 - 5.1 mmol/L Final    Chloride 05/22/2024 102   98 - 107 mmol/L Final    CO2 05/22/2024 25  20 - 29 mmol/L Final    Anion Gap 05/22/2024 12  7 - 16 mmol/L Final    Glucose 05/22/2024 120 (H)  70 - 99 mg/dL Final    Comment: <29 mg/dL Consistent with, but not fully diagnostic of hypoglycemia.  100 - 125 mg/dL Impaired fasting glucose/consistent with pre-diabetes mellitus.  > 126 mg/dl Fasting glucose consistent with overt diabetes mellitus      BUN 05/22/2024 17  6 - 23 MG/DL Final    Creatinine 87/89/7974 0.86  0.60 - 1.10 MG/DL Final    Est, Glom Filt Rate 05/22/2024 78  >60 ml/min/1.1m2 Final    Comment:    Pediatric calculator link: https://www.kidney.org/professionals/kdoqi/gfr_calculatorped     These results are not intended for use in patients <39 years of age.     eGFR results are calculated without a race factor using  the 2021 CKD-EPI equation. Careful clinical correlation is recommended, particularly when comparing to results calculated using previous equations.  The CKD-EPI equation is less accurate in patients with extremes of muscle mass, extra-renal metabolism of creatinine, excessive creatine ingestion, or following therapy that affects renal tubular secretion.      Calcium 05/22/2024 9.6  8.8 - 10.2 MG/DL Final    Total Bilirubin 05/22/2024 0.5  0.0 - 1.2 MG/DL Final    ALT 87/89/7974 23  8 - 45 U/L Final    AST 05/22/2024 22  15 - 37 U/L Final    Alk Phosphatase 05/22/2024 104  35 - 104 U/L Final    Total Protein 05/22/2024 7.4  6.3 - 8.2 g/dL Final    Albumin 87/89/7974 4.0  3.5 - 5.0 g/dL Final    Globulin 87/89/7974 3.3  2.3 - 3.5 g/dL Final    Albumin/Globulin Ratio 05/22/2024 1.2  1.0 - 1.9   Final       Imaging:  Mammogram Result (most recent):  MAM TOMO DIGITAL SCREEN BILATERAL 04/03/2024    Narrative  STUDY:  BILATERAL DIGITAL MAMMOGRAM AND TOMOSYNTHESIS    INDICATION:  Screening    ADDITIONAL HISTORY: Right lumpectomy in 2019.  Tyrer-Cuzick model not applicable in a patient with a personal history of breast  malignancy    COMPARISON:   Prior studies dating back to October 2020    TECHNIQUE: Bilateral digital mammography was performed, and is interpreted in  conjunction with a computer assisted detection (CAD) system.  Bilateral breast  tomography was also performed.    BREAST DENSITY: There are scattered areas of fibroglandular density.  (#BDB)    FINDINGS: No dominant masses, suspicious calcifications or areas of distortion.    Impression  Stable mammogram without suspicious findings.    RECOMMENDATION:  Screening mammogram in one year.    BI-RADS Assessment Category 1: Negative. (#BRad1)    For high-risk patients, the [employer] of [employer] recommends considering  additional screening with breast MRI.  For patients with heterogeneously dense or extremely dense breast tissue and/or  considered intermediate-risk, continue/consider annual 3D tomosynthesis  mammography.    Annual mammograms are recommended for all women over the age of 6.   A reminder  letter will be sent to the patient.    Electronically signed by Debby LITTIE Beagle    Performing Facility: [facility] [address], [address] [place], [place]  [address] Phone: [phone]      DEXA Result (most recent):  DEXA BONE DENSITY AXIAL SKELETON 10/10/2023    Narrative  STUDY: DUAL ENERGY X-RAY ABSORPTIOMETRY / DEXA    REASON FOR EXAM: Female, 58 years old. Postmenopausal    TECHNIQUE: Bone Mineral Density (BMD) measurements of lumbar spine and bilateral  hips were obtained.    COMPARISON: 11/18/2020    FINDINGS:  Lumbar Spine:  Bone mineral density: (L1-L4): 1.014 g/cm2  T-score : -1.1  Z-score:   -0.1    Left Femoral Neck:  Bone mineral density: 0.910 g/cm2  T-score: -0.9  Z-score: 0.2    Right Femoral Neck:  Bone mineral density: 0.97 g/cm2  T-score : -1.0  Z-score : 0.1    Total hip left:  Bone mineral density: 0.911 g/cm2  T-score: -0.8  Z-score: 0.0    Total hip right:  Bone mineral density: [0.908] g/cm2  T-score: -0.8  Z-score:  0.0    Total Mean Femur:  Bone mineral density: [0.910] g/cm2  T-score: -0.8  Z-score: 0.0    Impression  Using the World Health Organization criteria the bone mineral density is:  OSTEOPENIA  When compared with the previous examination, there has been decrease in bone  mineral density within the lumbar spine by approximately: 7%    When compared to the previous examination, there has been decrease in bone  mineral density within the total mean hips by approximately : 4%    FRAX:  World Health Organization meta-analysis fracture risk calculator (FRAX) analysis  was performed for 10 year fracture risk probability assessment.    10 year probability of major osteoporotic fracture: 13.3%  10 year probability of hip fracture: 0.4%        The National Osteoporosis Foundation recommends that medical therapy be  considered in postmenopausal  women and men age 8 years and older with a:  a. Hip or vertebral fracture,  b. T-score <= -2.5 in the spine or hip (osteoporotic), or  c. T-score between -1.0 and -2.5 (low bone mass) and FRAX => 3% for hip fracture  or => 20% for major osteoporotic fracture.    Electronically signed by Alois FORBES Puff    MRI Result (most recent):  MRI BREAST BILATERAL W WO CONTRAST 10/30/2023    Narrative  MRI of the Breasts    INDICATION: History of right breast lumpectomy/radiation therapy for malignancy    TECHNIQUE:  Standard MRI sequences were obtained through the breasts in multiple  planes. Images were obtained before and after intravenous infusion of 14 ml of  ProHance .    COMPARISON: 10/28/2022, 10/27/2021    FINDINGS:  Tissue density:  Heterogeneously dense.    Background enhancement:  Minimal.    Right breast: Post lumpectomy changes/radiation changes of right breast. Similar  appearance of skin lesion compared to priors. No definite suspicious abnormal  enhancement of right breast    Left breast: No definite suspicious abnormal enhancement of left breast    No definite enlarged axillary or  internal mammary adenopathy.    Partially visible cholelithiasis.    Impression  Post lumpectomy changes of right breast. No suspicious MRI findings of bilateral  breast.    RECOMMENDATION:  Routine screening mammography with supplemental screening utilizing breast MRI      BI-RADS Assessment Category 2: Benign finding. (#BRad2)          Electronically signed by Lonni Armour    ASSESSMENT:   Diagnosis Orders   1. Malignant neoplasm of central portion of right breast in female, estrogen receptor positive (HCC)  MAM TOMO DIGITAL SCREEN BILATERAL      2. Encounter for monitoring aromatase inhibitor therapy        3. Breast cancer screening by mammogram  MAM TOMO DIGITAL SCREEN BILATERAL      4. Screening for breast cancer using non-mammogram modality        5. Dense breast tissue        6. Arthralgia, unspecified joint  [facility] - Oncology Rehab          PLAN:  Lab studies and imaging studies were personally reviewed.    Hx of Breast Cancer, s/p lumpectomy/XRT, 5 years of Tamoxifen , On Arimidex   Monitoring for s/sxs of recurrence   C/w Arimidex  which will complete 5 years around December 2029   Mammogram / MRI's alternating (dense breast tissue is noted)    Joint Aches  Can try tart cherry juice and/or joint supplement with chondroitin  If joint aches not improved with these, can consider rotating to a different AI such as Letrozole     Osteopenia  DEXA scan due May 2027     Recommended Testing and Screenings:    Routine health screening/maintenance as per PCP   Mammogram due late October 2026  MRI breast due May of 2026    Referrals made:    Referral to onc rehab for exercise recommendations   Pt is aware of other support services that are available should she desire referrals in the future     Survivorship care plan reviewed with patient today and printed copy provided to patient today.      Recommendations discussed regarding strategies to prevent cancer recurrence including maintaining a healthy weight, regular  physical activity (approximately 30 minutes per day, or 150 minutes/week), healthy diet focusing on fruits,  vegetables, lean protein and whole grains, tobacco avoidance and limiting alcohol intake (moderation), sun safety (using sunscreen appropriately and avoiding tanning beds), adequate sleep/stress management, maintaining regular follow up with healthcare providers, and regular surveillance/screenings.      Counseled patient on monitoring for late and long term side effects related to treatment and signs and symptoms to report to oncology provider.      35 minutes of face to face time with patient for today's visit.  Additional time spent reviewing records, charting and preparing care plan.      Will follow up in:  6 months with Dr. Mertha and in about 1 year with me.            Lauraine Igo, FNP-C  Survivorship NP  [facility]  [address]  [place], [place] [address]  Office : [phone]  Fax : [phone]     "

## 2024-05-23 ENCOUNTER — Inpatient Hospital Stay: Admit: 2024-05-23 | Payer: PRIVATE HEALTH INSURANCE | Primary: Family Medicine

## 2024-05-23 NOTE — Progress Notes (Signed)
 "Jill Kaufman  DOB: 27-Nov-1965  Primary: [employer] ([employer])  [place]: [employer] [place] LOC[place] [facility]  [address]   [place] [address]  Phone: [phone]  Fax: [phone] Plan Frequency: 2x a week for up to 90 days (potentially 1x a week for up to 90 days for pt preference)  Plan of Care/Certification Expiration Date: 06/24/24        Plan of Care/Certification Expiration Date:  Plan of Care/Certification Expiration Date: 06/24/24    Frequency/Duration: Plan Frequency: 2x a week for up to 90 days (potentially 1x a week for up to 90 days for pt preference)      Time In/Out:   Time In: 1100  Time Out: 1143      PT Visit Info:    Progress Note Counter: 10      Visit Count:  18    OUTPATIENT PHYSICAL THERAPY:   Treatment Note 05/23/2024       Episode  (BLE foot pain )               Treatment Diagnosis:    Pain in both feet  Medical/Referring Diagnosis:    Pain of both heels    Referring Physician:  Ethyl Elspeth HERO, MD  MD Orders:  PT Eval and Treat   Return MD Appt:     Date of Onset:  No data recorded   Allergies:   Codeine  Restrictions/Precautions:   None      Interventions Planned (Treatment may consist of any combination of the following):     See Assessment Note    Subjective Comments:   Pt reports more good days than bad days most of her orginial pain is gone but still gets heel pain still     Initial Pain Level: 2/10  Post Session Pain Level: 1/10    Medications Last Reviewed: 05/23/2024  Updated Objective Findings:  None Today  Treatment   THERAPEUTIC EXERCISE: (10 minutes):    Exercises per grid below to improve mobility, strength, balance and coordination.  Required visual, verbal, manual and tactile cues to promote proper body alignment, promote proper body posture, promote proper body mechanics and promote proper body breathing techniques.  Progressed resistance, range, repetitions and complexity of movement as indicated.    Date:05/15/24   Date:05/23/24     Activity/Exercise     Pt ed on biomechanics, PT POC, HEP, outcomes, prognosis, BLE strengthening education, BLE ROM education, ankle/foot differential dx of DJD, plantar fascia tightness, talocrural joint dysfunction, posterior tibial tendon dysfunction, TA weakness dysfunction, PF weakness dysfunction, toe intrinsic weakness, Achilles tendinopathy, calcaneal heel spur education, fallen arch education, windlass mechanism dysfunction, stress fx, meta tarsal bone fx education, neurogenic pain syndrome, diabetes polyneuropathy education, red flag s/s of stress fx, soft tissue ligamentous ruptures       SCI fit, skilled trunk reciprocation, BLE strengthening, BLE ROM and CV endurance/stamina training.    10 mins  10 mins    DF  stretch BLE      LAQ  15 reps     Mini-squats  15 reps       THERAPEUTIC ACTIVITY: ( minutes):    Therapeutic activities per grid below to improve mobility, strength, balance and coordination.Required visual, verbal, manual and tactile cues to promote motor control of bilateral, lower extremity(s).   Date:  12/8 Date:   Date:     Activity/Exercise Parameters Parameters Parameters   Gait  Added KT to knee  to prevent heavy valgus. 4 x 5ft, 355ft                    NEUROMUSCULAR RE-EDUCATION: (25 minutes):    Exercise/activities per grid below to improve balance, coordination, kinesthetic sense, posture and proprioception.  Required visual, verbal, manual and tactile cues to promote motor control of bilateral, lower extremity(s).   Date:05/13/24   Date:05/15/24   Date:05/23/24     Activity/Exercise      Towel crunches, cues for toe intrinsic activation, arch activation, medial arch control       Seated DF AROM, cues for TA activation  X 50 X 50 X 50   Seated PF AROM cues for arch control, toe intrinsic activation, gastroc/soleus activation  X 40    X 40 PF + INV PTT  X 40    X 40 PF + INV PTT  X 50    X 40 PF + INV PTT    Seated INV/PF, posterior tibial isolation, toe  intrinsic activation  X 40 X 40 X 40   Prone PF/DF ART IASTM + neuro-re education, strain/counter strain/contract-relax PNF 5 mins  5 mins     PF+ INV contract relax, PTT emphasis  5 mins     PF+ INV contract relax, PTT emphasis    Seated DF/Toe instrinsic/plantar fascia neuro re-education + IASTM plantar fascia  5 mins  5 mins  5 mins            MANUAL THERAPY: (5  minutes):   Joint mobilization, Soft tissue mobilization, Manipulation and Manual lymphatic drainage was utilized and necessary because of the patient's restricted joint motion, painful spasm, loss of articular motion and restricted motion of soft tissue.    Date:03/19/24   04/16/24   05/20/24 Date:05/23/24     Activity/Exercise       Supine BLE Foot IASTM plantar fascia/prone LLE gastroc/soleus/PTT 5 mins + hot pack 5 mins + hot pack  5 mins + hot pack   STM    To calf, foot and ankle B sides.              MODALITIES: ( minutes):         Therapy in order to provide analgesia, relieve muscle spasm and reduce inflammation and edema. Hot pack was placed over the plantar fascia and posterior aspect of LLE calf musculature. Pt skin was inspected pre and post modality no abnormalities seen.            Treatment/Session Summary:    Treatment Assessment:   Pt tolerated tx well, pt does present with still some stiffness/adhesions in the posterior compartment of LLE. Pt reports some overall improvement with pain in the heel and ankle ROM but still reports some intermittent pain at times. Will progress prn      Communication/Consultation:  None today  Equipment provided today:  None  Recommendations/Intent for next treatment session: Next visit will focus on BLE strengthening, BLE ROM and WB progressions/stamina training.    >Total Treatment Billable Duration:  40 minutes   Time In: 1100  Time Out: 1143     Cathlyn Searing, PT         Charge Capture  Events  MedBridge Portal  Appt Desk  Attendance Report     Future Appointments   Date Time Provider Department Center    05/28/2024  9:30 AM Searing Cathlyn, PT [facility] SFD   05/30/2024  9:30 AM Searing Cathlyn, PT [facility]   06/11/2024  9:30 AM Annett Ny, PT [facility]   06/20/2024  9:00 AM Jana Vernell Slocumb, PT [facility] SFO   09/20/2024  8:50 AM PST LAB PST [facility] DEP   09/30/2024  8:40 AM Ethyl Elspeth HERO, MD PST [facility] ECC DEP   11/22/2024  1:00 PM PERIPHERAL [facility]   11/22/2024  2:00 PM Mertha Garnette DEL, MD [facility] AMB   05/22/2025  7:40 AM PERIPHERAL [facility] GCC   05/22/2025  8:30 AM Alexandra Domino, APRN - NP [facility] AMB       "

## 2024-05-28 ENCOUNTER — Inpatient Hospital Stay: Admit: 2024-05-28 | Payer: PRIVATE HEALTH INSURANCE | Primary: Family Medicine

## 2024-05-28 NOTE — Plan of Care (Signed)
 "     Jill Kaufman  DOB: [birth year]  Primary: [employer] ([employer])  [place]: [place] [place] [facility]  [address]  [place] [address]  Phone: [phone]  Fax: [phone] Plan Frequency: 2x a week for up to 90 days (potentially 1x a week for up to 90 days for pt preference)    Plan of Care/Certification Expiration Date: 06/24/24        Plan of Care/Certification Expiration Date:  Plan of Care/Certification Expiration Date: 06/24/24    Frequency/Duration: Plan Frequency: 2x a week for up to 90 days (potentially 1x a week for up to 90 days for pt preference)      Time In/Out:   Time In: 0930  Time Out: 1014      PT Visit Info:    Progress Note Counter: 9      Visit Count:  19                OUTPATIENT PHYSICAL THERAPY:             Progress Report 05/28/2024               Episode (BLE foot pain )         Treatment Diagnosis:     Pain in both feet  Medical/Referring Diagnosis:    Pain of both heels    Referring Physician:  Ethyl Elspeth HERO, MD    MD Orders:  PT Eval and Treat   Return MD Appt:    Date of Onset:      Allergies:  Codeine  Restrictions/Precautions:    None      Medications Last Reviewed: 05/28/2024     SUBJECTIVE   History of Injury/Illness (Reason for Referral):  Pt arrives today with BLE foot/ankle pain in both heels. Pt reports that her LLE foot pain > RLE foot pain. Pt reports that sedentary behavior causes her to have a lot of heel pain. Pt reports that walking prolonged distances also gives her a decent pain. Pt reports that her pain is in the posterior-medial aspect of the LLE>RLE. Pt reports some medial plantar fascia pain that does get exacerbated with prolonged WB.  Pt reports that her ADLs are minimally affected, she can perform most home duties without issues however does feel pain. Pt denies any numbness and tingling. Pt denies swelling. Pt denies any MOI or trauma that is related to this initial incident.      Patient Stated Goal(s):  would like to move w/o BLE foot pain    Initial Pain Level:  2/10   Post Session Pain Level: 2/10    Past Medical History/Comorbidities:   Jill Kaufman  has a past medical history of Breast cancer (HCC), GERD (gastroesophageal reflux disease), High cholesterol, History of therapeutic radiation, Nausea & vomiting, PONV (postoperative nausea and vomiting), and Thyroid disease.  Jill Kaufman  has a past surgical history that includes gyn; Breast lumpectomy (Right, 05/04/2018); Breast biopsy (Right, 05/04/2018); US  PLACE BREAST LOC DEVICE 1ST LESION RIGHT (Right, 05/04/2018); Hysterectomy; and Colonoscopy (N/A, 08/09/2021).  Social History/Living Environment:   Patient lives with their family    Prior Level of Function/Work/Activity:   Prior Level of Function: Independent      Learning:   Does the patient/guardian have any barriers to learning?: No barriers  Will there be a co-learner?: No  What is the preferred language of the patient/guardian?: English  Is an interpreter required?:  No  How does the patient/guardian prefer to learn new concepts?: Listening; Demonstration    Fall Risk Scale:   Morse Total Score: 0       OBJECTIVE     NATURE OF CONDITION Date: 12/28/23 Date: 03/26/24   Date:05/28/24     Highest level of pain 7 5 3    Lowest level of pain 2 1 1    Aggravating factors Activity use/WB  Activity use/WB  Activity use/WB    Alleviating factors rest rest rest   Frequency of symptoms Everyday  Intermittent  Intermittent    Description of symptoms Soreness/ache Soreness/ache Soreness/ache   Location of tenderness Plantar aspect BLE heels Plantar aspect BLE heels Plantar aspect BLE heels     RANGE OF MOTION Date: 12/28/23 Date:  03/26/24 Date:05/28/24       RIGHT LEFT RIGHT LEFT RIGHT LEFT   Ankle Eversion  15 21 20  35     Ankle Inversion 25 15 40 35     Ankle Dorsiflexion 15 11 16 16      Ankle Plantarflexion WNL WNL WNL WNL WNL WNL     Strength Date: 12/28/23 Date:  03/26/24 Date:05/28/24        RIGHT LEFT RIGHT LEFT RIGHT LEFT   Ankle Eversion  3- 3- 3+ 3+ 4- 4-   Ankle Inversion 3- 3- 3+ 3+ 4- 4-   Ankle Dorsiflexion 3- 3- 3+ 3+ 4- 4-   Ankle Plantarflexion 3- 3- 3+ 3+ 4- 4-        NEUROLOGICAL SCREEN  Dermatomes: WNL  Reflexes: WNL   Outcome Measure:   Tool Used: FOOT AND ANKLE ABILITY MEASURE  Score:  Initial: 57 Most Recent: 95 (Date: 03/26/24 ) Most Recent: 95 (date:05/28/24)   Interpretation of Score: For the Activities of Daily Living, there are 21 questions each scored on a 5 point scale with 0 representing Unable to do and 4 representing No difficulty.  The lower the score, the greater the functional disability. 84/84 represents no disability.  Minimal detectable change is 5.7 points.  With the addition of the 8 questions in the Sports Subscale, there are 29 questions, each scored on a 5 point scale with 0 representing Unable to do and 4 representing No difficulty.  The lower the score, the greater the functional disability. 116/116 represents no disability.  Minimal detectable change is 12.3 points.    ASSESSMENT   Initial Assessment:  Pt is a 58 yo female who presents to the OPPT clinic with BLE foot/ankle pain. Pt has received Dx imaging via xray which revealed, Calcaneal spur and minimal degenerative change in the first toe MTP joint. Otherwise unremarkable foot radiographs. 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  Pt would benefit from skilled PT in order to address her impairments.     Pt has reached her first progress visit, Pt has attended 10 PT visits while completing 5/8 DC goals, with the remaining goals in progress towards completion. Pt is being seen for BLE foot/ankle pain. Pt has made progress with BLE strength, BLE WB stamina, and improved pain intensity. Pt still reports intermittent pain at times, intermittent stiffness and pain at times when taking her first steps with WB.  Pt would benefit to continue with skilled PT in order to address her remaining impairments. Pt;s PT POC has been extended to, 06/24/24    Pt has reached her second progress visit, Pt has attended 18 PT visits while  completing 6/8 DC goals, with the remaining goals in progress towards completion. Pt is being seen for BLE foot/ankle pain. Pt has made progress with pain, ROM, increased WB tolerance and improved ambulatory distances. Pt still reports pain at times in the LLE medial heel with prolonged WB, stiffness at times and some discomfort with prolonged standing. Pt would benefit to continue with skilled PT in order to address her remaining impairments.        Therapy Problem List: (Impacting functional limitations):    Increased Pain, Decreased Strength, Decreased ROM, Decreased Functional Mobility, Decreased Independence with Home Exercise Program, Decreased Posture, Decreased Body Mechanics, and Decreased Activity Tolerance/Endurance*   Therapy Prognosis:   Good     Initial Assessment Complexity:   Moderate Complexity       PLAN   Effective Dates: 03/26/24 TO Plan of Care/Certification Expiration Date: 06/24/24     Frequency/Duration: Plan Frequency: 2x a week for up to 90 days (potentially 1x a week for up to 90 days for pt preference)      Interventions Planned (Treatment may consist of any combination of the following):    Home Exercise Program (HEP), Manual Therapy, Neuromuscular Re-education/Strengthening, Pain Management, Positioning, Range of Motion (ROM), Therapeutic Activites, and Therapeutic Exercise/Strengthening   Goals: (Goals have been discussed and agreed upon with patient.)  Short-Term Functional Goals: Time Frame: 4 weeks   Pt will achieve a 65/116 FAAM score in order to improve ADLs and function MET  Pt will be compliant with HEP MET  Pt will achieve a VAS pain score of 5/10 in order to improve ADLS and function MET  Pt will improve all BLE Strength to 4-/5 in order to improve ADLs and function  MET  Discharge Goals: Time Frame: 8 weeks   Pt will achieve a 75/116 FAAM score in order to improve ADLs and function MET  Pt will be independent with HEP MET  Pt will achieve a VAS pain score of 0/10 in order to improve ADLS and function in progress  Pt will improve all BLE Strength to 5/5 in order to improve ADLs and function in progress         Medical Necessity:   > Skilled intervention continues to be required due to Skilled Physical Therapy services are needed for the pt's deficits above that affect the pt to perform ADLs, participate within their community, and to perform functional movement patterns.    Reason For Services/Other Comments:  > Patient continues to require skilled intervention due to Skilled Physical Therapy services are needed for the pt's deficits above that affect the pt to perform ADLs, participate within their community, and to perform functional movement patterns.      Regarding Jill Kaufman's therapy, I certify that the treatment plan above will be carried out by a therapist or under their direction.  Thank you for this referral,  Cathlyn Searing, PT     Referring Physician Signature: Ethyl Elspeth HERO, MD No Signature is Required for this note.        Charge Capture  Events  Appt Desk  Attendance Report        "

## 2024-05-28 NOTE — Progress Notes (Signed)
 "Jill Kaufman  DOB: 1966-05-24  Primary: [employer] ([employer])  [place]: [employer] [place] [facility]  [address]  [place] [address]  Phone: [phone]  Fax: [phone] Plan Frequency: 2x a week for up to 90 days (potentially 1x a week for up to 90 days for pt preference)  Plan of Care/Certification Expiration Date: 06/24/24        Plan of Care/Certification Expiration Date:  Plan of Care/Certification Expiration Date: 06/24/24    Frequency/Duration: Plan Frequency: 2x a week for up to 90 days (potentially 1x a week for up to 90 days for pt preference)      Time In/Out:   Time In: 0930  Time Out: 1014      PT Visit Info:    Progress Note Counter: 1      Visit Count:  19    OUTPATIENT PHYSICAL THERAPY:   Treatment Note 05/28/2024       Episode  (BLE foot pain )               Treatment Diagnosis:    Pain in both feet  Medical/Referring Diagnosis:    Pain of both heels    Referring Physician:  Ethyl Elspeth HERO, MD  MD Orders:  PT Eval and Treat   Return MD Appt:     Date of Onset:  No data recorded   Allergies:   Codeine  Restrictions/Precautions:   None      Interventions Planned (Treatment may consist of any combination of the following):     See Assessment Note    Subjective Comments:   Pt reports more good days than bad days most of her orginial pain is gone but still gets heel pain still     Initial Pain Level: 2/10  Post Session Pain Level: 1/10    Medications Last Reviewed: 05/28/2024  Updated Objective Findings:  None Today  Treatment   THERAPEUTIC EXERCISE: (10 minutes):    Exercises per grid below to improve mobility, strength, balance and coordination.  Required visual, verbal, manual and tactile cues to promote proper body alignment, promote proper body posture, promote proper body mechanics and promote proper body breathing techniques.  Progressed resistance, range, repetitions and complexity of movement as indicated.    Date:05/15/24   Date:05/23/24   Date:05/28/24     Activity/Exercise      Pt ed on biomechanics, PT POC, HEP, outcomes, prognosis, BLE strengthening education, BLE ROM education, ankle/foot differential dx of DJD, plantar fascia tightness, talocrural joint dysfunction, posterior tibial tendon dysfunction, TA weakness dysfunction, PF weakness dysfunction, toe intrinsic weakness, Achilles tendinopathy, calcaneal heel spur education, fallen arch education, windlass mechanism dysfunction, stress fx, meta tarsal bone fx education, neurogenic pain syndrome, diabetes polyneuropathy education, red flag s/s of stress fx, soft tissue ligamentous ruptures        SCI fit, skilled trunk reciprocation, BLE strengthening, BLE ROM and CV endurance/stamina training.    10 mins  10 mins  10 mins    DF  stretch BLE       LAQ  15 reps      Mini-squats  15 reps        THERAPEUTIC ACTIVITY: ( minutes):    Therapeutic activities per grid below to improve mobility, strength, balance and coordination.Required visual, verbal, manual and tactile cues to promote motor control of bilateral, lower extremity(s).   Date:  12/8 Date:   Date:     Activity/Exercise  Parameters Parameters Parameters   Gait  Added KT to knee to prevent heavy valgus. 4 x 22ft, 354ft                    NEUROMUSCULAR RE-EDUCATION: (25 minutes):    Exercise/activities per grid below to improve balance, coordination, kinesthetic sense, posture and proprioception.  Required visual, verbal, manual and tactile cues to promote motor control of bilateral, lower extremity(s).   Date:05/15/24   Date:05/23/24   Date:05/28/24     Activity/Exercise      Towel crunches, cues for toe intrinsic activation, arch activation, medial arch control       Seated DF AROM, cues for TA activation  X 50 X 50 X 50   Seated PF AROM cues for arch control, toe intrinsic activation, gastroc/soleus activation  X 40    X 40 PF + INV PTT  X 50    X 40 PF + INV PTT  X 50    X 40 PF + INV PTT    Seated INV/PF,  posterior tibial isolation, toe intrinsic activation  X 40 X 40 X 40   Prone PF/DF ART IASTM + neuro-re education, strain/counter strain/contract-relax PNF 5 mins     PF+ INV contract relax, PTT emphasis  5 mins     PF+ INV contract relax, PTT emphasis  5 mins     PF+ INV contract relax, PTT emphasis    Seated DF/Toe instrinsic/plantar fascia neuro re-education + IASTM plantar fascia  5 mins  5 mins  5 mins            MANUAL THERAPY: (5  minutes):   Joint mobilization, Soft tissue mobilization, Manipulation and Manual lymphatic drainage was utilized and necessary because of the patient's restricted joint motion, painful spasm, loss of articular motion and restricted motion of soft tissue.    05/20/24 Date:05/23/24   Date:05/28/24     Activity/Exercise      Supine BLE Foot IASTM plantar fascia/prone LLE gastroc/soleus/PTT  5 mins + hot pack 2 mins + hot pack   STM  To calf, foot and ankle B sides.      Seated long axis distraction LLE ankle/foot grades 2-3     Seated rearfoot clearance- bias the calcaneal INV and EV plantar glides grades 2-3    3 mins      MODALITIES: ( minutes):         Therapy in order to provide analgesia, relieve muscle spasm and reduce inflammation and edema. Hot pack was placed over the plantar fascia and posterior aspect of LLE calf musculature. Pt skin was inspected pre and post modality no abnormalities seen.            Treatment/Session Summary:    Treatment Assessment:   See progress note      Communication/Consultation:  None today  Equipment provided today:  None  Recommendations/Intent for next treatment session: Next visit will focus on BLE strengthening, BLE ROM and WB progressions/stamina training.    >Total Treatment Billable Duration:  40 minutes   Time In: 0930  Time Out: 1014     Cathlyn Searing, PT         Charge Capture  Events  MedBridge Portal  Appt Desk  Attendance Report     Future Appointments   Date Time Provider Department Center   05/30/2024  9:30 AM Searing Cathlyn, PT [facility]  SFD   06/11/2024  9:30 AM Searing Cathlyn, PT [facility]   06/20/2024  9:00 AM Jana Vernell Slocumb, PT [facility] SFO   09/20/2024  8:50 AM PST LAB PST [facility] DEP   09/30/2024  8:40 AM Ethyl Elspeth HERO, MD PST [facility] ECC DEP   11/22/2024  1:00 PM PERIPHERAL [facility]   11/22/2024  2:00 PM Mertha Garnette DEL, MD [facility] AMB   05/22/2025  7:40 AM [facility]   05/22/2025  8:30 AM Alexandra Domino, APRN - NP [facility] AMB       "

## 2024-05-30 ENCOUNTER — Inpatient Hospital Stay: Admit: 2024-05-30 | Payer: PRIVATE HEALTH INSURANCE | Primary: Family Medicine

## 2024-05-30 NOTE — Progress Notes (Signed)
 "Jill Kaufman  DOB: [birth year]  Primary: [employer] ([employer])  Secondary: [employer] [place] LOC[place] [facility]  [address]  [place] [address]  Phone: [phone]  Fax: [phone] Plan Frequency: 2x a week for up to 90 days (potentially 1x a week for up to 90 days for pt preference)  Plan of Care/Certification Expiration Date: 06/24/24        Plan of Care/Certification Expiration Date:  Plan of Care/Certification Expiration Date: 06/24/24    Frequency/Duration: Plan Frequency: 2x a week for up to 90 days (potentially 1x a week for up to 90 days for pt preference)      Time In/Out:   Time In: 0930  Time Out: 1011      PT Visit Info:    Progress Note Counter: 2      Visit Count:  20    OUTPATIENT PHYSICAL THERAPY:   Treatment Note 05/30/2024       Episode  (BLE foot pain )               Treatment Diagnosis:    Pain in both feet  Medical/Referring Diagnosis:    Pain of both heels    Referring Physician:  Ethyl Elspeth HERO, MD  MD Orders:  PT Eval and Treat   Return MD Appt:     Date of Onset:  No data recorded   Allergies:   Codeine  Restrictions/Precautions:   None      Interventions Planned (Treatment may consist of any combination of the following):     See Assessment Note    Subjective Comments:   Pt reports more good days than bad days most of her orginial pain is gone but still gets heel pain still     Initial Pain Level: 2/10  Post Session Pain Level: 1/10    Medications Last Reviewed: 05/30/2024  Updated Objective Findings:  None Today  Treatment   THERAPEUTIC EXERCISE: (10 minutes):    Exercises per grid below to improve mobility, strength, balance and coordination.  Required visual, verbal, manual and tactile cues to promote proper body alignment, promote proper body posture, promote proper body mechanics and promote proper body breathing techniques.  Progressed resistance, range, repetitions and complexity of movement as indicated.    Date:05/15/24   Date:05/23/24   Date:05/28/24   Date:05/30/24     Activity/Exercise       Pt ed on biomechanics, PT POC, HEP, outcomes, prognosis, BLE strengthening education, BLE ROM education, ankle/foot differential dx of DJD, plantar fascia tightness, talocrural joint dysfunction, posterior tibial tendon dysfunction, TA weakness dysfunction, PF weakness dysfunction, toe intrinsic weakness, Achilles tendinopathy, calcaneal heel spur education, fallen arch education, windlass mechanism dysfunction, stress fx, meta tarsal bone fx education, neurogenic pain syndrome, diabetes polyneuropathy education, red flag s/s of stress fx, soft tissue ligamentous ruptures         SCI fit, skilled trunk reciprocation, BLE strengthening, BLE ROM and CV endurance/stamina training.    10 mins  10 mins  10 mins  10 mins    DF  stretch BLE        LAQ  15 reps       Mini-squats  15 reps         THERAPEUTIC ACTIVITY: ( minutes):    Therapeutic activities per grid below to improve mobility, strength, balance and coordination.Required visual, verbal, manual and tactile cues to promote motor control of bilateral, lower extremity(s).   Date:  12/8 Date:   Date:     Activity/Exercise Parameters Parameters Parameters   Gait  Added KT to knee to prevent heavy valgus. 4 x 74ft, 321ft                    NEUROMUSCULAR RE-EDUCATION: (25 minutes):    Exercise/activities per grid below to improve balance, coordination, kinesthetic sense, posture and proprioception.  Required visual, verbal, manual and tactile cues to promote motor control of bilateral, lower extremity(s).   Date:05/15/24   Date:05/23/24   Date:05/28/24   Date:05/30/24     Activity/Exercise       Towel crunches, cues for toe intrinsic activation, arch activation, medial arch control        Seated DF AROM, cues for TA activation  X 50 X 50 X 50 X 50   Seated PF AROM cues for arch control, toe intrinsic activation, gastroc/soleus activation  X 40    X 40 PF + INV PTT  X 50    X 40 PF +  INV PTT  X 50    X 40 PF + INV PTT  X 50    X 50 PF + INV PTT    Seated INV/PF, posterior tibial isolation, toe intrinsic activation  X 40 X 40 X 40 X 50   Prone PF/DF ART IASTM + neuro-re education, strain/counter strain/contract-relax PNF 5 mins     PF+ INV contract relax, PTT emphasis  5 mins     PF+ INV contract relax, PTT emphasis  5 mins     PF+ INV contract relax, PTT emphasis  5 mins     PF+ INV contract relax, PTT emphasis    Seated DF/Toe instrinsic/plantar fascia neuro re-education + IASTM plantar fascia  5 mins  5 mins  5 mins  5 mins             MANUAL THERAPY: (5  minutes):   Joint mobilization, Soft tissue mobilization, Manipulation and Manual lymphatic drainage was utilized and necessary because of the patient's restricted joint motion, painful spasm, loss of articular motion and restricted motion of soft tissue.    05/20/24 Date:05/23/24   Date:05/28/24   Date: 05/30/24       Activity/Exercise       Supine BLE Foot IASTM plantar fascia/prone LLE gastroc/soleus/PTT  5 mins + hot pack 2 mins + hot pack 2 mins + hot pack   STM  To calf, foot and ankle B sides.       Seated long axis distraction LLE ankle/foot grades 2-3     Seated rearfoot clearance- bias the calcaneal INV and EV plantar glides grades 2-3    3 mins  3 mins      MODALITIES: ( minutes):         Therapy in order to provide analgesia, relieve muscle spasm and reduce inflammation and edema. Hot pack was placed over the plantar fascia and posterior aspect of LLE calf musculature. Pt skin was inspected pre and post modality no abnormalities seen.            Treatment/Session Summary:    Treatment Assessment:   Pt tolerated tx well, pt looks to have good ROM restoration and good rear foot clearance now. Pt reports less pain and less stiffness now. Will progress prn     Communication/Consultation:  None today  Equipment provided today:  None  Recommendations/Intent for next treatment session: Next visit will focus on BLE strengthening, BLE ROM  and WB  progressions/stamina training.    >Total Treatment Billable Duration:  40 minutes   Time In: 0930  Time Out: 1011     Cathlyn Searing, PT         Charge Capture  Events  MedBridge Portal  Appt Desk  Attendance Report     Future Appointments   Date Time Provider Department Center   06/11/2024  9:30 AM Searing Cathlyn, PT [facility] [place]   06/20/2024  9:00 AM Jana Vernell Slocumb, PT [facility]   09/20/2024  8:50 AM PST LAB PST [facility] E[facility] DEP   09/30/2024  8:40 AM Ethyl Elspeth HERO, MD PST [facility] ECC DEP   11/22/2024  1:00 PM [facility]HER[facility]   11/22/2024  2:00 PM Mertha Garnette DEL, MD [facility] AMB   05/22/2025  7:40 AM PER[facility]HERAL [facility]   05/22/2025  8:30 AM Alexandra Domino, APRN - NP [facility] AM[facility]       "

## 2024-05-31 ENCOUNTER — Other Ambulatory Visit: Payer: Self-pay

## 2024-05-31 ENCOUNTER — Other Ambulatory Visit: Payer: Self-pay | Admitting: Internal Medicine

## 2024-05-31 DIAGNOSIS — F411 Generalized anxiety disorder: Secondary | ICD-10-CM

## 2024-05-31 MED ORDER — ESCITALOPRAM OXALATE 10 MG PO TABS: 10.0000 mg | ORAL_TABLET | Freq: Every day | ORAL | 2 refills | Status: AC

## 2024-06-11 ENCOUNTER — Inpatient Hospital Stay: Admit: 2024-06-11 | Payer: PRIVATE HEALTH INSURANCE | Primary: Family Medicine

## 2024-06-11 NOTE — Progress Notes (Signed)
 "Jill Kaufman  DOB: 16-Jun-1965  Primary: [employer] ([employer])  Secondary: [employer] [place] [facility]  [address]  [place] [address]  Phone: [phone]  Fax: [phone] Plan Frequency: 2x a week for up to 90 days (potentially 1x a week for up to 90 days for pt preference)  Plan of Care/Certification Expiration Date: 06/24/24        Plan of Care/Certification Expiration Date:  Plan of Care/Certification Expiration Date: 06/24/24    Frequency/Duration: Plan Frequency: 2x a week for up to 90 days (potentially 1x a week for up to 90 days for pt preference)      Time In/Out:   Time In: 0930  Time Out: 1011      PT Visit Info:    Progress Note Counter: 3      Visit Count:  21    OUTPATIENT PHYSICAL THERAPY:   Treatment Note 06/11/2024       Episode  (BLE foot pain )               Treatment Diagnosis:    Pain in both feet  Medical/Referring Diagnosis:    Pain of both heels    Referring Physician:  Ethyl Elspeth HERO, MD  MD Orders:  PT Eval and Treat   Return MD Appt:     Date of Onset:  No data recorded   Allergies:   Codeine  Restrictions/Precautions:   None      Interventions Planned (Treatment may consist of any combination of the following):     See Assessment Note    Subjective Comments:   Pt reports more good days than bad days most of her orginial pain is gone but still gets heel pain still     Initial Pain Level: 2/10  Post Session Pain Level: 1/10    Medications Last Reviewed: 06/11/2024  Updated Objective Findings:  None Today  Treatment   THERAPEUTIC EXERCISE: (10 minutes):    Exercises per grid below to improve mobility, strength, balance and coordination.  Required visual, verbal, manual and tactile cues to promote proper body alignment, promote proper body posture, promote proper body mechanics and promote proper body breathing techniques.  Progressed resistance, range, repetitions and complexity of movement as indicated.    Date:05/28/24   Date:05/30/24   Date:06/11/24     Activity/Exercise      Pt ed on biomechanics, PT POC, HEP, outcomes, prognosis, BLE strengthening education, BLE ROM education, ankle/foot differential dx of DJD, plantar fascia tightness, talocrural joint dysfunction, posterior tibial tendon dysfunction, TA weakness dysfunction, PF weakness dysfunction, toe intrinsic weakness, Achilles tendinopathy, calcaneal heel spur education, fallen arch education, windlass mechanism dysfunction, stress fx, meta tarsal bone fx education, neurogenic pain syndrome, diabetes polyneuropathy education, red flag s/s of stress fx, soft tissue ligamentous ruptures        SCI fit, skilled trunk reciprocation, BLE strengthening, BLE ROM and CV endurance/stamina training.    10 mins  10 mins  10 mins   DF  stretch BLE       LAQ       Mini-squats         THERAPEUTIC ACTIVITY: ( minutes):    Therapeutic activities per grid below to improve mobility, strength, balance and coordination.Required visual, verbal, manual and tactile cues to promote motor control of bilateral, lower extremity(s).   Date:  12/8 Date:   Date:     Activity/Exercise Parameters Parameters Parameters  Gait  Added KT to knee to prevent heavy valgus. 4 x 49ft, 356ft                    NEUROMUSCULAR RE-EDUCATION: (25 minutes):    Exercise/activities per grid below to improve balance, coordination, kinesthetic sense, posture and proprioception.  Required visual, verbal, manual and tactile cues to promote motor control of bilateral, lower extremity(s).   Date:05/28/24   Date:05/30/24   Date:06/11/24   Activity/Exercise      Towel crunches, cues for toe intrinsic activation, arch activation, medial arch control       Seated DF AROM, cues for TA activation  X 50 X 50 X 50   Seated PF AROM cues for arch control, toe intrinsic activation, gastroc/soleus activation  X 50    X 40 PF + INV PTT  X 50    X 50 PF + INV PTT  X 50    X 50 PF + INV PTT    Seated INV/PF, posterior tibial  isolation, toe intrinsic activation  X 40 X 50 X 50    5 min eccentric isometric holds   Prone PF/DF ART IASTM + neuro-re education, strain/counter strain/contract-relax PNF 5 mins     PF+ INV contract relax, PTT emphasis  5 mins     PF+ INV contract relax, PTT emphasis  5 mins     PF+ INV contract relax, PTT emphasis    Seated DF/Toe instrinsic/plantar fascia neuro re-education + IASTM plantar fascia  5 mins  5 mins  5 mins            MANUAL THERAPY: (5  minutes):   Joint mobilization, Soft tissue mobilization, Manipulation and Manual lymphatic drainage was utilized and necessary because of the patient's restricted joint motion, painful spasm, loss of articular motion and restricted motion of soft tissue.    Date:05/28/24   Date: 05/30/24     Date:06/11/24     Activity/Exercise      Supine BLE Foot IASTM plantar fascia/prone LLE gastroc/soleus/PTT 2 mins + hot pack 2 mins + hot pack 2 mins + hot pack   STM       Seated long axis distraction LLE ankle/foot grades 2-3     Seated rearfoot clearance- bias the calcaneal INV and EV plantar glides grades 2-3  3 mins  3 mins  3 mins      MODALITIES: ( minutes):         Therapy in order to provide analgesia, relieve muscle spasm and reduce inflammation and edema. Hot pack was placed over the plantar fascia and posterior aspect of LLE calf musculature. Pt skin was inspected pre and post modality no abnormalities seen.            Treatment/Session Summary:    Treatment Assessment:   See discharge summary     Communication/Consultation:  None today  Equipment provided today:  None  Recommendations/Intent for next treatment session: Next visit will focus on BLE strengthening, BLE ROM and WB progressions/stamina training.    >Total Treatment Billable Duration:  40 minutes   Time In: 0930  Time Out: 1011     Cathlyn Searing, PT         Charge Capture  Events  MedBridge Portal  Appt Desk  Attendance Report     Future Appointments   Date Time Provider Department Center   06/20/2024  9:00  AM Jana Vernell Slocumb, PT [facility]   09/20/2024  8:50 AM PST LAB  PST [facility] ECC DEP   09/30/2024  8:40 AM Ethyl Elspeth HERO, MD PST [facility] ECC DEP   11/22/2024  1:00 PM [facility]HERAL [facility]   11/22/2024  2:00 PM Mertha Garnette DEL, MD [facility] AM[facility]   05/22/2025  7:40 AM [facility]   05/22/2025  8:30 AM Alexandra Domino, APRN - NP [facility] AM[facility]       "

## 2024-06-11 NOTE — Therapy Discharge (Signed)
 "     Jill Kaufman  DOB: February 11, 1966  Primary: [employer] ([employer])  [place]: [employer] [place] [facility]  [address]  [place] [address]  Phone: [phone]  Fax: [phone] Plan Frequency: 2x a week for up to 90 days (potentially 1x a week for up to 90 days for pt preference)    Plan of Care/Certification Expiration Date: 06/24/24        Plan of Care/Certification Expiration Date:  Plan of Care/Certification Expiration Date: 06/24/24    Frequency/Duration: Plan Frequency: 2x a week for up to 90 days (potentially 1x a week for up to 90 days for pt preference)      Time In/Out:   Time In: 0930  Time Out: 1011      PT Visit Info:    Progress Note Counter: 9      Visit Count:  21                OUTPATIENT PHYSICAL THERAPY:             Discharge Summary 06/11/2024               Episode (BLE foot pain )         Treatment Diagnosis:     Pain in both feet  Medical/Referring Diagnosis:    Pain of both heels    Referring Physician:  Ethyl Elspeth HERO, MD    MD Orders:  PT Eval and Treat   Return MD Appt:    Date of Onset:      Allergies:  Codeine  Restrictions/Precautions:    None      Medications Last Reviewed: 06/11/2024     SUBJECTIVE   History of Injury/Illness (Reason for Referral):  Pt arrives today with BLE foot/ankle pain in both heels. Pt reports that her LLE foot pain > RLE foot pain. Pt reports that sedentary behavior causes her to have a lot of heel pain. Pt reports that walking prolonged distances also gives her a decent pain. Pt reports that her pain is in the posterior-medial aspect of the LLE>RLE. Pt reports some medial plantar fascia pain that does get exacerbated with prolonged WB.  Pt reports that her ADLs are minimally affected, she can perform most home duties without issues however does feel pain. Pt denies any numbness and tingling. Pt denies swelling. Pt denies any MOI or trauma that is related to this initial incident.      Patient Stated Goal(s):  would like to move w/o BLE foot pain    Initial Pain Level:  2/10   Post Session Pain Level: 2/10    Past Medical History/Comorbidities:   Ms. Langenfeld  has a past medical history of Breast cancer (HCC), GERD (gastroesophageal reflux disease), High cholesterol, History of therapeutic radiation, Nausea & vomiting, PONV (postoperative nausea and vomiting), and Thyroid disease.  Ms. Cardella  has a past surgical history that includes gyn; Breast lumpectomy (Right, 05/04/2018); Breast biopsy (Right, 05/04/2018); US  PLACE BREAST LOC DEVICE 1ST LESION RIGHT (Right, 05/04/2018); Hysterectomy; and Colonoscopy (N/A, 08/09/2021).  Social History/Living Environment:   Patient lives with their family    Prior Level of Function/Work/Activity:   Prior Level of Function: Independent      Learning:   Does the patient/guardian have any barriers to learning?: No barriers  Will there be a co-learner?: No  What is the preferred language of the patient/guardian?: English  Is an interpreter required?:  No  How does the patient/guardian prefer to learn new concepts?: Listening; Demonstration    Fall Risk Scale:   Morse Total Score: 0       OBJECTIVE     NATURE OF CONDITION Date: 12/28/23 Date: 03/26/24   Date:05/28/24     Highest level of pain 7 5 3    Lowest level of pain 2 1 1    Aggravating factors Activity use/WB  Activity use/WB  Activity use/WB    Alleviating factors rest rest rest   Frequency of symptoms Everyday  Intermittent  Intermittent    Description of symptoms Soreness/ache Soreness/ache Soreness/ache   Location of tenderness Plantar aspect BLE heels Plantar aspect BLE heels Plantar aspect BLE heels     RANGE OF MOTION Date: 12/28/23 Date:  03/26/24 Date:05/28/24       RIGHT LEFT RIGHT LEFT RIGHT LEFT   Ankle Eversion  15 21 20  35     Ankle Inversion 25 15 40 35     Ankle Dorsiflexion 15 11 16 16      Ankle Plantarflexion WNL WNL WNL WNL WNL WNL     Strength Date: 12/28/23 Date:  03/26/24 Date:05/28/24        RIGHT LEFT RIGHT LEFT RIGHT LEFT   Ankle Eversion  3- 3- 3+ 3+ 4- 4-   Ankle Inversion 3- 3- 3+ 3+ 4- 4-   Ankle Dorsiflexion 3- 3- 3+ 3+ 4- 4-   Ankle Plantarflexion 3- 3- 3+ 3+ 4- 4-        NEUROLOGICAL SCREEN  Dermatomes: WNL  Reflexes: WNL   Outcome Measure:   Tool Used: FOOT AND ANKLE ABILITY MEASURE  Score:  Initial: 57 Most Recent: 95 (Date: 03/26/24 ) Most Recent: 95 (date:05/28/24)   Interpretation of Score: For the Activities of Daily Living, there are 21 questions each scored on a 5 point scale with 0 representing Unable to do and 4 representing No difficulty.  The lower the score, the greater the functional disability. 84/84 represents no disability.  Minimal detectable change is 5.7 points.  With the addition of the 8 questions in the Sports Subscale, there are 29 questions, each scored on a 5 point scale with 0 representing Unable to do and 4 representing No difficulty.  The lower the score, the greater the functional disability. 116/116 represents no disability.  Minimal detectable change is 12.3 points.    ASSESSMENT   Initial Assessment:  Pt is a 58 yo female who presents to the OPPT clinic with BLE foot/ankle pain. Pt has received Dx imaging via xray which revealed, Calcaneal spur and minimal degenerative change in the first toe MTP joint. Otherwise unremarkable foot radiographs. 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  Pt would benefit from skilled PT in order to address her impairments.     Pt has reached her first progress visit, Pt has attended 10 PT visits while completing 5/8 DC goals, with the remaining goals in progress towards completion. Pt is being seen for BLE foot/ankle pain. Pt has made progress with BLE strength, BLE WB stamina, and improved pain intensity. Pt still reports intermittent pain at times, intermittent stiffness and pain at times when taking her first steps with WB.  Pt would benefit to continue with skilled PT in order to address her remaining impairments. Pt;s PT POC has been extended to, 06/24/24    Pt has reached her second progress visit, Pt has attended 18 PT visits while  completing 6/8 DC goals, with the remaining goals in progress towards completion. Pt is being seen for BLE foot/ankle pain. Pt has made progress with pain, ROM, increased WB tolerance and improved ambulatory distances. Pt still reports pain at times in the LLE medial heel with prolonged WB, stiffness at times and some discomfort with prolonged standing. Pt would benefit to continue with skilled PT in order to address her remaining impairments.    Pt currently is being discharged from formal PT services. Pt has attended 21 PT visits while completing 8/8 DC goals. Pt is being seen for BLE foot/ankle pain.  Pt has made progress with ROM, pain modulation, ADLs, increased BLE strength and function. Pt currently has reached his maximum rehab potential. Therefore, Mrs. Beckett will be officially discharged from formal PT services. Thank you for this referral.      Therapy Problem List: (Impacting functional limitations):    Increased Pain, Decreased Strength, Decreased ROM, Decreased Functional Mobility, Decreased Independence with Home Exercise Program, Decreased Posture, Decreased Body Mechanics, and Decreased Activity Tolerance/Endurance*   Therapy Prognosis:   Good     Initial Assessment Complexity:   Moderate Complexity       PLAN   Effective Dates: 03/26/24 TO Plan of Care/Certification Expiration Date: 06/24/24     Frequency/Duration: Plan Frequency: 2x a week for up to 90 days (potentially 1x a week for up to 90 days for pt preference)      Interventions Planned (Treatment may consist of any combination of the following):    Home Exercise Program (HEP), Manual Therapy, Neuromuscular Re-education/Strengthening, Pain Management, Positioning, Range of Motion (ROM), Therapeutic Activites, and Therapeutic  Exercise/Strengthening   Goals: (Goals have been discussed and agreed upon with patient.)  Short-Term Functional Goals: Time Frame: 4 weeks   Pt will achieve a 65/116 FAAM score in order to improve ADLs and function MET  Pt will be compliant with HEP MET  Pt will achieve a VAS pain score of 5/10 in order to improve ADLS and function MET  Pt will improve all BLE Strength to 4-/5 in order to improve ADLs and function MET  Discharge Goals: Time Frame: 8 weeks   Pt will achieve a 75/116 FAAM score in order to improve ADLs and function MET  Pt will be independent with HEP MET  Pt will achieve a VAS pain score of 0/10 in order to improve ADLS and function MET  Pt will improve all BLE Strength to 5/5 in order to improve ADLs and function MET         Medical Necessity:   > Skilled intervention continues to be required due to Skilled Physical Therapy services are needed for the pt's deficits above that affect the pt to perform ADLs, participate within their community, and to perform functional movement patterns.    Reason For Services/Other Comments:  > Patient continues to require skilled intervention due to Skilled Physical Therapy services are needed for the pt's deficits above that affect the pt to perform ADLs, participate within their community, and to perform functional movement patterns.      Regarding Corra Thornton Southwood's therapy, I certify that the treatment plan above will be carried out by a therapist or under their direction.  Thank you for this referral,  Cathlyn Searing, PT     Referring Physician Signature: Ethyl Elspeth HERO, MD No Signature is Required for this note.        Charge Capture  Events  Appt Desk  Attendance Report        "

## 2024-06-18 ENCOUNTER — Inpatient Hospital Stay: Admit: 2024-06-18 | Payer: PRIVATE HEALTH INSURANCE | Primary: Family Medicine

## 2024-06-18 DIAGNOSIS — M25611 Stiffness of right shoulder, not elsewhere classified: Secondary | ICD-10-CM

## 2024-06-18 NOTE — Progress Notes (Signed)
 Jill Kaufman  DOB: 09-09-1965  Primary: [employer] ([employer])  [place]: [place] [facility]  [address]  [address]  [place] [address]  Phone: [phone]  Fax: [phone] Plan Frequency: 2x/wk x 90days    Plan of Care/Certification Expiration Date: 09/16/24 (pt prefers to begin at 1x/wk)        Plan of Care/Certification Expiration Date:  Plan of Care/Certification Expiration Date: 09/16/24 (pt prefers to begin at 1x/wk)    Frequency/Duration: Plan Frequency: 2x/wk x 90days      Time In/Out:   Time In: 0909  Time Out: 0958      PT Visit Info:    Total # of Visits to Date: 1  Progress Note Counter: 1      Visit Count:  1       OUTPATIENT PHYSICAL THERAPY:   Treatment Note 06/18/2024       Episode  (Oncology Rehab)               Treatment Diagnosis:    Shoulder stiffness, right  Other specified personal risk factors, not elsewhere classified  Other abnormalities of gait and mobility  Medical/Referring Diagnosis:    M25.50 (ICD-10-CM) - Arthralgia, unspecified joint    Referring Provider:  Alexandra Domino, APRN - NP  Orders:  PT Eval and Treat   Return Appt:     Date of Onset:    Allergies:   Codeine  Restrictions/Precautions:   Osteopenia  No BP R UE      Interventions Planned (Treatment may consist of any combination of the following):     See Assessment Note    Reason For Treatment: Initial Assessment:    Patient presents for oncology rehab following diagnosis and treatment of right breast cancer. She underwent lumpectomy then XRT for right breast cancer (Grade 2; ER+/HER2-). Number of nodes removed is unknown. She was on Tamoxifen  x 5 years and Arimidex  for about one year. She has developed pain in multiple joints contributing to ROM, strength, balance, and mobility limitations. She is at risk for lymphedema but has no signs or symptoms of lymphedema at this time. She has developed joint pain in multiple areas. Some of her testing was  limited due to left knee pain.    She would like to return to exercising and walking without pain. She has no subjective complaints of heaviness, achiness, fatigue, or tingling in her right UE. She will benefit from therapeutic exercises for ROM and strength, balance, and lymphedema education. Educated pt on LD risk reduction and provided her handouts regarding this. Pt will likely benefit from physical therapy to address their impairments and function limitations.    Functional Limitations Reported at Initial Eval: difficulty with pain/difficulty with walking (even short distances), and exercising. Pain is worst when she first stands after prolonged sitting.     Subjective Comments: see eval.    Total # of Visits: 1    Initial Pain Level::     6/10   Post Session Pain Level:       6/10  Medications Last Reviewed:  06/18/2024  Updated Objective Findings:  See Evaluation Note from today  Treatment   ____________________________________________________________________________  Neuromuscular Re-education:   Exercise/activities per grid below in red to improve balance, coordination, posture, and proprioception. Includes verbal and manual cues for muscle activation and postural control.        Activity/Exercise Parameters Parameters          Self-Care  Management: Self-Care Management training includes education and training in activities that support the daily living needs, such as safety procedures, use of adaptive or protective equipments, energy conservation, and strategies for managing a health condition.   Patient was educated extensively on lymphedema self-management strategies to promote safety, independence, and reduce risk of complications. Education included:  Patient was educated on strategies and the importance of monitoring her perceived rate of exertion (RPE) during activity to help manage energy levels and reduce the risk of cancer-related fatigue.   Precautionary guidelines: blood pressure cuffs,  venipuncture, and injury to the affected limb.  Lifestyle and safety considerations: temperature precautions, infection risk reduction, and early symptom reporting.  Educated pt on how to self-regulate amount of stress on lymphatic system and avoid exceeding the transport capacity.   Educated pt on LD risk reduction and provided her handouts regarding this.Reviewed decreasing lymphedema risk and monitoring for symptoms of pain, heaviness, or aching. Pt was educated on lymphedema prevention the purpose of oncology rehab, self scar mobilization, postural correction, and activity modifications for her home exercise routine. Educated pt how to avoid protective posturing and positions that put restrictions on the lymphatic system.  Pt was educated on initial HEP safety, exercise frequency and duration, symptom control, mechanics, and anatomy and physiology of present condition.   This education required skilled instruction to ensure patient understanding and ability to apply strategies appropriately. Intervention addressed patient's knowledge deficits and facilitated independence in self-care for long-term lymphedema management.   06/19/23    Activity/Exercise Parameters Parameters   Self-care As above      Therapeutic Activities:   Therapeutic activities per grid below including dynamic activities and education to improve functional performance, mobility, strength, and coordination.          Activity/Exercise Parameters Parameters          Therapeutic Exercise:   Exercises per grid below to improve mobility, strength, and coordination. Required minimal verbal, manual, and tactile cues to promote proper body alignment, posture, and body mechanics. Progress resistance, repetitions, and complexity of movement as indicated.     06/19/23    Activity/Exercise Parameters Parameters   Stance with FT and head turns H/V 30s ea    SLS 30s ea    Tandem walking EO/EC 31ft x 2ea    Single HR 20x B                               Abbreviations: VC = verbal cues; MC = manual cues; FS = foam surface; HH = hand hold; HS = hard surface; FT = feet together, EO = eyes open, EC = eyes closed, H = Horizontal, V = vertical, D = diagonal, HAFT = Heaviness/Aching/Fatigue/Tingling    HEP (see code below):    Band colors given to pt: []  Orange (#1)         []  Lime (#2)          []  Blue (#3)          []  Purple (#4)          []  Grey (#5)     Manual Therapy: none    Treatment/Session Summary:    Treatment Assessment:   Pt demonstrated good tolerance to first visit with introduction to therapeutic exercises and activities to simulate functional limitations.    Communication/Consultation:  Therapy Evaluation sent to referring provider  Equipment provided today:  HEP  Recommendations/Intent for next treatment session: Next  visit will focus on advancements to more challenging activities. Progress repetitions, weight, and complexity of functional movement per pt tolerance and as indicated.    >Total Treatment Billable Duration: 24 minutes + eval (untimed)  Time In: 0909  Time Out: 0958    Neuro Re-ed: (0 minutes)  Self-Care: (14 minutes)  Ther-Act: (10 minutes)  Ther-Ex: (0 minutes)  Manual Therapy: (0 minutes)      Vernell Jarold Mayor, PT, DPT      Charge Capture  MedBridge Portal  Appt Desk   Attendance Report     Future Appointments   Date Time Provider Department Center   06/27/2024  8:00 AM Mayor Vernell Jarold, PT [facility]   07/03/2024  8:00 AM Rease Wence, Vernell Jarold, PT [facility]   07/09/2024  8:00 AM Clarivel Callaway, Vernell Jarold, PT [facility]   07/16/2024  8:00 AM Brighid Koch, Vernell Jarold, PT [facility]   07/23/2024  8:00 AM Mayor Vernell Jarold, PT [facility]   07/30/2024  8:00 AM Mayor Vernell Jarold, PT [facility]   09/20/2024  8:50 AM PST LAB [facility] DEP   09/30/2024  8:40 AM Ethyl Elspeth HERO, MD PST [facility] DEP   11/22/2024  1:00 PM PERIPHERAL [facility] GCC   11/22/2024  2:00 PM Mertha Garnette DEL, MD [facility] AMB   05/22/2025  7:40 AM PERIPHERAL  [facility] GCC   05/22/2025  8:30 AM Alexandra Domino, APRN - NP [facility]     Access Code: [email or web address]  URL: [email or web address]  Date: 06/18/2024  Prepared by: Vernell Mayor    Exercises  - Sidelying Hip Abduction  - 1 x daily - 2 x weekly - 3 sets - 10 reps  - Doorway Pec Stretch at 60 Degrees Abduction with Arm Straight  - 1 x daily - 7 x weekly - 2 sets - 2 reps - 30s hold  - Sidelying Thoracic Rotation with Open Book  - 1 x daily - 7 x weekly - 20 reps - 3 hold

## 2024-06-18 NOTE — Therapy Evaluation (Addendum)
 Jill Kaufman  DOB: 11-12-1965  Primary: [employer] ([employer])  Secondary: [place] [facility]  [address]  [address]  [place] [address]  Phone: [phone]  Fax: [phone] Plan Frequency: 2x/wk x 90days  Plan of Care/Certification Expiration Date: 09/16/24 (pt prefers to begin at 1x/wk)     Events     Plan of Care/Certification Expiration Date:  Plan of Care/Certification Expiration Date: 09/16/24 (pt prefers to begin at 1x/wk)    Frequency/Duration: Plan Frequency: 2x/wk x 90days      Time In/Out:   Time In: 0909  Time Out: 0958      PT Visit Info:    Total # of Visits to Date: 1  Progress Note Counter: 1      Visit Count:  1                OUTPATIENT PHYSICAL THERAPY:             Initial Assessment 06/18/2024               Episode (Oncology Rehab)         Treatment Diagnosis:    Shoulder stiffness, right  Other specified personal risk factors, not elsewhere classified  Other abnormalities of gait and mobility  Medical/Referring Diagnosis:    M25.50 (ICD-10-CM) - Arthralgia, unspecified joint    Referring Provider:  Alexandra Domino, APRN - NP  Orders:  PT Eval and Treat   Return Appt:    Date of Onset:      Allergies:  Codeine  Restrictions/Precautions:    Osteopenia  No BP R UE       Medications Last Reviewed:  06/18/2024     SUBJECTIVE   History of Injury/Illness (Reason for Referral):  Pt is a right handed 59 yo female referred to physical therapy for oncology rehab. She underwent lumpectomy then XRT for right breast cancer (Grade 2; ER+/HER2-). She was on Tamoxifen  x 5 years and Arimidex  for about one year. She has developed ROM and strength limitations. She is at risk for lymphedema. She has developed joint aches in multiple areas. She recently completed PT for unrelated bilateral foot pain. She did not have chemo. She says the pain in her feet is much better now, but she has pain in most of her other joints.     Pain/Symptom  Location: B hips, right knee, ankles, shoulders, and fingers          Aggravating Factors/ Functional limitations: pain/difficulty with walking (even short distances), and exercising. Pain is worst when she first stands after prolonged sitting.     Falls: none    Patient Stated Goal(s):  Pt is hoping to be able to function without limitation due to limited ROM.  Initial Pain Level:      6/10   Post Session Pain Level:     6/10  Past Medical History/Comorbidities:   Jill Kaufman  has a past medical history of Breast cancer (HCC), GERD (gastroesophageal reflux disease), High cholesterol, History of therapeutic radiation, Nausea & vomiting, PONV (postoperative nausea and vomiting), and Thyroid disease.  Jill Kaufman  has a past surgical history that includes gyn; Breast lumpectomy (Right, 05/04/2018); Breast biopsy (Right, 05/04/2018); US  PLACE BREAST LOC DEVICE 1ST LESION RIGHT (Right, 05/04/2018); Hysterectomy; and Colonoscopy (N/A, 08/09/2021).  Social History/Living Environment: Pt lives in a single level home with no steps to enter.  Prior Level of Function/Work/Activity: investment banker, corporate   Learning:  Does the patient/guardian have any barriers to learning?: No barriers  Will there be a co-learner?: No  What is the preferred language of the patient/guardian?: English  Is an interpreter required?: No  How does the patient/guardian prefer to learn new concepts?: Listening; Reading; Demonstration; Pictures/Videos  Fall Risk Scale:   Morse Total Score: 15       OBJECTIVE     Posture: elevated and protracted scapulas bilaterally; right humerus sits anteriorly      Gait: no unsteadiness on flat surface; bilateral pronation, inc right toe out; hip drop in right stance    Surgical Scar(s): healing well with no sign of infection  ________________________________________________________________________________________________  Range of Motion      Shoulder:   Left Right*    AROM PROM AROM PROM   Flexion 160  160     Abduction 120  115    ER       IR         Circumferential measurements L UE (cm) R UE (cm)*   Date: 06/18/24   MCPs     Wrist (radial styloid) 16.5 16.5   10 cm 20.5 20.3   20 cm 24.5 24.4   30 cm 28 27   40 cm 32 32.3   Volume (mL)                 *Measurements were taken every 10cm. ________________________________________________________________________________________________  Strength        Upper Extremity    Joint:      LEFT (out of 5) RIGHT (out of 5)*   Shoulder Flexion     Shoulder Extension     Shoulder Abduction     Shoulder Internal Rotation     Shoulder External Rotation 4+ 4+   Elbow Flexion     Elbow Extension     Grip Dynamometry L2 50 lbs 45 lbs       Lower Extremity  Joint:         LEFT (out of 5) RIGHT (out of 5)   Hip Flexion 4 4   Hip Extension       Hip Internal Rotation       Hip External Rotation       Hip Abduction 4 4+   Knee flexion     Knee extension     Ankle DF     ________________________________________________________________________________________________  Neruo-Vascular       Sensation: normal light touch screen     Balance: dnt at initial eval (see below)   Left (seconds) Right (seconds)   Rhomberg (FT)    SemiTandem     Tandem     Single Limb Stance     ________________________________________________________________________________________________    Adult Functional Independent Test (A-FIT): (modified and performed tests specific to patient)  Test Reference Standard Results Significance of Findings/Comments   Posture  Date: 06/19/23  (Abnormal results in bold)       Forward Head:     Wall Occiput Distance > 0-4 cm = poor posture  > 4 cm = fracture risk dnt Head and upper back posture can contribute to vertebral compression fractures.      Thoracic Extension:      Rib-Pelvis Distance < 2 fingers = fracture risk dnt    Flexibility         Shoulder:     Back Scratch Risk = Men: > 8 inches             Women: > 4 inches dnt  Decreased shoulder ROM indicates a potential for increased  disability.      Ankle Dorsiflexion:     Range of Motion (ROM) Risk of falls: < 8 with knee straight or < 10 with knee bent dnt Decreased ankle ROM can increase falls.   Balance         Static Balance (SB): (feet together)     Vestibular Hypofunction Risk of injurious falls: < 10 second tolerance for head turns No symptoms Inability to move one's head quickly without dizziness or loss of balance can increase the risk of injurious falls.    Risk of falls: < 10 second tolerance for up/down head motion No symptoms       SB: One Leg Stand < 5 seconds = increased risk of injurious falls  > 20 seconds is normal L 5 seconds  R 15 seconds Inability to stand on one leg for 5 seconds increases risk of injurious falls; < 20 seconds indicates lower body weakness.      Dynamic Balance (DB):     TUG Age     Seconds  Age     Seconds  30-39   4.4 sec    60-69   5.6 sec  40-49   4.6 sec    70-79   6.7 sec  50-59   4.9 sec    80-89   7.8 sec dnt Increased risk of falls if score is more than 14 seconds.  > 9 seconds was shown to predict disability in the next 2 years.      DB Tandem Walk          eyes open   Risk: > 2 errors in 2 meters 0 errors Increased risk of falls if more than 2 errors in 2 meters eyes open OR inability to take 5 steps eyes closed.           eyes closed Risk: < 5 steps 3 steps    Endurance         2 Minute Step Age            Men     Women  60-64          106          97  65-69          101          93  70-74          95            89  75-79          88            84  80-84          80            78  85-89          71            70  90-94          60            60 dnt Scoring below one's norms indicates decreased endurance, which indicates a potential for increased disability.   Strength         Grip (Dynamometer) Age     Hand  Men (kg)  Women (kg)  60-64      R       40.69           24.99  L       34.84           20.73  65-69      R       41.32           22.50                  L        34.84           18.60  70-74      R       34.16           22.50                  L       29.39           18.82  75-79      R       33.00           21.60                  L       31.10           19.30  80-84      R       30.10           17.30                  L       27.00           17.10  85-89      R       25.80           17.10                  L       25.10           15.70  90-94      R       18.80           15.20                  L       18.90           14.80 R: 50 kgs of force  L: 45 kgs of force Scoring below one's norms indicates a potential for increased disability and mortality.      Shoulder External Rotators Age            Men (lbs)     Women (lbs)  60-69               34                   20  70-79               32                   19 R: 4+/5  L: 4+/5 Shoulder external rotator weakness is a precursor to rotator cuff problems.      Plantarflexors (single HR with BUE support) < 25 times = increased fall risk R: 18 times (R knee pain)  L: 20 times Less than the norm is associated with increased risk of falling.      Lower Extremity:      Sit to Stand   30 Seconds Sit to Stand  Age  Men     Women  60-64           17           15  65-69           16           15  70-74           15           14  75-79           14           13  80-84           13           12  85-89           11            11  90-94            9             9 Dnt (R knee pain)   Lower extremity strength can affect walking, daily activity and fall risk.  > 10 seconds on the 5 times sit to stand has been shown to predict disability within the next 2 years.      Abdominals Increased risk of back pain:          Plank: < 73 second  OR  Curl-up: < 183 seconds for men                       < 85 seconds for women Plank: dnt seconds  Curl-up: dnt seconds Plank, Curl up and Prone hold strengthen the core.  Weakness in the core can lead to back problems.      Back Extension Increased risk of back pain:          Prone hold: < 208 seconds for  men                             < 128 seconds for women dnt    Copyright 2023 [employer]. All rights reserved.    ASSESSMENT   Initial Assessment:    Patient presents for oncology rehab following diagnosis and treatment of right breast cancer. She underwent lumpectomy then XRT for right breast cancer (Grade 2; ER+/HER2-). Number of nodes removed is unknown. She was on Tamoxifen  x 5 years and Arimidex  for about one year. She has developed pain in multiple joints contributing to ROM, strength, balance, and mobility limitations. She is at risk for lymphedema but has no signs or symptoms of lymphedema at this time. She has developed joint pain in multiple areas. Some of her testing was limited due to left knee pain.    She would like to return to exercising and walking without pain. She has no subjective complaints of heaviness, achiness, fatigue, or tingling in her right UE. She will benefit from therapeutic exercises for ROM and strength, balance, and lymphedema education. Educated pt on LD risk reduction and provided her handouts regarding this. Pt will likely benefit from physical therapy to address their impairments and function limitations.    The functional deficits are as follows: difficulty with pain/difficulty with walking (even short distances), and exercising. Pain is worst when she first stands after prolonged sitting.     Therapy Problem List: (Impacting functional limitations):    Decreased functional mobility ;Decreased ROM;Decreased body  mechanics;Decreased strength;Decreased endurance;Decreased coordination;Increased pain;Decreased posture;  Therapy Prognosis:   Therapy Prognosis: Good  Initial Assessment Complexity:   Decision Making: Medium Complexity    PLAN   Effective Dates: 06/18/2024 TO Plan of Care/Certification Expiration Date: 09/16/24 (pt prefers to begin at 1x/wk)     Frequency/Duration: Plan Frequency: 2x/wk x 90days      Interventions Planned (Treatment may consist of any  combination of the following):    Strengthening;ROM;Functional mobility training;Transfer training;Endurance training;Neuromuscular re-education;Manual;Pain management;Home exercise program;Safety education & training;Patient/Caregiver education & training;Modalities;Positioning;Therapeutic activities;     GOALS: (Goals have been discussed and agreed upon with patient.)  Short Term Goals: Time Frame: 6 weeks  PT will be compliant with initial HEP.   Pt will decrease worst pain to 4/10 with functional activities.  The patient will have knowledge of signs and symptoms of lymphedema to reduce the risk of lymphedema.  Pt will reduce DASH to 15% or less to demonstrate improvement in overall self reported function.    GOALS: (Goals have been discussed and agreed upon with patient.)  Discharge Goals: Time Frame: 12 weeks  PT will be independent with final HEP.   Pt will decrease worst pain to 1/10 with all functional activities.  Pt will reduce DASH to 10% or less to demonstrate improvement in overall self reported function.  Pt will demonstrate an improvement in balance by increasing SLS time to 30s+ bilaterally.  Pt will demonstrate a reduction in fall risk by increasing single HR to 25+ times.  Pt will be able to function while monitoring for signs of exceeding trasport capacity to lower lymphedema risk.         Tool Used: Disabilities of the Arm, Shoulder and Hand (DASH) Questionnaire - Quick Version  Score:  Initial: 21%  Most Recent: X% (Date: -- )   Interpretation of Score: The DASH is designed to measure the activities of daily living in person's with upper extremity dysfunction or pain.  Each section is scored on a 1-5 scale, 5 representing the greatest disability.  The scores of each section are added together for a total score of 55.      Medical Necessity:   Skilled intervention is required due to decreased strength, ROM, and functional mobility.  Reason for Services/Other Comments:  Patient requires skilled  intervention due to decreased strength, and ROM with increased pain affecting pt functional mobility.    Regarding Jill Kaufman's therapy, I certify that the treatment plan above will be carried out by a therapist or under their direction.  Thank you for this referral,  Vernell Jarold Mayor, PT, DPT, COMT, CLT     Referring Provider Signature: Alexandra Domino, APRN* _______________________________ Date _____________        Charge Capture  Events  Appt Desk  Attendance Report

## 2024-06-27 ENCOUNTER — Encounter: Payer: PRIVATE HEALTH INSURANCE | Primary: Family Medicine

## 2024-06-27 ENCOUNTER — Ambulatory Visit: Admitting: Cardiovascular Disease

## 2024-07-03 ENCOUNTER — Inpatient Hospital Stay: Admit: 2024-07-03 | Payer: PRIVATE HEALTH INSURANCE | Primary: Family Medicine

## 2024-07-03 NOTE — Progress Notes (Signed)
 Jill Kaufman  DOB: 01-11-1966  Primary: [employer])  Secondary: [employer] [place] [facility] [facility]  [address]  [id number]  [place] [address]  Phone: [phone]  Fax: [phone] Plan Frequency: 2x/wk x 90days    Plan of Care/Certification Expiration Date: 09/16/24 (pt prefers to begin at 1x/wk)        Plan of Care/Certification Expiration Date:  Plan of Care/Certification Expiration Date: 09/16/24 (pt prefers to begin at 1x/wk)    Frequency/Duration: Plan Frequency: 2x/wk x 90days      Time In/Out:   Time In: 0801  Time Out: 0855  Minutes: 54      PT Visit Info:    Total # of Visits to Date: 2  Progress Note Counter: 2      Visit Count:  2    OUTPATIENT PHYSICAL THERAPY:   Treatment Note 07/03/2024       Episode  (Oncology Rehab)               Treatment Diagnosis:    Shoulder stiffness, right  Other specified personal risk factors, not elsewhere classified  Other abnormalities of gait and mobility  Medical/Referring Diagnosis:    M25.50 (ICD-10-CM) - Arthralgia, unspecified joint    Referring Provider:  Alexandra Domino, APRN - NP  Orders:  PT Eval and Treat   Return Appt:     Date of Onset:    Allergies:   Codeine  Restrictions/Precautions:   Osteopenia  No BP R UE      Interventions Planned (Treatment may consist of any combination of the following):     See Assessment Note    Reason For Treatment: Initial Assessment:    Patient presents for oncology rehab following diagnosis and treatment of right breast cancer. She underwent lumpectomy then XRT for right breast cancer (Grade 2; ER+/HER2-). Number of nodes removed is unknown. She was on Tamoxifen  x 5 years and Arimidex  for about one year. She has developed pain in multiple joints contributing to ROM, strength, balance, and mobility limitations. She is at risk for lymphedema but has no signs or symptoms of lymphedema at this time. She has developed joint pain in multiple areas. Some of her  testing was limited due to left knee pain.    She would like to return to exercising and walking without pain. She has no subjective complaints of heaviness, achiness, fatigue, or tingling in her right UE. She will benefit from therapeutic exercises for ROM and strength, balance, and lymphedema education. Educated pt on LD risk reduction and provided her handouts regarding this.     Functional Limitations Reported at Initial Eval: difficulty with pain/difficulty with walking (even short distances), and exercising. Pain is worst when she first stands after prolonged sitting.     Subjective Comments: Pt says she is doing pretty good today. She is compliant with her HEP.     Total # of Visits: 2    Initial Pain Level::     0/10   Post Session Pain Level:       0/10  Medications Last Reviewed:  07/03/2024  Updated Objective Findings:  None Today  Treatment     Therapeutic Activities: (15 minutes)  Therapeutic activities per grid below including dynamic activities and education to improve functional performance, mobility, strength, and coordination.       07/03/24   Activity/Exercise Parameters Parameters   Stoop on HS  2 x 5    Stoop on FS  2 x 5   Stance on HS with EO then EC  From FT to ST  2 x 30s ea B   Stance on foam with EO then EC  2 x 30s ea B   Stance on foam with EO then EC  With head turns H/V 2 x 30s ea B   ST stance on FS with UTR  2 x 30s ea B   ST stance on FS with ball taps to wall  2 x 30s ea B     Therapeutic Exercise: (39 minutes)  Exercises per grid below to improve mobility, strength, and coordination. Required minimal verbal, manual, and tactile cues to promote proper body alignment, posture, and body mechanics. Progress resistance, repetitions, and complexity of movement as indicated.     06/19/23 07/03/24   Activity/Exercise Parameters Parameters   NuStep (supervised aerobic activity performed while discussing subjective pt report, functional progress, and current symptoms with a gradual increase in  intensity if indicated)  10 min L4   Stance with FT and head turns H/V 30s ea    SLS 30s ea    Tandem walking EO/EC 28ft x 2ea    Single HR 20x B    Bird dogs  Legs only then both 2 x 15   B hip IR with band at forefeet and block at knees  3 x 10    Dead bug heel taps  3 x 10    clams  2 x 15         Abbreviations: VC = verbal cues; MC = manual cues; FS = foam surface; HH = hand hold; HS = hard surface; FT = feet together, EO = eyes open, EC = eyes closed, H = Horizontal, V = vertical, D = diagonal, HAFT = Heaviness/Aching/Fatigue/Tingling    HEP (see code below):    Band colors given to pt: []  Orange (#1)         []  Lime (#2)          []  Blue (#3)          []  Purple (#4)          []  Grey (#5)       Treatment/Session Summary:    Treatment Assessment:   Pt demonstrated good tolerance to first follow up visit with progression of to therapeutic exercises and activities to simulate functional limitations. She had no pain with session.  Communication/Consultation:  None today  Equipment provided today:  None  Recommendations/Intent for next treatment session: Next visit will focus on advancements to more challenging activities. Progress repetitions, weight, and complexity of functional movement per pt tolerance and as indicated.    >Total Treatment Billable Duration: 54 minutes (39 TA / 15 TE)  Time In: 0801  Time Out: 0855     Vernell Jarold Mayor, PT, DPT    Charge Capture  Events  MedBridge Portal  Appt Desk  Attendance Report     Future Appointments   Date Time Provider Department Center   07/09/2024  8:00 AM Mayor Vernell Jarold, PT [facility]   07/16/2024  8:15 AM Mayor Vernell Jarold, PT [facility]   07/23/2024  8:15 AM Mayor Vernell Jarold, PT [facility]   07/30/2024  8:15 AM Mayor Vernell Jarold, PT [facility]   09/20/2024  8:50 AM PST LAB PST [facility]   09/30/2024  8:40 AM Ethyl Elspeth HERO, MD PST [facility]   11/22/2024  1:00 PM [facility]IPHERAL [facility]  11/22/2024  2:00 PM Mertha Garnette DEL, MD [facility]   05/22/2025  7:40 AM PERIPHERAL [facility]   05/22/2025  8:30 AM Alexandra Domino, APRN - NP [facility]          Access Code: [email or web address]  URL: [email or web address]  Date: 06/18/2024  Prepared by: Vernell Mayor    Exercises  - Sidelying Hip Abduction  - 1 x daily - 2 x weekly - 3 sets - 10 reps  - Doorway Pec Stretch at 60 Degrees Abduction with Arm Straight  - 1 x daily - 7 x weekly - 2 sets - 2 reps - 30s hold  - Sidelying Thoracic Rotation with Open Book  - 1 x daily - 7 x weekly - 20 reps - 3 hold

## 2024-07-09 ENCOUNTER — Encounter: Payer: PRIVATE HEALTH INSURANCE | Primary: Family Medicine

## 2024-07-16 ENCOUNTER — Inpatient Hospital Stay: Payer: PRIVATE HEALTH INSURANCE | Primary: Family Medicine

## 2024-07-23 ENCOUNTER — Encounter: Payer: PRIVATE HEALTH INSURANCE | Primary: Family Medicine

## 2024-07-30 ENCOUNTER — Encounter: Payer: PRIVATE HEALTH INSURANCE | Primary: Family Medicine
